# Patient Record
Sex: Female | Born: 1940
Health system: Southern US, Community
[De-identification: ages and names within clinical notes are randomized; demographics above are authoritative.]

## PROBLEM LIST (undated history)

## (undated) DIAGNOSIS — N189 Chronic kidney disease, unspecified: Secondary | ICD-10-CM

## (undated) DIAGNOSIS — F419 Anxiety disorder, unspecified: Secondary | ICD-10-CM

## (undated) DIAGNOSIS — R112 Nausea with vomiting, unspecified: Secondary | ICD-10-CM

## (undated) DIAGNOSIS — J189 Pneumonia, unspecified organism: Secondary | ICD-10-CM

## (undated) DIAGNOSIS — Z9889 Other specified postprocedural states: Secondary | ICD-10-CM

## (undated) DIAGNOSIS — R413 Other amnesia: Secondary | ICD-10-CM

## (undated) DIAGNOSIS — I6529 Occlusion and stenosis of unspecified carotid artery: Secondary | ICD-10-CM

## (undated) DIAGNOSIS — F32A Depression, unspecified: Secondary | ICD-10-CM

## (undated) DIAGNOSIS — F329 Major depressive disorder, single episode, unspecified: Secondary | ICD-10-CM

## (undated) DIAGNOSIS — M1990 Unspecified osteoarthritis, unspecified site: Secondary | ICD-10-CM

## (undated) DIAGNOSIS — R0600 Dyspnea, unspecified: Secondary | ICD-10-CM

## (undated) DIAGNOSIS — F0390 Unspecified dementia without behavioral disturbance: Secondary | ICD-10-CM

## (undated) DIAGNOSIS — I10 Essential (primary) hypertension: Secondary | ICD-10-CM

## (undated) DIAGNOSIS — J449 Chronic obstructive pulmonary disease, unspecified: Secondary | ICD-10-CM

## (undated) DIAGNOSIS — R011 Cardiac murmur, unspecified: Secondary | ICD-10-CM

## (undated) DIAGNOSIS — D649 Anemia, unspecified: Secondary | ICD-10-CM

## (undated) DIAGNOSIS — K635 Polyp of colon: Secondary | ICD-10-CM

## (undated) HISTORY — DX: Other amnesia: R41.3

## (undated) HISTORY — PX: CHOLECYSTECTOMY: SHX55

## (undated) HISTORY — PX: CAROTID ENDARTERECTOMY: SUR193

## (undated) HISTORY — DX: Occlusion and stenosis of unspecified carotid artery: I65.29

---

## 1997-10-03 ENCOUNTER — Other Ambulatory Visit: Admission: RE | Admit: 1997-10-03 | Discharge: 1997-10-03 | Payer: Self-pay | Admitting: Obstetrics and Gynecology

## 2000-09-22 ENCOUNTER — Ambulatory Visit (HOSPITAL_COMMUNITY): Admission: RE | Admit: 2000-09-22 | Discharge: 2000-09-22 | Payer: Self-pay | Admitting: Family Medicine

## 2000-09-22 ENCOUNTER — Encounter: Payer: Self-pay | Admitting: Family Medicine

## 2000-12-29 ENCOUNTER — Encounter: Payer: Self-pay | Admitting: Family Medicine

## 2000-12-29 ENCOUNTER — Ambulatory Visit (HOSPITAL_COMMUNITY): Admission: RE | Admit: 2000-12-29 | Discharge: 2000-12-29 | Payer: Self-pay | Admitting: Family Medicine

## 2001-01-08 ENCOUNTER — Ambulatory Visit (HOSPITAL_COMMUNITY): Admission: RE | Admit: 2001-01-08 | Discharge: 2001-01-08 | Payer: Self-pay | Admitting: Internal Medicine

## 2001-01-27 ENCOUNTER — Encounter: Payer: Self-pay | Admitting: Internal Medicine

## 2001-01-27 ENCOUNTER — Ambulatory Visit (HOSPITAL_COMMUNITY): Admission: RE | Admit: 2001-01-27 | Discharge: 2001-01-27 | Payer: Self-pay | Admitting: Internal Medicine

## 2002-02-28 ENCOUNTER — Encounter: Payer: Self-pay | Admitting: Obstetrics and Gynecology

## 2002-02-28 ENCOUNTER — Ambulatory Visit (HOSPITAL_COMMUNITY): Admission: RE | Admit: 2002-02-28 | Discharge: 2002-02-28 | Payer: Self-pay | Admitting: Obstetrics and Gynecology

## 2002-09-12 ENCOUNTER — Encounter: Payer: Self-pay | Admitting: Internal Medicine

## 2002-09-12 ENCOUNTER — Ambulatory Visit (HOSPITAL_COMMUNITY): Admission: RE | Admit: 2002-09-12 | Discharge: 2002-09-12 | Payer: Self-pay | Admitting: Internal Medicine

## 2002-09-13 ENCOUNTER — Ambulatory Visit (HOSPITAL_COMMUNITY): Admission: RE | Admit: 2002-09-13 | Discharge: 2002-09-13 | Payer: Self-pay | Admitting: Internal Medicine

## 2002-09-13 ENCOUNTER — Encounter: Payer: Self-pay | Admitting: Internal Medicine

## 2003-10-24 ENCOUNTER — Ambulatory Visit (HOSPITAL_COMMUNITY): Admission: RE | Admit: 2003-10-24 | Discharge: 2003-10-24 | Payer: Self-pay | Admitting: Family Medicine

## 2004-02-29 ENCOUNTER — Ambulatory Visit (HOSPITAL_COMMUNITY): Admission: RE | Admit: 2004-02-29 | Discharge: 2004-02-29 | Payer: Self-pay | Admitting: Obstetrics and Gynecology

## 2004-11-12 ENCOUNTER — Ambulatory Visit (HOSPITAL_COMMUNITY): Admission: RE | Admit: 2004-11-12 | Discharge: 2004-11-12 | Payer: Self-pay | Admitting: Family Medicine

## 2005-07-10 ENCOUNTER — Ambulatory Visit (HOSPITAL_COMMUNITY): Admission: RE | Admit: 2005-07-10 | Discharge: 2005-07-10 | Payer: Self-pay | Admitting: Family Medicine

## 2005-12-16 ENCOUNTER — Ambulatory Visit (HOSPITAL_COMMUNITY): Admission: RE | Admit: 2005-12-16 | Discharge: 2005-12-16 | Payer: Self-pay | Admitting: Family Medicine

## 2006-09-29 ENCOUNTER — Ambulatory Visit (HOSPITAL_COMMUNITY): Admission: RE | Admit: 2006-09-29 | Discharge: 2006-09-29 | Payer: Self-pay | Admitting: Obstetrics and Gynecology

## 2007-12-22 ENCOUNTER — Ambulatory Visit (HOSPITAL_COMMUNITY): Admission: RE | Admit: 2007-12-22 | Discharge: 2007-12-22 | Payer: Self-pay | Admitting: Family Medicine

## 2009-06-27 ENCOUNTER — Ambulatory Visit (HOSPITAL_COMMUNITY): Admission: RE | Admit: 2009-06-27 | Discharge: 2009-06-27 | Payer: Self-pay | Admitting: Family Medicine

## 2009-09-17 ENCOUNTER — Ambulatory Visit (HOSPITAL_COMMUNITY): Admission: RE | Admit: 2009-09-17 | Discharge: 2009-09-17 | Payer: Self-pay | Admitting: Internal Medicine

## 2010-03-05 ENCOUNTER — Ambulatory Visit (HOSPITAL_COMMUNITY): Admission: RE | Admit: 2010-03-05 | Discharge: 2010-03-05 | Payer: Self-pay | Admitting: Obstetrics and Gynecology

## 2010-04-23 ENCOUNTER — Emergency Department (HOSPITAL_COMMUNITY)
Admission: EM | Admit: 2010-04-23 | Discharge: 2010-04-23 | Payer: Self-pay | Source: Home / Self Care | Admitting: Emergency Medicine

## 2010-08-06 LAB — DIFFERENTIAL
Basophils Absolute: 0 10*3/uL (ref 0.0–0.1)
Eosinophils Absolute: 0 10*3/uL (ref 0.0–0.7)
Eosinophils Relative: 1 % (ref 0–5)
Lymphocytes Relative: 28 % (ref 12–46)
Neutrophils Relative %: 62 % (ref 43–77)

## 2010-08-06 LAB — CBC
HCT: 38.4 % (ref 36.0–46.0)
Hemoglobin: 13.8 g/dL (ref 12.0–15.0)
MCH: 31.2 pg (ref 26.0–34.0)
MCV: 86.7 fL (ref 78.0–100.0)
RBC: 4.43 MIL/uL (ref 3.87–5.11)

## 2010-08-06 LAB — COMPREHENSIVE METABOLIC PANEL
AST: 21 U/L (ref 0–37)
CO2: 22 mEq/L (ref 19–32)
Chloride: 106 mEq/L (ref 96–112)
Creatinine, Ser: 0.75 mg/dL (ref 0.4–1.2)
GFR calc Af Amer: >60 mL/min (ref >60)
GFR calc non Af Amer: >60 mL/min (ref >60)
Glucose, Bld: 100 mg/dL — ABNORMAL HIGH (ref 70–99)
Total Bilirubin: 0.7 mg/dL (ref 0.3–1.2)

## 2010-08-06 LAB — POCT CARDIAC MARKERS
CKMB, poc: <1 ng/mL — ABNORMAL LOW (ref 1.0–8.0)
Troponin i, poc: <0.05 ng/mL (ref 0.00–0.09)
Troponin i, poc: <0.05 ng/mL (ref 0.00–0.09)

## 2011-04-03 ENCOUNTER — Other Ambulatory Visit (HOSPITAL_COMMUNITY): Payer: Self-pay | Admitting: Family Medicine

## 2011-04-03 DIAGNOSIS — Z139 Encounter for screening, unspecified: Secondary | ICD-10-CM

## 2011-04-10 ENCOUNTER — Ambulatory Visit (HOSPITAL_COMMUNITY)
Admission: RE | Admit: 2011-04-10 | Discharge: 2011-04-10 | Disposition: A | Discharge status: Home / Self Care | Payer: Medicare Other | Source: Ambulatory Visit | Attending: Family Medicine | Admitting: Family Medicine

## 2011-04-10 DIAGNOSIS — Z1231 Encounter for screening mammogram for malignant neoplasm of breast: Secondary | ICD-10-CM | POA: Insufficient documentation

## 2011-04-10 DIAGNOSIS — Z139 Encounter for screening, unspecified: Secondary | ICD-10-CM

## 2011-06-10 ENCOUNTER — Encounter: Payer: Self-pay | Admitting: Internal Medicine

## 2011-09-30 ENCOUNTER — Other Ambulatory Visit (HOSPITAL_COMMUNITY): Payer: Self-pay | Admitting: Family Medicine

## 2011-09-30 DIAGNOSIS — Z01419 Encounter for gynecological examination (general) (routine) without abnormal findings: Secondary | ICD-10-CM

## 2011-09-30 DIAGNOSIS — Z139 Encounter for screening, unspecified: Secondary | ICD-10-CM

## 2011-10-03 ENCOUNTER — Encounter (INDEPENDENT_AMBULATORY_CARE_PROVIDER_SITE_OTHER): Payer: Self-pay | Admitting: *Deleted

## 2011-10-07 ENCOUNTER — Ambulatory Visit (HOSPITAL_COMMUNITY)
Admission: RE | Admit: 2011-10-07 | Discharge: 2011-10-07 | Disposition: A | Discharge status: Home / Self Care | Payer: Medicare Other | Source: Ambulatory Visit | Attending: Family Medicine | Admitting: Family Medicine

## 2011-10-07 DIAGNOSIS — M899 Disorder of bone, unspecified: Secondary | ICD-10-CM | POA: Insufficient documentation

## 2011-10-07 DIAGNOSIS — Z13820 Encounter for screening for osteoporosis: Secondary | ICD-10-CM | POA: Insufficient documentation

## 2011-10-07 DIAGNOSIS — Z01419 Encounter for gynecological examination (general) (routine) without abnormal findings: Secondary | ICD-10-CM

## 2011-10-07 DIAGNOSIS — Z139 Encounter for screening, unspecified: Secondary | ICD-10-CM

## 2011-10-07 DIAGNOSIS — F17200 Nicotine dependence, unspecified, uncomplicated: Secondary | ICD-10-CM | POA: Insufficient documentation

## 2011-10-07 DIAGNOSIS — M949 Disorder of cartilage, unspecified: Secondary | ICD-10-CM | POA: Insufficient documentation

## 2011-10-07 DIAGNOSIS — Z780 Asymptomatic menopausal state: Secondary | ICD-10-CM | POA: Insufficient documentation

## 2011-10-15 ENCOUNTER — Encounter (INDEPENDENT_AMBULATORY_CARE_PROVIDER_SITE_OTHER): Payer: Self-pay | Admitting: *Deleted

## 2011-10-15 ENCOUNTER — Telehealth (INDEPENDENT_AMBULATORY_CARE_PROVIDER_SITE_OTHER): Payer: Self-pay | Admitting: *Deleted

## 2011-10-15 ENCOUNTER — Other Ambulatory Visit (INDEPENDENT_AMBULATORY_CARE_PROVIDER_SITE_OTHER): Payer: Self-pay | Admitting: *Deleted

## 2011-10-15 DIAGNOSIS — Z1211 Encounter for screening for malignant neoplasm of colon: Secondary | ICD-10-CM

## 2011-10-15 MED ORDER — PEG-KCL-NACL-NASULF-NA ASC-C 100 G PO SOLR: 1.0000 | Freq: Once | ORAL | Status: DC

## 2011-10-15 NOTE — Telephone Encounter (Signed)
Patient needs movi prep 

## 2011-10-21 ENCOUNTER — Telehealth (INDEPENDENT_AMBULATORY_CARE_PROVIDER_SITE_OTHER): Payer: Self-pay | Admitting: *Deleted

## 2011-10-21 NOTE — Telephone Encounter (Signed)
PCP/Requesting MD: [email or web address]  Name & DOB: Sophia Coleman   08/25/1940     Procedure: tcs  Reason/Indication:  screening  Has patient had this procedure before?  yes  If so, when, by whom and where?  11 yrs ago  Is there a family history of colon cancer?  no  Who?  What age when diagnosed?    Is patient diabetic?   no      Does patient have prosthetic heart valve?  no  Do you have a pacemaker?  no  Has patient had joint replacement within last 12 months?  no  Is patient on Coumadin, Plavix and/or Aspirin? yes  Medications: asa 81 mg daily, losartan 100 mg daily, estradiol 5 mg daily, medroxyprogesterone 2.5 mg daily, paxil (generic) 20 mg every other day, alendronate 70 mg 1 tab weekly  Allergies: levaquin, pequin, augmentin  Medication Adjustment: asa 2 days  Procedure date & time: 11/13/11 at 1030

## 2011-10-23 NOTE — Telephone Encounter (Signed)
agree

## 2011-11-06 ENCOUNTER — Telehealth (INDEPENDENT_AMBULATORY_CARE_PROVIDER_SITE_OTHER): Payer: Self-pay | Admitting: *Deleted

## 2011-11-06 ENCOUNTER — Encounter (HOSPITAL_COMMUNITY): Payer: Self-pay | Admitting: Pharmacy Technician

## 2011-11-06 DIAGNOSIS — Z1211 Encounter for screening for malignant neoplasm of colon: Secondary | ICD-10-CM

## 2011-11-06 NOTE — Telephone Encounter (Signed)
Patient states pharmacy didn't get before, please resend

## 2011-11-07 MED ORDER — PEG-KCL-NACL-NASULF-NA ASC-C 100 G PO SOLR: 1.0000 | Freq: Once | ORAL | Status: DC

## 2011-11-12 MED ORDER — SODIUM CHLORIDE 0.45 % IV SOLN
Freq: Once | INTRAVENOUS | Status: AC
  Administered 2011-11-13: 1000 mL via INTRAVENOUS

## 2011-11-13 ENCOUNTER — Encounter (HOSPITAL_COMMUNITY): Payer: Self-pay | Admitting: *Deleted

## 2011-11-13 ENCOUNTER — Ambulatory Visit (HOSPITAL_COMMUNITY)
Admission: RE | Admit: 2011-11-13 | Discharge: 2011-11-13 | Disposition: A | Discharge status: Home Health | Payer: Medicare Other | Source: Ambulatory Visit | Attending: Internal Medicine | Admitting: Internal Medicine

## 2011-11-13 ENCOUNTER — Encounter (HOSPITAL_COMMUNITY)
Admission: RE | Disposition: A | Discharge status: Home Health | Payer: Self-pay | Source: Ambulatory Visit | Attending: Internal Medicine

## 2011-11-13 DIAGNOSIS — Z1211 Encounter for screening for malignant neoplasm of colon: Secondary | ICD-10-CM | POA: Insufficient documentation

## 2011-11-13 DIAGNOSIS — Z7982 Long term (current) use of aspirin: Secondary | ICD-10-CM | POA: Insufficient documentation

## 2011-11-13 DIAGNOSIS — Q2733 Arteriovenous malformation of digestive system vessel: Secondary | ICD-10-CM | POA: Insufficient documentation

## 2011-11-13 DIAGNOSIS — K5730 Diverticulosis of large intestine without perforation or abscess without bleeding: Secondary | ICD-10-CM

## 2011-11-13 DIAGNOSIS — D126 Benign neoplasm of colon, unspecified: Secondary | ICD-10-CM

## 2011-11-13 DIAGNOSIS — J4489 Other specified chronic obstructive pulmonary disease: Secondary | ICD-10-CM | POA: Insufficient documentation

## 2011-11-13 DIAGNOSIS — Z79899 Other long term (current) drug therapy: Secondary | ICD-10-CM | POA: Insufficient documentation

## 2011-11-13 DIAGNOSIS — J449 Chronic obstructive pulmonary disease, unspecified: Secondary | ICD-10-CM | POA: Insufficient documentation

## 2011-11-13 DIAGNOSIS — I10 Essential (primary) hypertension: Secondary | ICD-10-CM | POA: Insufficient documentation

## 2011-11-13 HISTORY — DX: Depression, unspecified: F32.A

## 2011-11-13 HISTORY — DX: Chronic kidney disease, unspecified: N18.9

## 2011-11-13 HISTORY — DX: Essential (primary) hypertension: I10

## 2011-11-13 HISTORY — DX: Unspecified osteoarthritis, unspecified site: M19.90

## 2011-11-13 HISTORY — DX: Major depressive disorder, single episode, unspecified: F32.9

## 2011-11-13 HISTORY — PX: COLONOSCOPY: SHX5424

## 2011-11-13 HISTORY — DX: Chronic obstructive pulmonary disease, unspecified: J44.9

## 2011-11-13 SURGERY — COLONOSCOPY
Anesthesia: Moderate Sedation

## 2011-11-13 MED ORDER — MIDAZOLAM HCL 5 MG/5ML IJ SOLN
INTRAMUSCULAR | Status: AC
  Filled 2011-11-13: qty 10

## 2011-11-13 MED ORDER — STERILE WATER FOR IRRIGATION IR SOLN
Status: DC | PRN
  Administered 2011-11-13: 11:00:00

## 2011-11-13 MED ORDER — MEPERIDINE HCL 50 MG/ML IJ SOLN
INTRAMUSCULAR | Status: AC
  Filled 2011-11-13: qty 1

## 2011-11-13 MED ORDER — MEPERIDINE HCL 50 MG/ML IJ SOLN
INTRAMUSCULAR | Status: DC | PRN
  Administered 2011-11-13 (×2): 25 mg via INTRAVENOUS

## 2011-11-13 MED ORDER — MIDAZOLAM HCL 5 MG/5ML IJ SOLN
INTRAMUSCULAR | Status: DC | PRN
  Administered 2011-11-13: 1 mg via INTRAVENOUS
  Administered 2011-11-13 (×2): 2 mg via INTRAVENOUS

## 2011-11-13 NOTE — H&P (Signed)
Sophia Coleman is an 71 y.o. female.   Chief Complaint: Patient is here for colonoscopy. HPI: Patient is 71 year old Caucasian female who is here for average risk screening colonoscopy. Patient's last exam was over 10 years ago. She denies abdominal pain change in her bowel habits or rectal bleeding. Her she did notice some bright blood after she finished prep. Family history is negative for colorectal carcinoma.  Past Medical History  Diagnosis Date  . Hypertension   . COPD (chronic obstructive pulmonary disease)   . Chronic kidney disease   . Depression   . Arthritis     Past Surgical History  Procedure Date  . Cholecystectomy     History reviewed. No pertinent family history. Social History:  reports that she has been smoking.  She does not have any smokeless tobacco history on file. She reports that she does not drink alcohol or use illicit drugs.  Allergies:  Allergies  Allergen Reactions  . Augmentin (Amoxicillin-Pot Clavulanate) Other (See Comments)    Reaction: severe sweats  . Quinolones Nausea And Vomiting    Medications Prior to Admission  Medication Sig Dispense Refill  . alendronate (FOSAMAX) 70 MG tablet Take 70 mg by mouth every 7 (seven) days. Take with a full glass of water on an empty stomach. On mondays      . Calcium Carbonate-Vitamin D (CALTRATE 600+D) 600-400 MG-UNIT per tablet Take 1 tablet by mouth every morning.      Marland Kitchen estradiol (ESTRACE) 0.5 MG tablet Take 0.5 mg by mouth every morning.      Marland Kitchen ibuprofen (ADVIL,MOTRIN) 200 MG tablet Take 200 mg by mouth every 6 (six) hours as needed. For pain      . losartan (COZAAR) 100 MG tablet Take 100 mg by mouth every morning.      . medroxyPROGESTERone (PROVERA) 2.5 MG tablet Take 2.5 mg by mouth every morning.      Marland Kitchen PARoxetine (PAXIL) 20 MG tablet Take 20 mg by mouth every morning.      . peg 3350 powder (MOVIPREP) 100 G SOLR Take 1 kit (100 g total) by mouth once.  1 kit  0  . aspirin EC 81 MG tablet Take  81 mg by mouth every morning.        No results found for this or any previous visit (from the past 48 hour(s)). No results found.  ROS  Blood pressure 137/69, pulse 62, temperature 97.8 F (36.6 C), temperature source Oral, resp. rate 18, height 5' 1.5" (1.562 m), weight 97 lb (43.999 kg), SpO2 100.00%. Physical Exam  Constitutional: She appears well-developed.       Thin Caucasian female in NAD  HENT:  Mouth/Throat: Oropharynx is clear and moist.  Eyes: Conjunctivae are normal. No scleral icterus.  Neck: No thyromegaly present.  Cardiovascular: Normal rate, regular rhythm and normal heart sounds.   No murmur heard. Respiratory: Effort normal and breath sounds normal.  GI: Soft. She exhibits no distension and no mass. There is no tenderness.  Musculoskeletal: She exhibits no edema.  Lymphadenopathy:    She has no cervical adenopathy.  Neurological: She is alert.  Skin: Skin is warm and dry.     Assessment/Plan Average risk screening colonoscopy.  Sophia Coleman U 11/13/2011, 10:53 AM

## 2011-11-13 NOTE — Op Note (Signed)
COLONOSCOPY PROCEDURE REPORT  PATIENT:  Sophia Coleman  MR#:  [id number] Birthdate:  [birth year], 71 y.o., female Endoscopist:  Dr. Malissa Hippo, MD Referred By:  Dr. Colette Ribas, MD Procedure Date: 11/13/2011  Procedure:   Colonoscopy with snare polypectomy.  Indications:  Screening for colon cancer. Since last colonoscopy was over 10 years ago.  Informed Consent:  The procedure and risks were reviewed with the patient and informed consent was obtained.  Medications:  Demerol 50 mg IV Versed 5 mg IV  Description of procedure:  After a digital rectal exam was performed, that colonoscope was advanced from the anus through the rectum and colon to the area of the cecum, ileocecal valve and appendiceal orifice. The cecum was deeply intubated. These structures were well-seen and photographed for the record. From the level of the cecum and ileocecal valve, the scope was slowly and cautiously withdrawn. The mucosal surfaces were carefully surveyed utilizing scope tip to flexion to facilitate fold flattening as needed. The scope was pulled down into the rectum where a thorough exam including retroflexion was performed.  Findings:    prep satisfactory. Few diverticula at sigmoid colon. 8 cecal AV malformations without stigmata of bleeding. Is her left alone. Wall polyp ablated via cold biopsy from proximal sigmoid colon. 11 mm pedunculated polyp snared from distal sigmoid colon. Normal rectal mucosa and anal rectal junction.  Therapeutic/Diagnostic Maneuvers Performed:  See above  Complications:  None  Cecal Withdrawal Time:  9 minutes  Impression:  Examination performed to cecum. Multiple nonbleeding cecal AV malformations which are left alone. Few diverticula at sigmoid colon. 11 mm pedunculated polyp snared from distal sigmoid colon. Small polyp ablated via cold biopsy from proximal sigmoid colon.  Recommendations:  Standard instructions given. I will contact patient with  results of biopsy and further recommendations.  Nyra Anspaugh U  11/13/2011 11:47 AM  CC: Dr. Colette Ribas, MD & Dr. Bonnetta Barry ref. provider found

## 2011-11-13 NOTE — Discharge Instructions (Signed)
No aspirin or NSAIDs for one week. Resume other medications as before. High fiber diet. No driving for 24 hours. Physician will contact you with biopsy results.  Colonoscopy Care After Read the instructions outlined below and refer to this sheet in the next few weeks. These discharge instructions provide you with general information on caring for yourself after you leave the hospital. Your doctor may also give you specific instructions. While your treatment has been planned according to the most current medical practices available, unavoidable complications occasionally occur. If you have any problems or questions after discharge, call your doctor. HOME CARE INSTRUCTIONS ACTIVITY:  You may resume your regular activity, but move at a slower pace for the next 24 hours.   Take frequent rest periods for the next 24 hours.   Walking will help get rid of the air and reduce the bloated feeling in your belly (abdomen).   No driving for 24 hours (because of the medicine (anesthesia) used during the test).   You may shower.   Do not sign any important legal documents or operate any machinery for 24 hours (because of the anesthesia used during the test).  NUTRITION:  Drink plenty of fluids.   You may resume your normal diet as instructed by your doctor.   Begin with a light meal and progress to your normal diet. Heavy or fried foods are harder to digest and may make you feel sick to your stomach (nauseated).   Avoid alcoholic beverages for 24 hours or as instructed.  MEDICATIONS:  You may resume your normal medications unless your doctor tells you otherwise.  WHAT TO EXPECT TODAY:  Some feelings of bloating in the abdomen.   Passage of more gas than usual.   Spotting of blood in your stool or on the toilet paper.  IF YOU HAD POLYPS REMOVED DURING THE COLONOSCOPY:  No aspirin products for 7 days or as instructed.   No alcohol for 7 days or as instructed.   Eat a soft diet for the  next 24 hours.  FINDING OUT THE RESULTS OF YOUR TEST Not all test results are available during your visit. If your test results are not back during the visit, make an appointment with your caregiver to find out the results. Do not assume everything is normal if you have not heard from your caregiver or the medical facility. It is important for you to follow up on all of your test results.  SEEK IMMEDIATE MEDICAL CARE IF:  You have more than a spotting of blood in your stool.   Your belly is swollen (abdominal distention).   You are nauseated or vomiting.   You have a fever.   You have abdominal pain or discomfort that is severe or gets worse throughout the day.  Document Released: 12/25/2003 Document Revised: 05/01/2011 Document Reviewed: 12/23/2007 [facility] Patient Information 2012 [place], [place].  High Fiber Diet A high fiber diet changes your normal diet to include more whole grains, legumes, fruits, and vegetables. Changes in the diet involve replacing refined carbohydrates with unrefined foods. The calorie level of the diet is essentially unchanged. The Dietary Reference Intake (recommended amount) for adult males is 38 g per day. For adult females, it is 25 g per day. Pregnant and lactating women should consume 28 g of fiber per day. Fiber is the intact part of a plant that is not broken down during digestion. Functional fiber is fiber that has been isolated from the plant to provide a beneficial effect in  the body. PURPOSE  Increase stool bulk.   Ease and regulate bowel movements.   Lower cholesterol.  INDICATIONS THAT YOU NEED MORE FIBER  Constipation and hemorrhoids.   Uncomplicated diverticulosis (intestine condition) and irritable bowel syndrome.   Weight management.   As a protective measure against hardening of the arteries (atherosclerosis), diabetes, and cancer.  NOTE OF CAUTION If you have a digestive or bowel problem, ask your caregiver for advice before adding  high fiber foods to your diet. Some of the following medical problems are such that a high fiber diet should not be used without consulting your caregiver:  Acute diverticulitis (intestine infection).   Partial small bowel obstructions.   Complicated diverticular disease involving bleeding, rupture (perforation), or abscess (boil, furuncle).   Presence of autonomic neuropathy (nerve damage) or gastric paresis (stomach cannot empty itself).  GUIDELINES FOR INCREASING FIBER  Start adding fiber to the diet slowly. A gradual increase of about 5 more grams (2 slices of whole-wheat bread, 2 servings of most fruits or vegetables, or 1 bowl of high fiber cereal) per day is best. Too rapid an increase in fiber may result in constipation, flatulence, and bloating.   Drink enough water and fluids to keep your urine clear or pale yellow. Water, juice, or caffeine-free drinks are recommended. Not drinking enough fluid may cause constipation.   Eat a variety of high fiber foods rather than one type of fiber.   Try to increase your intake of fiber through using high fiber foods rather than fiber pills or supplements that contain small amounts of fiber.   The goal is to change the types of food eaten. Do not supplement your present diet with high fiber foods, but replace foods in your present diet.  INCLUDE A VARIETY OF FIBER SOURCES  Replace refined and processed grains with whole grains, canned fruits with fresh fruits, and incorporate other fiber sources. White rice, white breads, and most bakery goods contain little or no fiber.   Brown whole-grain rice, buckwheat oats, and many fruits and vegetables are all good sources of fiber. These include: broccoli, Brussels sprouts, cabbage, cauliflower, beets, sweet potatoes, white potatoes (skin on), carrots, tomatoes, eggplant, squash, berries, fresh fruits, and dried fruits.   Cereals appear to be the richest source of fiber. Cereal fiber is found in whole  grains and bran. Bran is the fiber-rich outer coat of cereal grain, which is largely removed in refining. In whole-grain cereals, the bran remains. In breakfast cereals, the largest amount of fiber is found in those with "bran" in their names. The fiber content is sometimes indicated on the label.   You may need to include additional fruits and vegetables each day.   In baking, for 1 cup white flour, you may use the following substitutions:   1 cup whole-wheat flour minus 2 tbs.    cup white flour plus  cup whole-wheat flour.  Document Released: 05/12/2005 Document Revised: 05/01/2011 Document Reviewed: 03/20/2009 [facility] Patient Information 2012 [place], [place].  Colon Polyps A polyp is extra tissue that grows inside your body. Colon polyps grow in the large intestine. The large intestine, also called the colon, is part of your digestive system. It is a long, hollow tube at the end of your digestive tract where your body makes and stores stool. Most polyps are not dangerous. They are benign. This means they are not cancerous. But over time, some types of polyps can turn into cancer. Polyps that are smaller than a pea are usually  not harmful. But larger polyps could someday become or may already be cancerous. To be safe, doctors remove all polyps and test them.  WHO GETS POLYPS? Anyone can get polyps, but certain people are more likely than others. You may have a greater chance of getting polyps if:  You are over 50.   You have had polyps before.   Someone in your family has had polyps.   Someone in your family has had cancer of the large intestine.   Find out if someone in your family has had polyps. You may also be more likely to get polyps if you:   Eat a lot of fatty foods.   Smoke.   Drink alcohol.   Do not exercise.   Eat too much.  SYMPTOMS  Most small polyps do not cause symptoms. People often do not know they have one until their caregiver finds it during a regular  checkup or while testing them for something else. Some people do have symptoms like these:  Bleeding from the anus. You might notice blood on your underwear or on toilet paper after you have had a bowel movement.   Constipation or diarrhea that lasts more than a week.   Blood in the stool. Blood can make stool look black or it can show up as red streaks in the stool.  If you have any of these symptoms, see your caregiver. HOW DOES THE DOCTOR TEST FOR POLYPS? The doctor can use four tests to check for polyps:  Digital rectal exam. The caregiver wears gloves and checks your rectum (the last part of the large intestine) to see if it feels normal. This test would find polyps only in the rectum. Your caregiver may need to do one of the other tests listed below to find polyps higher up in the intestine.   Barium enema. The caregiver puts a liquid called barium into your rectum before taking x-rays of your large intestine. Barium makes your intestine look white in the pictures. Polyps are dark, so they are easy to see.   Sigmoidoscopy. With this test, the caregiver can see inside your large intestine. A thin flexible tube is placed into your rectum. The device is called a sigmoidoscope, which has a light and a tiny video camera in it. The caregiver uses the sigmoidoscope to look at the last third of your large intestine.   Colonoscopy. This test is like sigmoidoscopy, but the caregiver looks at all of the large intestine. It usually requires sedation. This is the most common method for finding and removing polyps.  TREATMENT   The caregiver will remove the polyp during sigmoidoscopy or colonoscopy. The polyp is then tested for cancer.   If you have had polyps, your caregiver may want you to get tested regularly in the future.  PREVENTION  There is not one sure way to prevent polyps. You might be able to lower your risk of getting them if you:  Eat more fruits and vegetables and less fatty food.     Do not smoke.   Avoid alcohol.   Exercise every day.   Lose weight if you are overweight.   Eating more calcium and folate can also lower your risk of getting polyps. Some foods that are rich in calcium are milk, cheese, and broccoli. Some foods that are rich in folate are chickpeas, kidney beans, and spinach.   Aspirin might help prevent polyps. Studies are under way.  Document Released: 02/06/2004 Document Revised: 05/01/2011 Document Reviewed: 07/14/2007  [employer] Patient Information 2012 [place].

## 2011-11-17 ENCOUNTER — Encounter (HOSPITAL_COMMUNITY): Payer: Self-pay | Admitting: Internal Medicine

## 2011-11-18 ENCOUNTER — Other Ambulatory Visit: Payer: Self-pay | Admitting: Urology

## 2011-11-18 ENCOUNTER — Encounter (INDEPENDENT_AMBULATORY_CARE_PROVIDER_SITE_OTHER): Payer: Self-pay | Admitting: *Deleted

## 2011-11-18 ENCOUNTER — Ambulatory Visit (INDEPENDENT_AMBULATORY_CARE_PROVIDER_SITE_OTHER): Payer: Medicare Other | Admitting: Urology

## 2011-11-18 DIAGNOSIS — R319 Hematuria, unspecified: Secondary | ICD-10-CM

## 2011-11-25 ENCOUNTER — Ambulatory Visit (HOSPITAL_COMMUNITY)
Admission: RE | Admit: 2011-11-25 | Discharge: 2011-11-25 | Disposition: A | Discharge status: Home / Self Care | Payer: Medicare Other | Source: Ambulatory Visit | Attending: Urology | Admitting: Urology

## 2011-11-25 DIAGNOSIS — R9389 Abnormal findings on diagnostic imaging of other specified body structures: Secondary | ICD-10-CM | POA: Insufficient documentation

## 2011-11-25 DIAGNOSIS — N200 Calculus of kidney: Secondary | ICD-10-CM | POA: Insufficient documentation

## 2011-11-25 DIAGNOSIS — R319 Hematuria, unspecified: Secondary | ICD-10-CM | POA: Insufficient documentation

## 2011-11-25 DIAGNOSIS — J438 Other emphysema: Secondary | ICD-10-CM | POA: Insufficient documentation

## 2011-11-25 MED ORDER — IOHEXOL 300 MG/ML  SOLN
120.0000 mL | Freq: Once | INTRAMUSCULAR | Status: AC | PRN
  Administered 2011-11-25: 120 mL via INTRAVENOUS

## 2012-02-10 ENCOUNTER — Ambulatory Visit (INDEPENDENT_AMBULATORY_CARE_PROVIDER_SITE_OTHER): Payer: Medicare Other | Admitting: Urology

## 2012-02-10 DIAGNOSIS — R319 Hematuria, unspecified: Secondary | ICD-10-CM

## 2012-02-10 DIAGNOSIS — R911 Solitary pulmonary nodule: Secondary | ICD-10-CM

## 2012-02-23 ENCOUNTER — Other Ambulatory Visit (HOSPITAL_COMMUNITY): Payer: Self-pay | Admitting: Family Medicine

## 2012-02-23 DIAGNOSIS — Z139 Encounter for screening, unspecified: Secondary | ICD-10-CM

## 2012-04-12 ENCOUNTER — Ambulatory Visit (HOSPITAL_COMMUNITY)
Admission: RE | Admit: 2012-04-12 | Discharge: 2012-04-12 | Disposition: A | Discharge status: Home / Self Care | Payer: Medicare Other | Source: Ambulatory Visit | Attending: Family Medicine | Admitting: Family Medicine

## 2012-04-12 DIAGNOSIS — Z139 Encounter for screening, unspecified: Secondary | ICD-10-CM

## 2012-04-12 DIAGNOSIS — Z1231 Encounter for screening mammogram for malignant neoplasm of breast: Secondary | ICD-10-CM | POA: Insufficient documentation

## 2013-03-28 ENCOUNTER — Other Ambulatory Visit (HOSPITAL_COMMUNITY): Payer: Self-pay | Admitting: Family Medicine

## 2013-03-28 DIAGNOSIS — Z139 Encounter for screening, unspecified: Secondary | ICD-10-CM

## 2013-04-14 ENCOUNTER — Ambulatory Visit (HOSPITAL_COMMUNITY)
Admission: RE | Admit: 2013-04-14 | Discharge: 2013-04-14 | Disposition: A | Discharge status: Home / Self Care | Payer: Medicare Other | Source: Ambulatory Visit | Attending: Family Medicine | Admitting: Family Medicine

## 2013-04-14 DIAGNOSIS — Z1231 Encounter for screening mammogram for malignant neoplasm of breast: Secondary | ICD-10-CM | POA: Insufficient documentation

## 2013-04-14 DIAGNOSIS — Z139 Encounter for screening, unspecified: Secondary | ICD-10-CM

## 2013-04-19 ENCOUNTER — Other Ambulatory Visit: Payer: Self-pay | Admitting: Family Medicine

## 2013-04-19 DIAGNOSIS — R928 Other abnormal and inconclusive findings on diagnostic imaging of breast: Secondary | ICD-10-CM

## 2013-04-20 IMAGING — CT CT ABD-PEL WO/W CM
3 of 10 series · 11 of 46 positions shown, 17 images · IV contrast (omnipaque)
Comparison: None.

CLINICAL DATA: Hematuria diagnosed on physical exam in [REDACTED].
Frequent urination.  Prior cholecystectomy.  Right leg pain.
Chronic kidney disease.

CT ABDOMEN AND PELVIS WITHOUT AND WITH CONTRAST
TECHNIQUE: Multidetector CT imaging of the abdomen and pelvis was
performed without contrast material in one or both body regions,
followed by contrast material(s) and further sections in one or
both body regions.
Contrast: 120mL OMNIPAQUE IOHEXOL 300 MG/ML  SOLN

[Series 3: hematuria pre 5.0 b40f · axial · non-contrast · 0.62mm/px · z∈[-362,-258]mm · 3 of 74 slices shown]
[im 11/74  soft-tissue]
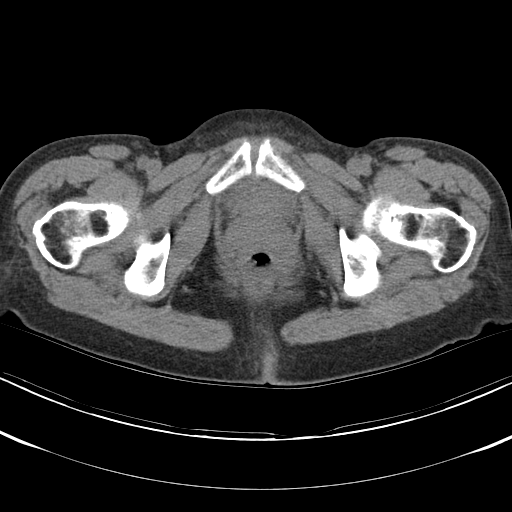
[im 21/74  soft-tissue]
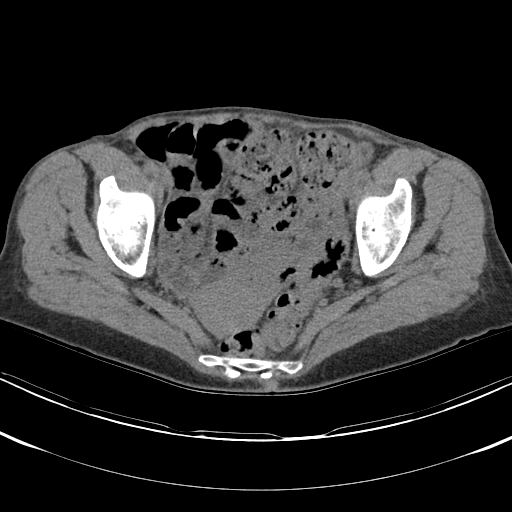
[im 32/74  soft-tissue]
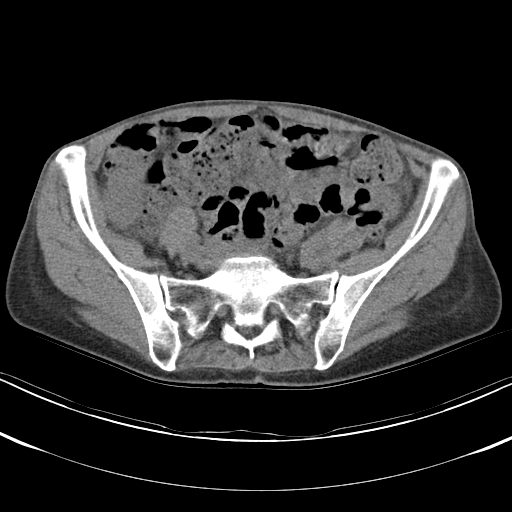

[Series 4: hematuria mpr pre coronal 3.0 · coronal · non-contrast · 0.58mm/px · 2 of 56 slices shown, 3 images]
[im 19/56  soft-tissue]
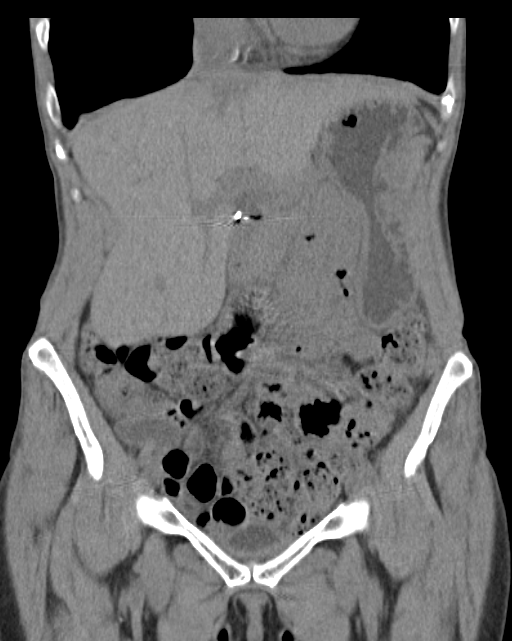
[im 19/56  bone]
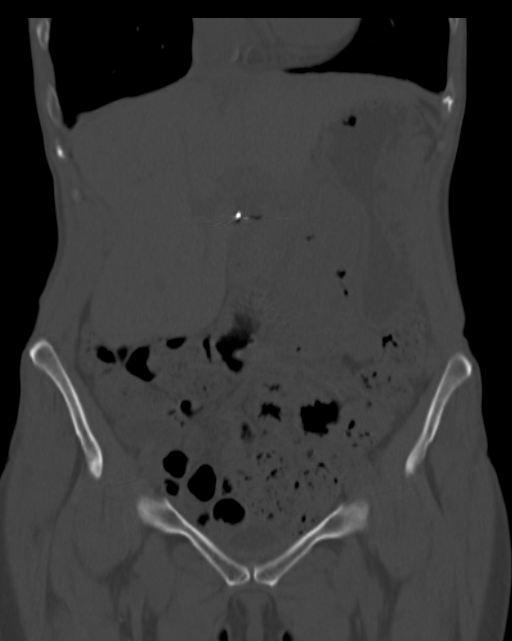
[im 37/56  soft-tissue]
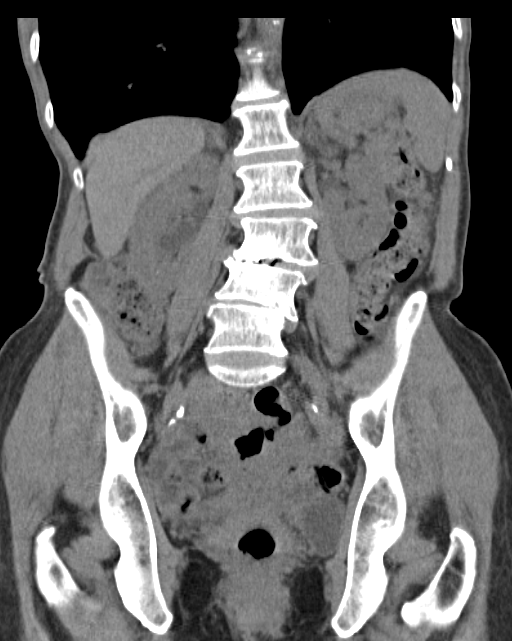

[Series 12: hematuria delay 5.0 b40f · axial · delayed · 0.69mm/px · z∈[-354,-80]mm · 6 of 78 slices shown, 11 images]
[im 12/78  soft-tissue]
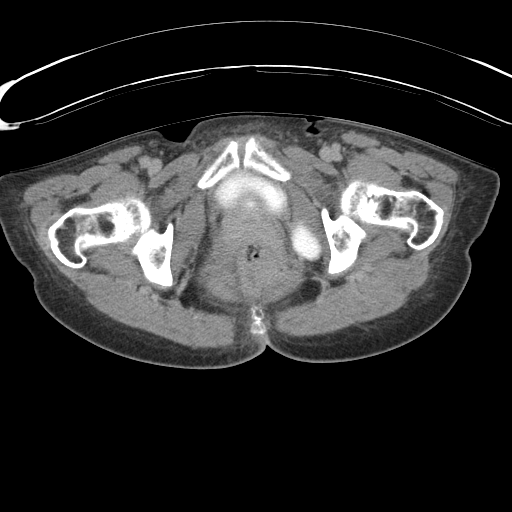
[im 12/78  bone]
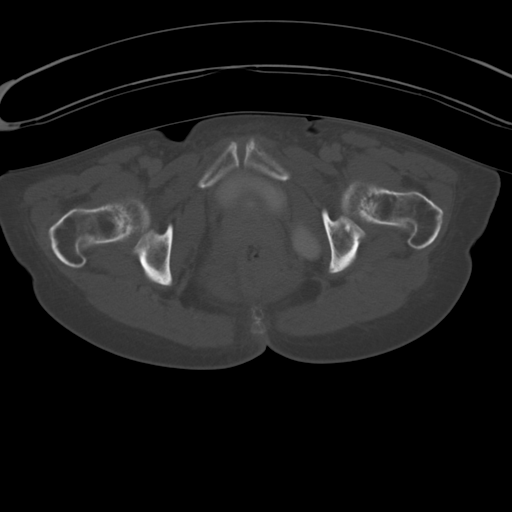
[im 23/78  soft-tissue]
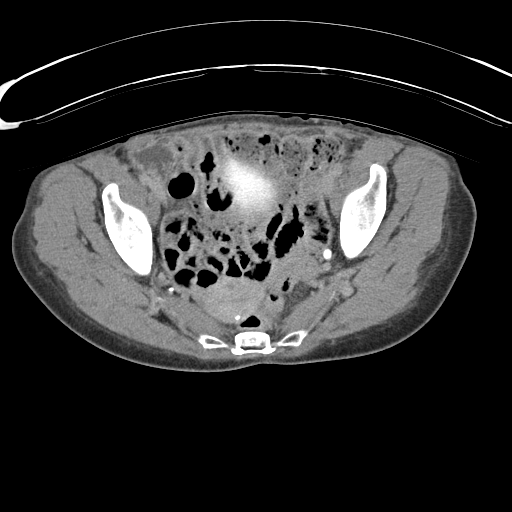
[im 34/78  soft-tissue]
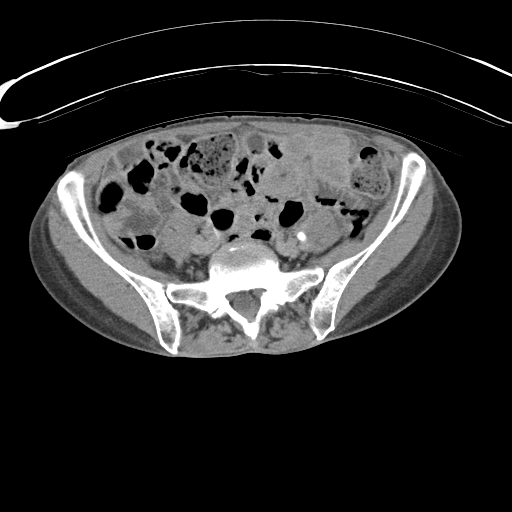
[im 34/78  lung]
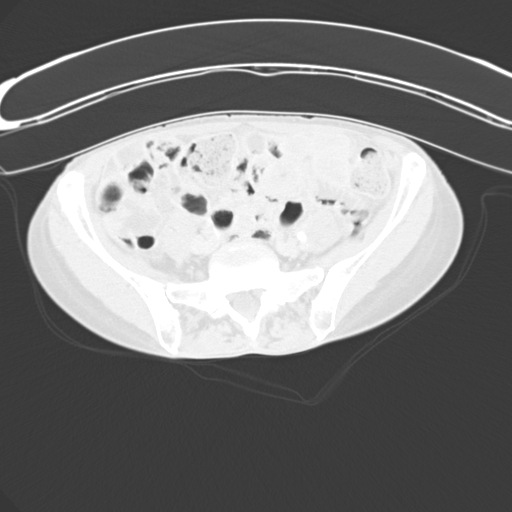
[im 45/78  soft-tissue]
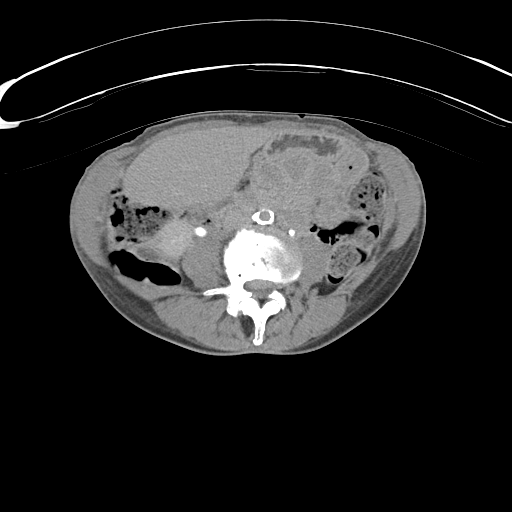
[im 45/78  lung]
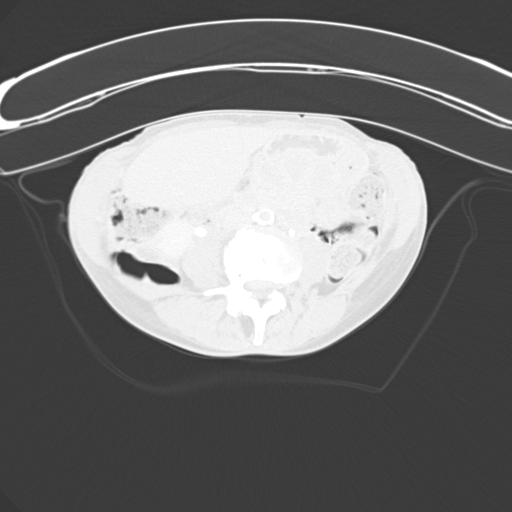
[im 56/78  soft-tissue]
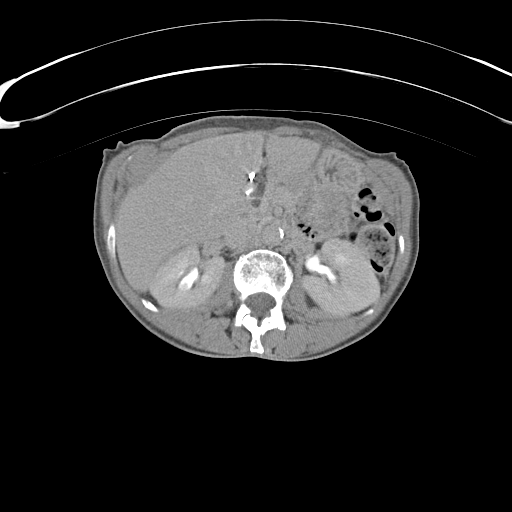
[im 56/78  lung]
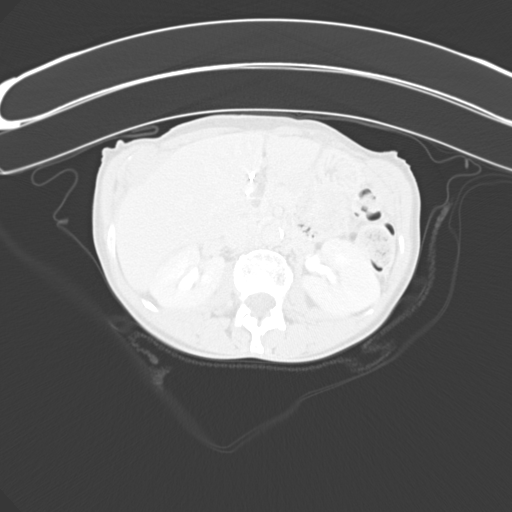
[im 67/78  soft-tissue]
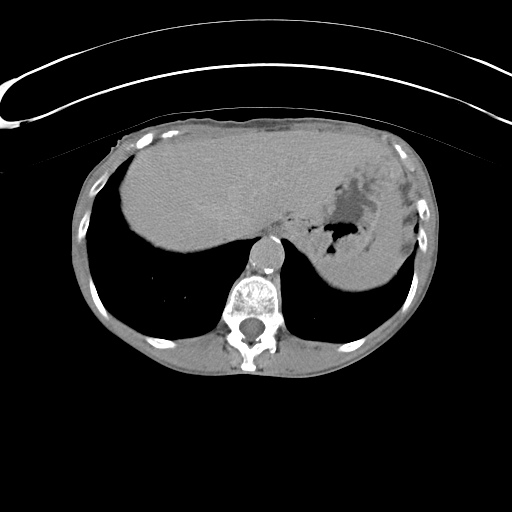
[im 67/78  lung]
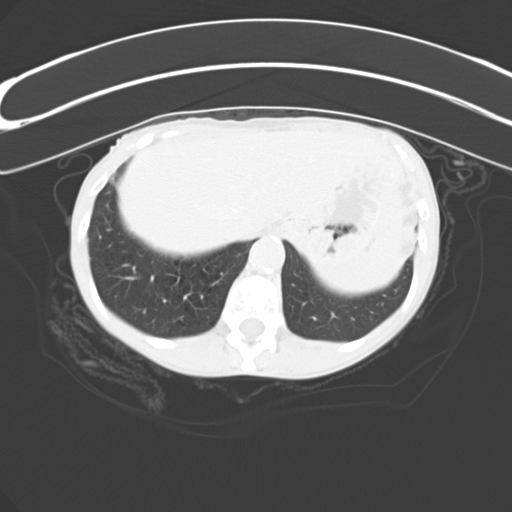

[11 of 46 positions shown; findings below may reference images not displayed]

FINDINGS: Unenhanced images demonstrate 5 mm left upper pole renal
collecting system calculus. Probable punctate interpolar right
renal collecting systems stone.  No hydronephrosis.  Paucity of
intra abdominal/pelvic fat degrades evaluation, including of the
ureters.  Given this factor, no hydroureter or ureteric stone
identified.

Post contrast images demonstrate scarring at the inferior right
middle lobe.  Centrilobular emphysema at the lung bases.  A 5 mm
right lower lobe lung nodule is identified on image 4 of series 13.

Heart size upper normal, without pericardial or pleural effusion.
There is right coronary artery atherosclerosis.  Normal, spleen,
stomach, pancreas. Possible too small to characterize anterior left
liver lobe lesion on image 10 of series 7.

Cholecystectomy without biliary ductal dilatation.  Normal adrenal
glands.

Inter/lower pole right renal cyst measures 7 mm.  No suspicious
renal mass on corticomedullary phase imaging.  Moderate renal
collecting system opacification bilaterally..  Portions of the mid
to distal right ureter are not well opacified on delayed images. No
filling defect identified.  Dense atherosclerosis at the origin of
bilateral renal arteries.  There is a probable circumaortic left
renal vein. No retroperitoneal or retrocrural adenopathy.

Colonic stool burden suggests constipation.

Normal small bowel without abdominal ascites.

  No pelvic adenopathy.  Normal urinary bladder.  Suspect small
uterine fibroids.  Example 8 mm within the fundus on image 49 of
series 7. No adnexal mass or significant free fluid.  No
significant free fluid.  No bladder filling defect on well
opacified delayed images.

Grade 1 L4-L5 anterolisthesis.  L3-L5 degenerative disc disease.
Prominent disc bulges at L2-L3 and L3-L4.
IMPRESSION: 1.  Left and probable right-sided renal collecting system calculi.
No hydronephrosis or ureteric stone.
2.   Emphysema with a 5 mm right lower lobe lung nodule. Given the
concurrent centrilobular emphysema, follow-up chest CT at 6 - 12
months is recommended.  This recommendation follows the consensus
statement: "Guidelines for Management of Small Pulmonary Nodules
Detected on CT Scans: A Statement from the [HOSPITAL]" as
[URL] These
results will be called to the ordering clinician or representative
by the Radiologist Assistant, and communication documented in the
PACS Dashboard.
3. Portions of right ureter incompletely opacified on delayed
images.  Otherwise, no other explanation for hematuria.
4. Possible constipation.
5.  Mildly degraded exam secondary paucity of abdominal pelvic fat.
6.  Probable uterine fibroids.

## 2013-05-11 ENCOUNTER — Ambulatory Visit (HOSPITAL_COMMUNITY)
Admission: RE | Admit: 2013-05-11 | Discharge: 2013-05-11 | Disposition: A | Discharge status: Home / Self Care | Payer: Medicare Other | Source: Ambulatory Visit | Attending: Family Medicine | Admitting: Family Medicine

## 2013-05-11 DIAGNOSIS — R928 Other abnormal and inconclusive findings on diagnostic imaging of breast: Secondary | ICD-10-CM

## 2013-10-31 ENCOUNTER — Other Ambulatory Visit (HOSPITAL_COMMUNITY): Payer: Self-pay | Admitting: Family Medicine

## 2013-10-31 DIAGNOSIS — Z0000 Encounter for general adult medical examination without abnormal findings: Secondary | ICD-10-CM

## 2013-11-01 ENCOUNTER — Other Ambulatory Visit (HOSPITAL_COMMUNITY): Payer: Self-pay | Admitting: Family Medicine

## 2013-11-01 DIAGNOSIS — R911 Solitary pulmonary nodule: Secondary | ICD-10-CM

## 2013-11-03 ENCOUNTER — Ambulatory Visit (HOSPITAL_COMMUNITY)
Admission: RE | Admit: 2013-11-03 | Discharge: 2013-11-03 | Disposition: A | Discharge status: Home / Self Care | Payer: Medicare Other | Source: Ambulatory Visit | Attending: Family Medicine | Admitting: Family Medicine

## 2013-11-03 ENCOUNTER — Encounter (HOSPITAL_COMMUNITY): Payer: Self-pay

## 2013-11-03 DIAGNOSIS — J438 Other emphysema: Secondary | ICD-10-CM | POA: Insufficient documentation

## 2013-11-03 DIAGNOSIS — R911 Solitary pulmonary nodule: Secondary | ICD-10-CM | POA: Insufficient documentation

## 2013-11-03 DIAGNOSIS — K7689 Other specified diseases of liver: Secondary | ICD-10-CM | POA: Insufficient documentation

## 2013-11-03 DIAGNOSIS — J984 Other disorders of lung: Secondary | ICD-10-CM | POA: Insufficient documentation

## 2013-11-03 MED ORDER — IOHEXOL 300 MG/ML  SOLN
80.0000 mL | Freq: Once | INTRAMUSCULAR | Status: AC | PRN
  Administered 2013-11-03: 80 mL via INTRAVENOUS

## 2013-11-07 ENCOUNTER — Ambulatory Visit (HOSPITAL_COMMUNITY)
Admission: RE | Admit: 2013-11-07 | Discharge: 2013-11-07 | Disposition: A | Discharge status: Home / Self Care | Payer: Medicare Other | Source: Ambulatory Visit | Attending: Family Medicine | Admitting: Family Medicine

## 2013-11-07 DIAGNOSIS — Z0000 Encounter for general adult medical examination without abnormal findings: Secondary | ICD-10-CM

## 2013-11-23 ENCOUNTER — Encounter (HOSPITAL_COMMUNITY)
Admission: RE | Admit: 2013-11-23 | Discharge: 2013-11-23 | Disposition: A | Discharge status: Home / Self Care | Payer: Medicare Other | Source: Ambulatory Visit | Attending: Family Medicine | Admitting: Family Medicine

## 2013-11-23 DIAGNOSIS — M810 Age-related osteoporosis without current pathological fracture: Secondary | ICD-10-CM | POA: Insufficient documentation

## 2013-11-23 MED ORDER — ZOLEDRONIC ACID 5 MG/100ML IV SOLN
5.0000 mg | Freq: Once | INTRAVENOUS | Status: AC
  Administered 2013-11-23: 5 mg via INTRAVENOUS

## 2013-11-23 MED ORDER — ZOLEDRONIC ACID 5 MG/100ML IV SOLN
INTRAVENOUS | Status: AC
  Filled 2013-11-23: qty 100

## 2013-11-23 MED ORDER — SODIUM CHLORIDE 0.9 % IV SOLN
INTRAVENOUS | Status: DC
  Administered 2013-11-23: 10:00:00 via INTRAVENOUS

## 2013-11-23 NOTE — Discharge Instructions (Signed)
Osteoporosis °Throughout your life, your body breaks down old bone and replaces it with new bone. As you get older, your body does not replace bone as quickly as it breaks it down. By the age of 30 years, most people begin to gradually lose bone because of the imbalance between bone loss and replacement. Some people lose more bone than others. Bone loss beyond a specified normal degree is considered osteoporosis.  °Osteoporosis affects the strength and durability of your bones. The inside of the ends of your bones and your flat bones, like the bones of your pelvis, look like honeycomb, filled with tiny open spaces. As bone loss occurs, your bones become less dense. This means that the open spaces inside your bones become bigger and the walls between these spaces become thinner. This makes your bones weaker. Bones of a person with osteoporosis can become so weak that they can break (fracture) during minor accidents, such as a simple fall. °CAUSES  °The following factors have been associated with the development of osteoporosis: °· Smoking. °· Drinking more than 2 alcoholic drinks several days per week. °· Long-term use of certain medicines: °¨ Corticosteroids. °¨ Chemotherapy medicines. °¨ Thyroid medicines. °¨ Antiepileptic medicines. °¨ Gonadal hormone suppression medicine. °¨ Immunosuppression medicine. °· Being underweight. °· Lack of physical activity. °· Lack of exposure to the sun. This can lead to vitamin D deficiency. °· Certain medical conditions: °¨ Certain inflammatory bowel diseases, such as Crohn disease and ulcerative colitis. °¨ Diabetes. °¨ Hyperthyroidism. °¨ Hyperparathyroidism. °RISK FACTORS °Anyone can develop osteoporosis. However, the following factors can increase your risk of developing osteoporosis: °· Gender--Women are at higher risk than men. °· Age--Being older than 50 years increases your risk. °· Ethnicity--White and Asian people have an increased risk. °· Weight --Being extremely  underweight can increase your risk of osteoporosis. °· Family history of osteoporosis--Having a family member who has developed osteoporosis can increase your risk. °SYMPTOMS  °Usually, people with osteoporosis have no symptoms.  °DIAGNOSIS  °Signs during a physical exam that may prompt your caregiver to suspect osteoporosis include: °· Decreased height. This is usually caused by the compression of the bones that form your spine (vertebrae) because they have weakened and become fractured. °· A curving or rounding of the upper back (kyphosis). °To confirm signs of osteoporosis, your caregiver may request a procedure that uses 2 low-dose X-ray beams with different levels of energy to measure your bone mineral density (dual-energy X-ray absorptiometry [DXA]). Also, your caregiver may check your level of vitamin D. °TREATMENT  °The goal of osteoporosis treatment is to strengthen bones in order to decrease the risk of bone fractures. There are different types of medicines available to help achieve this goal. Some of these medicines work by slowing the processes of bone loss. Some medicines work by increasing bone density. Treatment also involves making sure that your levels of calcium and vitamin D are adequate. °PREVENTION  °There are things you can do to help prevent osteoporosis. Adequate intake of calcium and vitamin D can help you achieve optimal bone mineral density. Regular exercise can also help, especially resistance and weight-bearing activities. If you smoke, quitting smoking is an important part of osteoporosis prevention. °MAKE SURE YOU: °· Understand these instructions. °· Will watch your condition. °· Will get help right away if you are not doing well or get worse. °FOR MORE INFORMATION °www.osteo.org and www.nof.org °Document Released: 02/19/2005 Document Revised: 09/06/2012 Document Reviewed: 04/26/2011 °ExitCare® Patient Information ©2015 [employer]. This information is not   intended to replace advice  given to you by your health care provider. Make sure you discuss any questions you have with your health care provider. °Zoledronic Acid injection (Paget's Disease, Osteoporosis) °What is this medicine? °ZOLEDRONIC ACID (ZOE le dron ik AS id) lowers the amount of calcium loss from bone. It is used to treat Paget's disease and osteoporosis in women. °This medicine may be used for other purposes; ask your health care provider or pharmacist if you have questions. °COMMON BRAND NAME(S): Reclast, Zometa °What should I tell my health care provider before I take this medicine? °They need to know if you have any of these conditions: °-aspirin-sensitive asthma °-cancer, especially if you are receiving medicines used to treat cancer °-dental disease or wear dentures °-infection °-kidney disease °-low levels of calcium in the blood °-past surgery on the parathyroid gland or intestines °-receiving corticosteroids like dexamethasone or prednisone °-an unusual or allergic reaction to zoledronic acid, other medicines, foods, dyes, or preservatives °-pregnant or trying to get pregnant °-breast-feeding °How should I use this medicine? °This medicine is for infusion into a vein. It is given by a health care professional in a hospital or clinic setting. °Talk to your pediatrician regarding the use of this medicine in children. This medicine is not approved for use in children. °Overdosage: If you think you have taken too much of this medicine contact a poison control center or emergency room at once. °NOTE: This medicine is only for you. Do not share this medicine with others. °What if I miss a dose? °It is important not to miss your dose. Call your doctor or health care professional if you are unable to keep an appointment. °What may interact with this medicine? °-certain antibiotics given by injection °-NSAIDs, medicines for pain and inflammation, like ibuprofen or naproxen °-some diuretics like bumetanide,  furosemide °-teriparatide °This list may not describe all possible interactions. Give your health care provider a list of all the medicines, herbs, non-prescription drugs, or dietary supplements you use. Also tell them if you smoke, drink alcohol, or use illegal drugs. Some items may interact with your medicine. °What should I watch for while using this medicine? °Visit your doctor or health care professional for regular checkups. It may be some time before you see the benefit from this medicine. Do not stop taking your medicine unless your doctor tells you to. Your doctor may order blood tests or other tests to see how you are doing. °Women should inform their doctor if they wish to become pregnant or think they might be pregnant. There is a potential for serious side effects to an unborn child. Talk to your health care professional or pharmacist for more information. °You should make sure that you get enough calcium and vitamin D while you are taking this medicine. Discuss the foods you eat and the vitamins you take with your health care professional. °Some people who take this medicine have severe bone, joint, and/or muscle pain. This medicine may also increase your risk for jaw problems or a broken thigh bone. Tell your doctor right away if you have severe pain in your jaw, bones, joints, or muscles. Tell your doctor if you have any pain that does not go away or that gets worse. °Tell your dentist and dental surgeon that you are taking this medicine. You should not have major dental surgery while on this medicine. See your dentist to have a dental exam and fix any dental problems before starting this medicine. Take good care of   your teeth while on this medicine. Make sure you see your dentist for regular follow-up appointments. °What side effects may I notice from receiving this medicine? °Side effects that you should report to your doctor or health care professional as soon as possible: °-allergic reactions  like skin rash, itching or hives, swelling of the face, lips, or tongue °-anxiety, confusion, or depression °-breathing problems °-changes in vision °-eye pain °-feeling faint or lightheaded, falls °-jaw pain, especially after dental work °-mouth sores °-muscle cramps, stiffness, or weakness °-trouble passing urine or change in the amount of urine °Side effects that usually do not require medical attention (report to your doctor or health care professional if they continue or are bothersome): °-bone, joint, or muscle pain °-constipation °-diarrhea °-fever °-hair loss °-irritation at site where injected °-loss of appetite °-nausea, vomiting °-stomach upset °-trouble sleeping °-trouble swallowing °-weak or tired °This list may not describe all possible side effects. Call your doctor for medical advice about side effects. You may report side effects to FDA at 1-800-FDA-1088. °Where should I keep my medicine? °This drug is given in a hospital or clinic and will not be stored at home. °NOTE: This sheet is a summary. It may not cover all possible information. If you have questions about this medicine, talk to your doctor, pharmacist, or health care provider. °© 2015, [employer]/Gold [employer]. (2012-10-25 10:03:48) ° °

## 2013-12-22 ENCOUNTER — Encounter (HOSPITAL_COMMUNITY): Payer: Self-pay | Admitting: Pharmacist

## 2013-12-26 NOTE — Patient Instructions (Signed)
Your procedure is scheduled on:  Monday, 01/02/14  Report to [facility] at    [place]    AM.  Call this number if you have problems the morning of surgery: [phone]   Remember:   Do not eat or drink   After Midnight.  Take these medicines the morning of surgery with A SIP OF WATER: paxil and cozaar   Do not wear jewelry, make-up or nail polish.  Do not wear lotions, powders, or perfumes. You may wear deodorant.  Do not bring valuables to the hospital.  Contacts, dentures or bridgework may not be worn into surgery.     Patients discharged the day of surgery will not be allowed to drive home.  Name and phone number of your driver: driver  Special Instructions: Use eye drops as directed.   Please read over the following fact sheets that you were given: Pain Booklet, Anesthesia Post-op Instructions and Care and Recovery After Surgery    Cataract Surgery  A cataract is a clouding of the lens of the eye. When a lens becomes cloudy, vision is reduced based on the degree and nature of the clouding. Surgery may be needed to improve vision. Surgery removes the cloudy lens and usually replaces it with a substitute lens (intraocular lens, IOL). LET YOUR EYE DOCTOR KNOW ABOUT:  Allergies to food or medicine.   Medicines taken including herbs, eyedrops, over-the-counter medicines, and creams.   Use of steroids (by mouth or creams).   Previous problems with anesthetics or numbing medicine.   History of bleeding problems or blood clots.   Previous surgery.   Other health problems, including diabetes and kidney problems.   Possibility of pregnancy, if this applies.  RISKS AND COMPLICATIONS  Infection.   Inflammation of the eyeball (endophthalmitis) that can spread to both eyes (sympathetic ophthalmia).   Poor wound healing.   If an IOL is inserted, it can later fall out of proper position. This is very uncommon.   Clouding of the part of your eye that holds an IOL in place. This is  called an "after-cataract." These are uncommon, but easily treated.  BEFORE THE PROCEDURE  Do not eat or drink anything except small amounts of water for 8 to 12 before your surgery, or as directed by your caregiver.   Unless you are told otherwise, continue any eyedrops you have been prescribed.   Talk to your primary caregiver about all other medicines that you take (both prescription and non-prescription). In some cases, you may need to stop or change medicines near the time of your surgery. This is most important if you are taking blood-thinning medicine.Do not stop medicines unless you are told to do so.   Arrange for someone to drive you to and from the procedure.   Do not put contact lenses in either eye on the day of your surgery.  PROCEDURE There is more than one method for safely removing a cataract. Your doctor can explain the differences and help determine which is best for you. Phacoemulsification surgery is the most common form of cataract surgery.  An injection is given behind the eye or eyedrops are given to make this a painless procedure.   A small cut (incision) is made on the edge of the clear, dome-shaped surface that covers the front of the eye (cornea).   A tiny probe is painlessly inserted into the eye. This device gives off ultrasound waves that soften and break up the cloudy center of the lens. This  makes it easier for the cloudy lens to be removed by suction.   An IOL may be implanted.   The normal lens of the eye is covered by a clear capsule. Part of that capsule is intentionally left in the eye to support the IOL.   Your surgeon may or may not use stitches to close the incision.  There are other forms of cataract surgery that require a larger incision and stiches to close the eye. This approach is taken in cases where the doctor feels that the cataract cannot be easily removed using phacoemulsification. AFTER THE PROCEDURE  When an IOL is implanted, it  does not need care. It becomes a permanent part of your eye and cannot be seen or felt.   Your doctor will schedule follow-up exams to check on your progress.   Review your other medicines with your doctor to see which can be resumed after surgery.   Use eyedrops or take medicine as prescribed by your doctor.  Document Released: 05/01/2011 Document Reviewed: 04/28/2011 [facility] Patient Information 2012 [place].  PATIENT INSTRUCTIONS POST-ANESTHESIA  IMMEDIATELY FOLLOWING SURGERY:  Do not drive or operate machinery for the first twenty four hours after surgery.  Do not make any important decisions for twenty four hours after surgery or while taking narcotic pain medications or sedatives.  If you develop intractable nausea and vomiting or a severe headache please notify your doctor immediately.  FOLLOW-UP:  Please make an appointment with your surgeon as instructed. You do not need to follow up with anesthesia unless specifically instructed to do so.  WOUND CARE INSTRUCTIONS (if applicable):  Keep a dry clean dressing on the anesthesia/puncture wound site if there is drainage.  Once the wound has quit draining you may leave it open to air.  Generally you should leave the bandage intact for twenty four hours unless there is drainage.  If the epidural site drains for more than 36-48 hours please call the anesthesia department.  QUESTIONS?:  Please feel free to call your physician or the hospital operator if you have any questions, and they will be happy to assist you.

## 2013-12-27 ENCOUNTER — Encounter (HOSPITAL_COMMUNITY)
Admission: RE | Admit: 2013-12-27 | Discharge: 2013-12-27 | Disposition: A | Discharge status: Home / Self Care | Payer: Medicare Other | Source: Ambulatory Visit | Attending: Ophthalmology | Admitting: Ophthalmology

## 2013-12-27 ENCOUNTER — Encounter (HOSPITAL_COMMUNITY): Payer: Self-pay

## 2013-12-27 ENCOUNTER — Other Ambulatory Visit: Payer: Self-pay

## 2013-12-27 DIAGNOSIS — Z01810 Encounter for preprocedural cardiovascular examination: Secondary | ICD-10-CM | POA: Diagnosis not present

## 2013-12-27 DIAGNOSIS — Z01812 Encounter for preprocedural laboratory examination: Secondary | ICD-10-CM | POA: Diagnosis present

## 2013-12-27 LAB — BASIC METABOLIC PANEL
Anion gap: 11 (ref 5–15)
BUN: 14 mg/dL (ref 6–23)
CHLORIDE: 102 meq/L (ref 96–112)
CO2: 27 meq/L (ref 19–32)
Calcium: 10.5 mg/dL (ref 8.4–10.5)
Creatinine, Ser: 0.81 mg/dL (ref 0.50–1.10)
GFR calc Af Amer: 83 mL/min — ABNORMAL LOW (ref >90)
GFR calc non Af Amer: 71 mL/min — ABNORMAL LOW (ref >90)
Glucose, Bld: 79 mg/dL (ref 70–99)
Potassium: 4.3 mEq/L (ref 3.7–5.3)
SODIUM: 140 meq/L (ref 137–147)

## 2013-12-27 LAB — HEMOGLOBIN AND HEMATOCRIT, BLOOD
HEMATOCRIT: 38.6 % (ref 36.0–46.0)
Hemoglobin: 12.8 g/dL (ref 12.0–15.0)

## 2013-12-30 MED ORDER — TETRACAINE HCL 0.5 % OP SOLN
OPHTHALMIC | Status: AC
  Filled 2013-12-30: qty 2

## 2013-12-30 MED ORDER — LIDOCAINE HCL (PF) 1 % IJ SOLN
INTRAMUSCULAR | Status: AC
  Filled 2013-12-30: qty 2

## 2013-12-30 MED ORDER — LIDOCAINE HCL 3.5 % OP GEL
OPHTHALMIC | Status: AC
  Filled 2013-12-30: qty 1

## 2013-12-30 MED ORDER — CYCLOPENTOLATE-PHENYLEPHRINE OP SOLN OPTIME - NO CHARGE
OPHTHALMIC | Status: AC
  Filled 2013-12-30: qty 2

## 2013-12-30 MED ORDER — NEOMYCIN-POLYMYXIN-DEXAMETH 3.5-10000-0.1 OP SUSP
OPHTHALMIC | Status: AC
  Filled 2013-12-30: qty 5

## 2013-12-30 MED ORDER — PHENYLEPHRINE HCL 2.5 % OP SOLN
OPHTHALMIC | Status: AC
  Filled 2013-12-30: qty 15

## 2014-01-02 ENCOUNTER — Ambulatory Visit (HOSPITAL_COMMUNITY): Payer: Medicare Other | Admitting: Anesthesiology

## 2014-01-02 ENCOUNTER — Encounter (HOSPITAL_COMMUNITY): Payer: Medicare Other | Admitting: Anesthesiology

## 2014-01-02 ENCOUNTER — Ambulatory Visit (HOSPITAL_COMMUNITY)
Admission: RE | Admit: 2014-01-02 | Discharge: 2014-01-02 | Disposition: A | Discharge status: Home / Self Care | Payer: Medicare Other | Source: Ambulatory Visit | Attending: Ophthalmology | Admitting: Ophthalmology

## 2014-01-02 ENCOUNTER — Encounter (HOSPITAL_COMMUNITY): Payer: Self-pay | Admitting: *Deleted

## 2014-01-02 ENCOUNTER — Encounter (HOSPITAL_COMMUNITY)
Admission: RE | Disposition: A | Discharge status: Home / Self Care | Payer: Self-pay | Source: Ambulatory Visit | Attending: Ophthalmology

## 2014-01-02 DIAGNOSIS — H2510 Age-related nuclear cataract, unspecified eye: Secondary | ICD-10-CM | POA: Diagnosis present

## 2014-01-02 HISTORY — PX: CATARACT EXTRACTION W/PHACO: SHX586

## 2014-01-02 SURGERY — PHACOEMULSIFICATION, CATARACT, WITH IOL INSERTION
Anesthesia: Monitor Anesthesia Care | Site: Eye | Laterality: Left

## 2014-01-02 MED ORDER — LACTATED RINGERS IV SOLN
INTRAVENOUS | Status: DC
  Administered 2014-01-02: 07:00:00 via INTRAVENOUS

## 2014-01-02 MED ORDER — CYCLOPENTOLATE-PHENYLEPHRINE 0.2-1 % OP SOLN
1.0000 [drp] | OPHTHALMIC | Status: AC
  Administered 2014-01-02 (×3): 1 [drp] via OPHTHALMIC

## 2014-01-02 MED ORDER — MIDAZOLAM HCL 2 MG/2ML IJ SOLN
1.0000 mg | INTRAMUSCULAR | Status: DC | PRN
  Administered 2014-01-02: 2 mg via INTRAVENOUS

## 2014-01-02 MED ORDER — PHENYLEPHRINE HCL 2.5 % OP SOLN
1.0000 [drp] | OPHTHALMIC | Status: AC
Start: 2014-01-02 — End: 2014-01-02
  Administered 2014-01-02 (×3): 1 [drp] via OPHTHALMIC

## 2014-01-02 MED ORDER — FENTANYL CITRATE 0.05 MG/ML IJ SOLN
25.0000 ug | INTRAMUSCULAR | Status: AC
  Administered 2014-01-02 (×2): 25 ug via INTRAVENOUS

## 2014-01-02 MED ORDER — NEOMYCIN-POLYMYXIN-DEXAMETH 3.5-10000-0.1 OP SUSP
OPHTHALMIC | Status: DC | PRN
  Administered 2014-01-02: 2 [drp] via OPHTHALMIC

## 2014-01-02 MED ORDER — EPINEPHRINE HCL 1 MG/ML IJ SOLN
INTRAMUSCULAR | Status: AC
  Filled 2014-01-02: qty 1

## 2014-01-02 MED ORDER — MIDAZOLAM HCL 2 MG/2ML IJ SOLN
INTRAMUSCULAR | Status: AC
  Filled 2014-01-02: qty 2

## 2014-01-02 MED ORDER — TETRACAINE HCL 0.5 % OP SOLN
1.0000 [drp] | OPHTHALMIC | Status: AC
  Administered 2014-01-02 (×3): 1 [drp] via OPHTHALMIC

## 2014-01-02 MED ORDER — PROVISC 10 MG/ML IO SOLN
INTRAOCULAR | Status: DC | PRN
  Administered 2014-01-02: 0.85 mL via INTRAOCULAR

## 2014-01-02 MED ORDER — BSS IO SOLN
INTRAOCULAR | Status: DC | PRN
  Administered 2014-01-02: 15 mL

## 2014-01-02 MED ORDER — LIDOCAINE HCL (PF) 1 % IJ SOLN
INTRAMUSCULAR | Status: DC | PRN
  Administered 2014-01-02: .6 mL

## 2014-01-02 MED ORDER — POVIDONE-IODINE 5 % OP SOLN
OPHTHALMIC | Status: DC | PRN
  Administered 2014-01-02: 1 via OPHTHALMIC

## 2014-01-02 MED ORDER — FENTANYL CITRATE 0.05 MG/ML IJ SOLN
INTRAMUSCULAR | Status: AC
  Filled 2014-01-02: qty 2

## 2014-01-02 MED ORDER — LIDOCAINE HCL 3.5 % OP GEL
1.0000 "application " | Freq: Once | OPHTHALMIC | Status: AC
  Administered 2014-01-02: 1 via OPHTHALMIC

## 2014-01-02 MED ORDER — EPINEPHRINE HCL 1 MG/ML IJ SOLN
INTRAOCULAR | Status: DC | PRN
  Administered 2014-01-02: 08:00:00

## 2014-01-02 SURGICAL SUPPLY — 33 items
CAPSULAR TENSION RING-AMO (OPHTHALMIC RELATED) IMPLANT
CLOTH BEACON ORANGE TIMEOUT ST (SAFETY) ×1 IMPLANT
EYE SHIELD UNIVERSAL CLEAR (GAUZE/BANDAGES/DRESSINGS) ×1 IMPLANT
GLOVE BIO SURGEON STRL SZ 6.5 (GLOVE) IMPLANT
GLOVE BIOGEL PI IND STRL 6.5 (GLOVE) IMPLANT
GLOVE BIOGEL PI IND STRL 7.0 (GLOVE) IMPLANT
GLOVE BIOGEL PI IND STRL 7.5 (GLOVE) IMPLANT
GLOVE BIOGEL PI INDICATOR 6.5 (GLOVE)
GLOVE BIOGEL PI INDICATOR 7.0 (GLOVE) ×1
GLOVE BIOGEL PI INDICATOR 7.5 (GLOVE)
GLOVE ECLIPSE 6.5 STRL STRAW (GLOVE) IMPLANT
GLOVE ECLIPSE 7.0 STRL STRAW (GLOVE) IMPLANT
GLOVE ECLIPSE 7.5 STRL STRAW (GLOVE) IMPLANT
GLOVE EXAM NITRILE LRG STRL (GLOVE) IMPLANT
GLOVE EXAM NITRILE MD LF STRL (GLOVE) ×1 IMPLANT
GLOVE SKINSENSE NS SZ6.5 (GLOVE)
GLOVE SKINSENSE NS SZ7.0 (GLOVE)
GLOVE SKINSENSE STRL SZ6.5 (GLOVE) IMPLANT
GLOVE SKINSENSE STRL SZ7.0 (GLOVE) IMPLANT
KIT VITRECTOMY (OPHTHALMIC RELATED) IMPLANT
PAD ARMBOARD 7.5X6 YLW CONV (MISCELLANEOUS) ×1 IMPLANT
PROC W NO LENS (INTRAOCULAR LENS)
PROC W SPEC LENS (INTRAOCULAR LENS)
PROCESS W NO LENS (INTRAOCULAR LENS) IMPLANT
PROCESS W SPEC LENS (INTRAOCULAR LENS) IMPLANT
RETRACTOR IRIS SIGHTPATH (OPHTHALMIC RELATED) ×1 IMPLANT
RING MALYGIN (MISCELLANEOUS) IMPLANT
SIGHTPATH CAT PROC W REG LENS (Ophthalmic Related) ×2 IMPLANT
SYRINGE LUER LOK 1CC (MISCELLANEOUS) ×1 IMPLANT
TAPE SURG TRANSPORE 1 IN (GAUZE/BANDAGES/DRESSINGS) IMPLANT
TAPE SURGICAL TRANSPORE 1 IN (GAUZE/BANDAGES/DRESSINGS) ×1
VISCOELASTIC ADDITIONAL (OPHTHALMIC RELATED) IMPLANT
WATER STERILE IRR 250ML POUR (IV SOLUTION) ×1 IMPLANT

## 2014-01-02 NOTE — Anesthesia Postprocedure Evaluation (Signed)
  Anesthesia Post-op Note  Patient: Sophia Coleman  Procedure(s) Performed: Procedure(s) (LRB): CATARACT EXTRACTION PHACO AND INTRAOCULAR LENS PLACEMENT (IOC) (Left)  Patient Location:  Short Stay  Anesthesia Type: MAC  Level of Consciousness: awake  Airway and Oxygen Therapy: Patient Spontanous Breathing  Post-op Pain: none  Post-op Assessment: Post-op Vital signs reviewed, Patient's Cardiovascular Status Stable, Respiratory Function Stable, Patent Airway, No signs of Nausea or vomiting and Pain level controlled  Post-op Vital Signs: Reviewed and stable  Complications: No apparent anesthesia complications

## 2014-01-02 NOTE — H&P (Signed)
I have reviewed the H&P, the patient was re-examined, and I have identified no interval changes in medical condition and plan of care since the history and physical of record  

## 2014-01-02 NOTE — Anesthesia Preprocedure Evaluation (Signed)
Anesthesia Evaluation  Patient identified by MRN, date of birth, ID band Patient awake    Reviewed: Allergy & Precautions, H&P , NPO status , Patient's Chart, lab work & pertinent test results  Airway Mallampati: I TM Distance: >3 FB Neck ROM: Full    Dental  (+) Teeth Intact, Implants   Pulmonary COPDCurrent Smoker,  breath sounds clear to auscultation        Cardiovascular hypertension, Pt. on medications Rhythm:Regular Rate:Normal     Neuro/Psych PSYCHIATRIC DISORDERS Depression    GI/Hepatic   Endo/Other    Renal/GU Renal disease     Musculoskeletal   Abdominal   Peds  Hematology   Anesthesia Other Findings   Reproductive/Obstetrics                           Anesthesia Physical Anesthesia Plan  ASA: III  Anesthesia Plan: MAC   Post-op Pain Management:    Induction: Intravenous  Airway Management Planned: Nasal Cannula  Additional Equipment:   Intra-op Plan:   Post-operative Plan:   Informed Consent: I have reviewed the patients History and Physical, chart, labs and discussed the procedure including the risks, benefits and alternatives for the proposed anesthesia with the patient or authorized representative who has indicated his/her understanding and acceptance.     Plan Discussed with:   Anesthesia Plan Comments:         Anesthesia Quick Evaluation

## 2014-01-02 NOTE — Transfer of Care (Signed)
Immediate Anesthesia Transfer of Care Note  Patient: Sophia Coleman  Procedure(s) Performed: Procedure(s) (LRB): CATARACT EXTRACTION PHACO AND INTRAOCULAR LENS PLACEMENT (IOC) (Left)  Patient Location: Shortstay  Anesthesia Type: MAC  Level of Consciousness: awake  Airway & Oxygen Therapy: Patient Spontanous Breathing   Post-op Assessment: Report given to PACU RN, Post -op Vital signs reviewed and stable and Patient moving all extremities  Post vital signs: Reviewed and stable  Complications: No apparent anesthesia complications

## 2014-01-02 NOTE — Op Note (Signed)
Date of Admission: 01/02/2014  Date of Surgery: 01/02/2014   Pre-Op Dx: Cataract Left Eye  Post-Op Dx: Senile Nuclear  Cataract Left  Eye,  Dx Code 366.16  Surgeon: Tonny Branch, M.D.  Assistants: None  Anesthesia: Topical with MAC  Indications: Painless, progressive loss of vision with compromise of daily activities.  Surgery: Cataract Extraction with Intraocular lens Implant Left Eye  Discription: The patient had dilating drops and viscous lidocaine placed into the Left eye in the pre-op holding area. After transfer to the operating room, a time out was performed. The patient was then prepped and draped. Beginning with a 55 degree blade a paracentesis port was made at the surgeon's 2 o'clock position. The anterior chamber was then filled with 1% non-preserved lidocaine. This was followed by filling the anterior chamber with Provisc.  A 2.20mm keratome blade was used to make a clear corneal incision at the temporal limbus.  A bent cystatome needle was used to create a continuous tear capsulotomy. Hydrodissection was performed with balanced salt solution on a Fine canula. The lens nucleus was then removed using the phacoemulsification handpiece. Residual cortex was removed with the I&A handpiece. The anterior chamber and capsular bag were refilled with Provisc. A posterior chamber intraocular lens was placed into the capsular bag with it's injector. The implant was positioned with the Kuglan hook. The Provisc was then removed from the anterior chamber and capsular bag with the I&A handpiece. Stromal hydration of the main incision and paracentesis port was performed with BSS on a Fine canula. The wounds were tested for leak which was negative. The patient tolerated the procedure well. There were no operative complications. The patient was then transferred to the recovery room in stable condition.  Complications: None  Specimen: None  EBL: None  Prosthetic device: Hoya iSert 250, power 21.0 D, SN  [id number].

## 2014-01-02 NOTE — Anesthesia Procedure Notes (Signed)
Procedure Name: MAC Date/Time: 01/02/2014 7:24 AM Performed by: Vista Deck Pre-anesthesia Checklist: Patient identified, Emergency Drugs available, Suction available, Timeout performed and Patient being monitored Patient Re-evaluated:Patient Re-evaluated prior to inductionOxygen Delivery Method: Nasal Cannula

## 2014-01-02 NOTE — Discharge Instructions (Signed)

## 2014-01-04 ENCOUNTER — Encounter (HOSPITAL_COMMUNITY): Payer: Self-pay | Admitting: Ophthalmology

## 2014-02-16 ENCOUNTER — Encounter (HOSPITAL_COMMUNITY): Payer: Self-pay | Admitting: Pharmacy Technician

## 2014-02-28 ENCOUNTER — Encounter (HOSPITAL_COMMUNITY): Payer: Self-pay

## 2014-02-28 ENCOUNTER — Encounter (HOSPITAL_COMMUNITY)
Admission: RE | Admit: 2014-02-28 | Discharge: 2014-02-28 | Disposition: A | Discharge status: Home / Self Care | Payer: Medicare Other | Source: Ambulatory Visit | Attending: Ophthalmology | Admitting: Ophthalmology

## 2014-03-03 MED ORDER — CYCLOPENTOLATE-PHENYLEPHRINE OP SOLN OPTIME - NO CHARGE
OPHTHALMIC | Status: AC
  Filled 2014-03-03: qty 2

## 2014-03-03 MED ORDER — FENTANYL CITRATE 0.05 MG/ML IJ SOLN: 25.0000 ug | INTRAMUSCULAR | Status: DC | PRN

## 2014-03-03 MED ORDER — LIDOCAINE HCL 3.5 % OP GEL
OPHTHALMIC | Status: AC
  Filled 2014-03-03: qty 1

## 2014-03-03 MED ORDER — NEOMYCIN-POLYMYXIN-DEXAMETH 3.5-10000-0.1 OP SUSP
OPHTHALMIC | Status: AC
  Filled 2014-03-03: qty 5

## 2014-03-03 MED ORDER — ONDANSETRON HCL 4 MG/2ML IJ SOLN: 4.0000 mg | Freq: Once | INTRAMUSCULAR | Status: AC | PRN

## 2014-03-03 MED ORDER — LIDOCAINE HCL (PF) 1 % IJ SOLN
INTRAMUSCULAR | Status: AC
  Filled 2014-03-03: qty 2

## 2014-03-03 MED ORDER — PHENYLEPHRINE HCL 2.5 % OP SOLN
OPHTHALMIC | Status: AC
  Filled 2014-03-03: qty 15

## 2014-03-03 MED ORDER — TETRACAINE HCL 0.5 % OP SOLN
OPHTHALMIC | Status: AC
Start: 2014-03-03 — End: 2014-03-03
  Filled 2014-03-03: qty 2

## 2014-03-06 ENCOUNTER — Encounter (HOSPITAL_COMMUNITY)
Admission: RE | Disposition: A | Discharge status: Home / Self Care | Payer: Self-pay | Source: Ambulatory Visit | Attending: Ophthalmology

## 2014-03-06 ENCOUNTER — Encounter (HOSPITAL_COMMUNITY): Payer: Medicare Other | Admitting: Anesthesiology

## 2014-03-06 ENCOUNTER — Encounter (HOSPITAL_COMMUNITY): Payer: Self-pay | Admitting: *Deleted

## 2014-03-06 ENCOUNTER — Ambulatory Visit (HOSPITAL_COMMUNITY): Payer: Medicare Other | Admitting: Anesthesiology

## 2014-03-06 ENCOUNTER — Ambulatory Visit (HOSPITAL_COMMUNITY)
Admission: RE | Admit: 2014-03-06 | Discharge: 2014-03-06 | Disposition: A | Discharge status: Home / Self Care | Payer: Medicare Other | Source: Ambulatory Visit | Attending: Ophthalmology | Admitting: Ophthalmology

## 2014-03-06 DIAGNOSIS — J449 Chronic obstructive pulmonary disease, unspecified: Secondary | ICD-10-CM | POA: Insufficient documentation

## 2014-03-06 DIAGNOSIS — Z7982 Long term (current) use of aspirin: Secondary | ICD-10-CM | POA: Diagnosis not present

## 2014-03-06 DIAGNOSIS — F17200 Nicotine dependence, unspecified, uncomplicated: Secondary | ICD-10-CM | POA: Insufficient documentation

## 2014-03-06 DIAGNOSIS — H2511 Age-related nuclear cataract, right eye: Secondary | ICD-10-CM | POA: Diagnosis present

## 2014-03-06 DIAGNOSIS — F329 Major depressive disorder, single episode, unspecified: Secondary | ICD-10-CM | POA: Diagnosis not present

## 2014-03-06 DIAGNOSIS — I10 Essential (primary) hypertension: Secondary | ICD-10-CM | POA: Insufficient documentation

## 2014-03-06 DIAGNOSIS — Z79899 Other long term (current) drug therapy: Secondary | ICD-10-CM | POA: Diagnosis not present

## 2014-03-06 HISTORY — PX: CATARACT EXTRACTION W/PHACO: SHX586

## 2014-03-06 SURGERY — PHACOEMULSIFICATION, CATARACT, WITH IOL INSERTION
Anesthesia: Monitor Anesthesia Care | Site: Eye | Laterality: Right

## 2014-03-06 MED ORDER — CYCLOPENTOLATE-PHENYLEPHRINE 0.2-1 % OP SOLN
1.0000 [drp] | OPHTHALMIC | Status: AC
  Administered 2014-03-06 (×3): 1 [drp] via OPHTHALMIC

## 2014-03-06 MED ORDER — PHENYLEPHRINE HCL 2.5 % OP SOLN
1.0000 [drp] | OPHTHALMIC | Status: AC
  Administered 2014-03-06 (×3): 1 [drp] via OPHTHALMIC

## 2014-03-06 MED ORDER — NEOMYCIN-POLYMYXIN-DEXAMETH 3.5-10000-0.1 OP SUSP
OPHTHALMIC | Status: DC | PRN
  Administered 2014-03-06: 2 [drp] via OPHTHALMIC

## 2014-03-06 MED ORDER — LACTATED RINGERS IV SOLN
INTRAVENOUS | Status: DC | PRN
  Administered 2014-03-06: 08:00:00 via INTRAVENOUS

## 2014-03-06 MED ORDER — LACTATED RINGERS IV SOLN
INTRAVENOUS | Status: DC
  Administered 2014-03-06: 09:00:00 via INTRAVENOUS

## 2014-03-06 MED ORDER — EPINEPHRINE HCL 1 MG/ML IJ SOLN
INTRAMUSCULAR | Status: AC
  Filled 2014-03-06: qty 1

## 2014-03-06 MED ORDER — LIDOCAINE HCL (PF) 1 % IJ SOLN
INTRAMUSCULAR | Status: DC | PRN
  Administered 2014-03-06: .5 mL

## 2014-03-06 MED ORDER — PROVISC 10 MG/ML IO SOLN
INTRAOCULAR | Status: DC | PRN
  Administered 2014-03-06: 0.85 mL via INTRAOCULAR

## 2014-03-06 MED ORDER — BSS IO SOLN
INTRAOCULAR | Status: DC | PRN
  Administered 2014-03-06: 15 mL

## 2014-03-06 MED ORDER — TETRACAINE HCL 0.5 % OP SOLN
1.0000 [drp] | OPHTHALMIC | Status: AC
  Administered 2014-03-06 (×3): 1 [drp] via OPHTHALMIC

## 2014-03-06 MED ORDER — POVIDONE-IODINE 5 % OP SOLN
OPHTHALMIC | Status: DC | PRN
  Administered 2014-03-06: 1 via OPHTHALMIC

## 2014-03-06 MED ORDER — FENTANYL CITRATE 0.05 MG/ML IJ SOLN
INTRAMUSCULAR | Status: AC
  Filled 2014-03-06: qty 2

## 2014-03-06 MED ORDER — MIDAZOLAM HCL 2 MG/2ML IJ SOLN
1.0000 mg | INTRAMUSCULAR | Status: DC | PRN
  Administered 2014-03-06: 2 mg via INTRAVENOUS

## 2014-03-06 MED ORDER — MIDAZOLAM HCL 2 MG/2ML IJ SOLN
INTRAMUSCULAR | Status: AC
  Filled 2014-03-06: qty 2

## 2014-03-06 MED ORDER — LIDOCAINE HCL 3.5 % OP GEL
1.0000 | Freq: Once | OPHTHALMIC | Status: AC
Start: 2014-03-06 — End: 2014-03-06
  Administered 2014-03-06: 1 via OPHTHALMIC

## 2014-03-06 MED ORDER — FENTANYL CITRATE 0.05 MG/ML IJ SOLN
25.0000 ug | INTRAMUSCULAR | Status: AC
  Administered 2014-03-06 (×2): 25 ug via INTRAVENOUS

## 2014-03-06 MED ORDER — EPINEPHRINE HCL 1 MG/ML IJ SOLN
INTRAOCULAR | Status: DC | PRN
  Administered 2014-03-06: 09:00:00

## 2014-03-06 SURGICAL SUPPLY — 35 items
CAPSULAR TENSION RING-AMO (OPHTHALMIC RELATED) IMPLANT
CLOTH BEACON ORANGE TIMEOUT ST (SAFETY) ×1 IMPLANT
EYE SHIELD UNIVERSAL CLEAR (GAUZE/BANDAGES/DRESSINGS) ×1 IMPLANT
GLOVE BIO SURGEON STRL SZ 6.5 (GLOVE) IMPLANT
GLOVE BIOGEL PI IND STRL 6.5 (GLOVE) IMPLANT
GLOVE BIOGEL PI IND STRL 7.0 (GLOVE) IMPLANT
GLOVE BIOGEL PI IND STRL 7.5 (GLOVE) IMPLANT
GLOVE BIOGEL PI INDICATOR 6.5 (GLOVE)
GLOVE BIOGEL PI INDICATOR 7.0 (GLOVE) ×1
GLOVE BIOGEL PI INDICATOR 7.5 (GLOVE)
GLOVE ECLIPSE 6.5 STRL STRAW (GLOVE) IMPLANT
GLOVE ECLIPSE 7.0 STRL STRAW (GLOVE) IMPLANT
GLOVE ECLIPSE 7.5 STRL STRAW (GLOVE) IMPLANT
GLOVE EXAM NITRILE LRG STRL (GLOVE) IMPLANT
GLOVE EXAM NITRILE MD LF STRL (GLOVE) ×1 IMPLANT
GLOVE SKINSENSE NS SZ6.5 (GLOVE)
GLOVE SKINSENSE NS SZ7.0 (GLOVE)
GLOVE SKINSENSE STRL SZ6.5 (GLOVE) IMPLANT
GLOVE SKINSENSE STRL SZ7.0 (GLOVE) IMPLANT
GOWN STRL REUS W/ TWL LRG LVL4 (GOWN DISPOSABLE) IMPLANT
GOWN STRL REUS W/TWL LRG LVL4 (GOWN DISPOSABLE) ×2
KIT VITRECTOMY (OPHTHALMIC RELATED) IMPLANT
PAD ARMBOARD 7.5X6 YLW CONV (MISCELLANEOUS) ×1 IMPLANT
PROC W NO LENS (INTRAOCULAR LENS)
PROC W SPEC LENS (INTRAOCULAR LENS)
PROCESS W NO LENS (INTRAOCULAR LENS) IMPLANT
PROCESS W SPEC LENS (INTRAOCULAR LENS) IMPLANT
RETRACTOR IRIS SIGHTPATH (OPHTHALMIC RELATED) IMPLANT
RING MALYGIN (MISCELLANEOUS) IMPLANT
SIGHTPATH CAT PROC W REG LENS (Ophthalmic Related) ×2 IMPLANT
SYRINGE LUER LOK 1CC (MISCELLANEOUS) ×1 IMPLANT
TAPE SURG TRANSPORE 1 IN (GAUZE/BANDAGES/DRESSINGS) IMPLANT
TAPE SURGICAL TRANSPORE 1 IN (GAUZE/BANDAGES/DRESSINGS) ×1
VISCOELASTIC ADDITIONAL (OPHTHALMIC RELATED) IMPLANT
WATER STERILE IRR 250ML POUR (IV SOLUTION) ×1 IMPLANT

## 2014-03-06 NOTE — Anesthesia Procedure Notes (Signed)
Procedure Name: MAC Date/Time: 03/06/2014 9:21 AM Performed by: Andree Elk, AMY A Pre-anesthesia Checklist: Patient identified, Timeout performed, Emergency Drugs available, Suction available and Patient being monitored Oxygen Delivery Method: Nasal cannula

## 2014-03-06 NOTE — Anesthesia Preprocedure Evaluation (Signed)
Anesthesia Evaluation  Patient identified by MRN, date of birth, ID band Patient awake    Reviewed: Allergy & Precautions, H&P , NPO status , Patient's Chart, lab work & pertinent test results  Airway Mallampati: I TM Distance: >3 FB Neck ROM: Full    Dental  (+) Teeth Intact, Implants   Pulmonary COPDCurrent Smoker,  breath sounds clear to auscultation        Cardiovascular hypertension, Pt. on medications Rhythm:Regular Rate:Normal     Neuro/Psych PSYCHIATRIC DISORDERS Depression    GI/Hepatic   Endo/Other    Renal/GU Renal disease     Musculoskeletal   Abdominal   Peds  Hematology   Anesthesia Other Findings   Reproductive/Obstetrics                           Anesthesia Physical Anesthesia Plan  ASA: III  Anesthesia Plan: MAC   Post-op Pain Management:    Induction: Intravenous  Airway Management Planned: Nasal Cannula  Additional Equipment:   Intra-op Plan:   Post-operative Plan:   Informed Consent: I have reviewed the patients History and Physical, chart, labs and discussed the procedure including the risks, benefits and alternatives for the proposed anesthesia with the patient or authorized representative who has indicated his/her understanding and acceptance.     Plan Discussed with:   Anesthesia Plan Comments:         Anesthesia Quick Evaluation

## 2014-03-06 NOTE — Discharge Instructions (Signed)

## 2014-03-06 NOTE — H&P (Signed)
I have reviewed the H&P, the patient was re-examined, and I have identified no interval changes in medical condition and plan of care since the history and physical of record  

## 2014-03-06 NOTE — Op Note (Signed)
Date of Admission: 03/06/2014  Date of Surgery: 03/06/2014   Pre-Op Dx: Cataract Right Eye  Post-Op Dx: Senile Nuclear Cataract Right  Eye,  Dx Code H25.11  Surgeon: Tonny Branch, M.D.  Assistants: None  Anesthesia: Topical with MAC  Indications: Painless, progressive loss of vision with compromise of daily activities.  Surgery: Cataract Extraction with Intraocular lens Implant Right Eye  Discription: The patient had dilating drops and viscous lidocaine placed into the Right eye in the pre-op holding area. After transfer to the operating room, a time out was performed. The patient was then prepped and draped. Beginning with a 52 degree blade a paracentesis port was made at the surgeon's 2 o'clock position. The anterior chamber was then filled with 1% non-preserved lidocaine. This was followed by filling the anterior chamber with Provisc.  A 2.63mm keratome blade was used to make a clear corneal incision at the temporal limbus.  A bent cystatome needle was used to create a continuous tear capsulotomy. Hydrodissection was performed with balanced salt solution on a Fine canula. The lens nucleus was then removed using the phacoemulsification handpiece. Residual cortex was removed with the I&A handpiece. The anterior chamber and capsular bag were refilled with Provisc. A posterior chamber intraocular lens was placed into the capsular bag with it's injector. The implant was positioned with the Kuglan hook. The Provisc was then removed from the anterior chamber and capsular bag with the I&A handpiece. Stromal hydration of the main incision and paracentesis port was performed with BSS on a Fine canula. The wounds were tested for leak which was negative. The patient tolerated the procedure well. There were no operative complications. The patient was then transferred to the recovery room in stable condition.  Complications: None  Specimen: None  EBL: None  Prosthetic device: Hoya iSert 250, power 20.5  D, SN [id number].

## 2014-03-06 NOTE — Transfer of Care (Signed)
Immediate Anesthesia Transfer of Care Note  Patient: Sophia Coleman  Procedure(s) Performed: Procedure(s) with comments: CATARACT EXTRACTION PHACO AND INTRAOCULAR LENS PLACEMENT (IOC) (Right) - CDE:15.14  Patient Location: Short Stay  Anesthesia Type:MAC  Level of Consciousness: awake, alert , oriented and patient cooperative  Airway & Oxygen Therapy: Patient Spontanous Breathing  Post-op Assessment: Report given to PACU RN and Post -op Vital signs reviewed and stable  Post vital signs: Reviewed and stable  Complications: No apparent anesthesia complications

## 2014-03-06 NOTE — Anesthesia Postprocedure Evaluation (Signed)
  Anesthesia Post-op Note  Patient: Sophia Coleman  Procedure(s) Performed: Procedure(s) with comments: CATARACT EXTRACTION PHACO AND INTRAOCULAR LENS PLACEMENT (IOC) (Right) - CDE:15.14  Patient Location: Short Stay  Anesthesia Type:MAC  Level of Consciousness: awake, alert , oriented and patient cooperative  Airway and Oxygen Therapy: Patient Spontanous Breathing  Post-op Pain: none  Post-op Assessment: Post-op Vital signs reviewed, Patient's Cardiovascular Status Stable, Respiratory Function Stable, Patent Airway and Pain level controlled  Post-op Vital Signs: Reviewed and stable  Last Vitals:  Filed Vitals:   03/06/14 0915  BP: 121/61  Pulse:   Temp:   Resp: 16    Complications: No apparent anesthesia complications

## 2014-03-07 ENCOUNTER — Encounter (HOSPITAL_COMMUNITY): Payer: Self-pay | Admitting: Ophthalmology

## 2014-04-04 ENCOUNTER — Other Ambulatory Visit (HOSPITAL_COMMUNITY): Payer: Self-pay | Admitting: Internal Medicine

## 2014-04-04 DIAGNOSIS — R229 Localized swelling, mass and lump, unspecified: Principal | ICD-10-CM

## 2014-04-04 DIAGNOSIS — IMO0002 Reserved for concepts with insufficient information to code with codable children: Secondary | ICD-10-CM

## 2014-04-25 ENCOUNTER — Ambulatory Visit (HOSPITAL_COMMUNITY)
Admission: RE | Admit: 2014-04-25 | Discharge: 2014-04-25 | Disposition: A | Discharge status: Home / Self Care | Payer: Medicare Other | Source: Ambulatory Visit | Attending: Internal Medicine | Admitting: Internal Medicine

## 2014-04-25 DIAGNOSIS — R229 Localized swelling, mass and lump, unspecified: Secondary | ICD-10-CM

## 2014-04-25 DIAGNOSIS — R921 Mammographic calcification found on diagnostic imaging of breast: Secondary | ICD-10-CM | POA: Insufficient documentation

## 2014-04-25 DIAGNOSIS — IMO0002 Reserved for concepts with insufficient information to code with codable children: Secondary | ICD-10-CM

## 2014-11-24 ENCOUNTER — Inpatient Hospital Stay (HOSPITAL_COMMUNITY): Admission: RE | Admit: 2014-11-24 | Payer: Medicare Other | Source: Ambulatory Visit

## 2014-12-01 ENCOUNTER — Other Ambulatory Visit (HOSPITAL_COMMUNITY): Payer: Self-pay | Admitting: Family Medicine

## 2014-12-01 DIAGNOSIS — R911 Solitary pulmonary nodule: Secondary | ICD-10-CM

## 2014-12-05 ENCOUNTER — Ambulatory Visit (HOSPITAL_COMMUNITY)
Admission: RE | Admit: 2014-12-05 | Discharge: 2014-12-05 | Disposition: A | Discharge status: Home / Self Care | Payer: Medicare Other | Source: Ambulatory Visit | Attending: Family Medicine | Admitting: Family Medicine

## 2014-12-05 DIAGNOSIS — J439 Emphysema, unspecified: Secondary | ICD-10-CM | POA: Diagnosis not present

## 2014-12-05 DIAGNOSIS — R911 Solitary pulmonary nodule: Secondary | ICD-10-CM | POA: Diagnosis not present

## 2014-12-05 MED ORDER — IOHEXOL 300 MG/ML  SOLN
75.0000 mL | Freq: Once | INTRAMUSCULAR | Status: AC | PRN
  Administered 2014-12-05: 75 mL via INTRAVENOUS

## 2015-01-26 ENCOUNTER — Other Ambulatory Visit (HOSPITAL_COMMUNITY): Payer: Self-pay | Admitting: Family Medicine

## 2015-01-26 DIAGNOSIS — M545 Low back pain: Secondary | ICD-10-CM

## 2015-01-26 DIAGNOSIS — M5410 Radiculopathy, site unspecified: Secondary | ICD-10-CM

## 2015-02-02 ENCOUNTER — Ambulatory Visit (HOSPITAL_COMMUNITY)
Admission: RE | Admit: 2015-02-02 | Discharge: 2015-02-02 | Disposition: A | Discharge status: Home / Self Care | Payer: Medicare Other | Source: Ambulatory Visit | Attending: Family Medicine | Admitting: Family Medicine

## 2015-02-02 DIAGNOSIS — M5136 Other intervertebral disc degeneration, lumbar region: Secondary | ICD-10-CM | POA: Diagnosis not present

## 2015-02-02 DIAGNOSIS — M5410 Radiculopathy, site unspecified: Secondary | ICD-10-CM

## 2015-02-02 DIAGNOSIS — M79605 Pain in left leg: Secondary | ICD-10-CM | POA: Diagnosis not present

## 2015-02-02 DIAGNOSIS — M545 Low back pain: Secondary | ICD-10-CM | POA: Diagnosis not present

## 2015-02-02 DIAGNOSIS — M4806 Spinal stenosis, lumbar region: Secondary | ICD-10-CM | POA: Diagnosis not present

## 2015-02-02 DIAGNOSIS — M4807 Spinal stenosis, lumbosacral region: Secondary | ICD-10-CM | POA: Diagnosis not present

## 2015-02-26 ENCOUNTER — Other Ambulatory Visit (HOSPITAL_COMMUNITY): Payer: Self-pay | Admitting: Family Medicine

## 2015-02-26 DIAGNOSIS — Z1231 Encounter for screening mammogram for malignant neoplasm of breast: Secondary | ICD-10-CM

## 2015-04-30 ENCOUNTER — Ambulatory Visit (HOSPITAL_COMMUNITY)
Admission: RE | Admit: 2015-04-30 | Discharge: 2015-04-30 | Disposition: A | Discharge status: Home / Self Care | Payer: Medicare Other | Source: Ambulatory Visit | Attending: Family Medicine | Admitting: Family Medicine

## 2015-04-30 DIAGNOSIS — Z1231 Encounter for screening mammogram for malignant neoplasm of breast: Secondary | ICD-10-CM | POA: Insufficient documentation

## 2015-07-05 DIAGNOSIS — Z1389 Encounter for screening for other disorder: Secondary | ICD-10-CM | POA: Diagnosis not present

## 2015-07-05 DIAGNOSIS — F419 Anxiety disorder, unspecified: Secondary | ICD-10-CM | POA: Diagnosis not present

## 2015-07-05 DIAGNOSIS — J984 Other disorders of lung: Secondary | ICD-10-CM | POA: Diagnosis not present

## 2015-07-05 DIAGNOSIS — Z681 Body mass index (BMI) 19 or less, adult: Secondary | ICD-10-CM | POA: Diagnosis not present

## 2015-08-02 ENCOUNTER — Other Ambulatory Visit (HOSPITAL_COMMUNITY): Payer: Self-pay | Admitting: Family Medicine

## 2015-08-02 ENCOUNTER — Ambulatory Visit (HOSPITAL_COMMUNITY): Payer: Medicare Other

## 2015-08-02 DIAGNOSIS — J984 Other disorders of lung: Secondary | ICD-10-CM

## 2015-08-09 ENCOUNTER — Other Ambulatory Visit (HOSPITAL_COMMUNITY): Payer: Medicare Other

## 2015-08-13 ENCOUNTER — Other Ambulatory Visit (HOSPITAL_COMMUNITY): Payer: Self-pay | Admitting: Family Medicine

## 2015-08-13 DIAGNOSIS — J984 Other disorders of lung: Secondary | ICD-10-CM

## 2015-08-15 ENCOUNTER — Ambulatory Visit (HOSPITAL_COMMUNITY)
Admission: RE | Admit: 2015-08-15 | Discharge: 2015-08-15 | Disposition: A | Discharge status: Home / Self Care | Payer: Medicare Other | Source: Ambulatory Visit | Attending: Family Medicine | Admitting: Family Medicine

## 2015-08-15 DIAGNOSIS — R918 Other nonspecific abnormal finding of lung field: Secondary | ICD-10-CM | POA: Insufficient documentation

## 2015-08-15 DIAGNOSIS — M4806 Spinal stenosis, lumbar region: Secondary | ICD-10-CM | POA: Insufficient documentation

## 2015-08-15 DIAGNOSIS — J984 Other disorders of lung: Secondary | ICD-10-CM | POA: Diagnosis not present

## 2015-08-15 DIAGNOSIS — M5126 Other intervertebral disc displacement, lumbar region: Secondary | ICD-10-CM | POA: Diagnosis not present

## 2015-08-15 DIAGNOSIS — K760 Fatty (change of) liver, not elsewhere classified: Secondary | ICD-10-CM | POA: Insufficient documentation

## 2015-08-15 DIAGNOSIS — I700 Atherosclerosis of aorta: Secondary | ICD-10-CM | POA: Insufficient documentation

## 2015-08-15 DIAGNOSIS — I2510 Atherosclerotic heart disease of native coronary artery without angina pectoris: Secondary | ICD-10-CM | POA: Insufficient documentation

## 2015-08-15 MED ORDER — IOHEXOL 300 MG/ML  SOLN
75.0000 mL | Freq: Once | INTRAMUSCULAR | Status: AC | PRN
  Administered 2015-08-15: 75 mL via INTRAVENOUS

## 2015-08-28 DIAGNOSIS — J209 Acute bronchitis, unspecified: Secondary | ICD-10-CM | POA: Diagnosis not present

## 2015-08-28 DIAGNOSIS — Z1389 Encounter for screening for other disorder: Secondary | ICD-10-CM | POA: Diagnosis not present

## 2015-08-28 DIAGNOSIS — Z681 Body mass index (BMI) 19 or less, adult: Secondary | ICD-10-CM | POA: Diagnosis not present

## 2015-10-11 DIAGNOSIS — I10 Essential (primary) hypertension: Secondary | ICD-10-CM | POA: Diagnosis not present

## 2015-10-11 DIAGNOSIS — J449 Chronic obstructive pulmonary disease, unspecified: Secondary | ICD-10-CM | POA: Diagnosis not present

## 2015-10-11 DIAGNOSIS — R911 Solitary pulmonary nodule: Secondary | ICD-10-CM | POA: Diagnosis not present

## 2015-11-19 DIAGNOSIS — I10 Essential (primary) hypertension: Secondary | ICD-10-CM | POA: Diagnosis not present

## 2015-11-19 DIAGNOSIS — W64XXXA Exposure to other animate mechanical forces, initial encounter: Secondary | ICD-10-CM | POA: Diagnosis not present

## 2015-11-19 DIAGNOSIS — Z681 Body mass index (BMI) 19 or less, adult: Secondary | ICD-10-CM | POA: Diagnosis not present

## 2015-11-19 DIAGNOSIS — W57XXXA Bitten or stung by nonvenomous insect and other nonvenomous arthropods, initial encounter: Secondary | ICD-10-CM | POA: Diagnosis not present

## 2015-11-19 DIAGNOSIS — D692 Other nonthrombocytopenic purpura: Secondary | ICD-10-CM | POA: Diagnosis not present

## 2015-11-19 DIAGNOSIS — Z1389 Encounter for screening for other disorder: Secondary | ICD-10-CM | POA: Diagnosis not present

## 2015-11-19 DIAGNOSIS — F329 Major depressive disorder, single episode, unspecified: Secondary | ICD-10-CM | POA: Diagnosis not present

## 2015-11-19 DIAGNOSIS — J449 Chronic obstructive pulmonary disease, unspecified: Secondary | ICD-10-CM | POA: Diagnosis not present

## 2015-12-26 ENCOUNTER — Other Ambulatory Visit: Payer: Self-pay | Admitting: Dermatology

## 2015-12-26 DIAGNOSIS — C44629 Squamous cell carcinoma of skin of left upper limb, including shoulder: Secondary | ICD-10-CM | POA: Diagnosis not present

## 2015-12-26 DIAGNOSIS — L720 Epidermal cyst: Secondary | ICD-10-CM | POA: Diagnosis not present

## 2015-12-26 DIAGNOSIS — D239 Other benign neoplasm of skin, unspecified: Secondary | ICD-10-CM | POA: Diagnosis not present

## 2015-12-26 DIAGNOSIS — L821 Other seborrheic keratosis: Secondary | ICD-10-CM | POA: Diagnosis not present

## 2015-12-26 DIAGNOSIS — D487 Neoplasm of uncertain behavior of other specified sites: Secondary | ICD-10-CM | POA: Diagnosis not present

## 2016-01-23 ENCOUNTER — Other Ambulatory Visit (HOSPITAL_COMMUNITY): Payer: Self-pay | Admitting: Pulmonary Disease

## 2016-01-23 DIAGNOSIS — R911 Solitary pulmonary nodule: Secondary | ICD-10-CM

## 2016-01-30 ENCOUNTER — Ambulatory Visit (HOSPITAL_COMMUNITY)
Admission: RE | Admit: 2016-01-30 | Discharge: 2016-01-30 | Disposition: A | Discharge status: Home / Self Care | Payer: Medicare Other | Source: Ambulatory Visit | Attending: Pulmonary Disease | Admitting: Pulmonary Disease

## 2016-01-30 DIAGNOSIS — I700 Atherosclerosis of aorta: Secondary | ICD-10-CM | POA: Diagnosis not present

## 2016-01-30 DIAGNOSIS — R911 Solitary pulmonary nodule: Secondary | ICD-10-CM | POA: Diagnosis not present

## 2016-01-30 DIAGNOSIS — M47894 Other spondylosis, thoracic region: Secondary | ICD-10-CM | POA: Diagnosis not present

## 2016-01-30 DIAGNOSIS — J439 Emphysema, unspecified: Secondary | ICD-10-CM | POA: Diagnosis not present

## 2016-01-30 DIAGNOSIS — J984 Other disorders of lung: Secondary | ICD-10-CM | POA: Diagnosis not present

## 2016-01-30 DIAGNOSIS — R918 Other nonspecific abnormal finding of lung field: Secondary | ICD-10-CM | POA: Insufficient documentation

## 2016-02-07 DIAGNOSIS — C44629 Squamous cell carcinoma of skin of left upper limb, including shoulder: Secondary | ICD-10-CM | POA: Diagnosis not present

## 2016-02-08 DIAGNOSIS — I10 Essential (primary) hypertension: Secondary | ICD-10-CM | POA: Diagnosis not present

## 2016-02-08 DIAGNOSIS — R0989 Other specified symptoms and signs involving the circulatory and respiratory systems: Secondary | ICD-10-CM | POA: Diagnosis not present

## 2016-02-08 DIAGNOSIS — Z681 Body mass index (BMI) 19 or less, adult: Secondary | ICD-10-CM | POA: Diagnosis not present

## 2016-02-08 DIAGNOSIS — Z1389 Encounter for screening for other disorder: Secondary | ICD-10-CM | POA: Diagnosis not present

## 2016-02-08 DIAGNOSIS — Z0001 Encounter for general adult medical examination with abnormal findings: Secondary | ICD-10-CM | POA: Diagnosis not present

## 2016-02-08 DIAGNOSIS — F17290 Nicotine dependence, other tobacco product, uncomplicated: Secondary | ICD-10-CM | POA: Diagnosis not present

## 2016-02-13 ENCOUNTER — Other Ambulatory Visit (HOSPITAL_COMMUNITY): Payer: Self-pay | Admitting: Family Medicine

## 2016-02-13 DIAGNOSIS — R0989 Other specified symptoms and signs involving the circulatory and respiratory systems: Secondary | ICD-10-CM

## 2016-02-19 ENCOUNTER — Ambulatory Visit (HOSPITAL_COMMUNITY)
Admission: RE | Admit: 2016-02-19 | Discharge: 2016-02-19 | Disposition: A | Discharge status: Home / Self Care | Payer: Medicare Other | Source: Ambulatory Visit | Attending: Family Medicine | Admitting: Family Medicine

## 2016-02-19 DIAGNOSIS — I6523 Occlusion and stenosis of bilateral carotid arteries: Secondary | ICD-10-CM | POA: Insufficient documentation

## 2016-02-19 DIAGNOSIS — R0989 Other specified symptoms and signs involving the circulatory and respiratory systems: Secondary | ICD-10-CM | POA: Diagnosis not present

## 2016-02-22 ENCOUNTER — Other Ambulatory Visit: Payer: Self-pay | Admitting: Vascular Surgery

## 2016-02-22 DIAGNOSIS — I6522 Occlusion and stenosis of left carotid artery: Secondary | ICD-10-CM

## 2016-03-06 ENCOUNTER — Encounter: Payer: Self-pay | Admitting: Vascular Surgery

## 2016-03-10 ENCOUNTER — Ambulatory Visit (HOSPITAL_COMMUNITY)
Admission: RE | Admit: 2016-03-10 | Discharge: 2016-03-10 | Disposition: A | Discharge status: Home / Self Care | Payer: Medicare Other | Source: Ambulatory Visit | Attending: Surgery | Admitting: Surgery

## 2016-03-10 DIAGNOSIS — I6522 Occlusion and stenosis of left carotid artery: Secondary | ICD-10-CM | POA: Insufficient documentation

## 2016-03-10 LAB — VAS US CAROTID
LCCAPDIAS: 17 cm/s
LEFT ECA DIAS: -13 cm/s
LEFT VERTEBRAL DIAS: 20 cm/s
LICADDIAS: -31 cm/s
LICADSYS: -99 cm/s
Left CCA dist dias: -13 cm/s
Left CCA dist sys: -56 cm/s
Left CCA prox sys: 84 cm/s

## 2016-03-12 ENCOUNTER — Ambulatory Visit (INDEPENDENT_AMBULATORY_CARE_PROVIDER_SITE_OTHER): Payer: Medicare Other | Admitting: Vascular Surgery

## 2016-03-12 ENCOUNTER — Encounter: Payer: Self-pay | Admitting: Vascular Surgery

## 2016-03-12 VITALS — BP 149/67 | HR 60 | Temp 96.8°F | Resp 18 | Ht 59.5 in | Wt 95.7 lb

## 2016-03-12 DIAGNOSIS — I6522 Occlusion and stenosis of left carotid artery: Secondary | ICD-10-CM | POA: Diagnosis not present

## 2016-03-12 NOTE — Progress Notes (Signed)
Vascular and Vein Specialist of [place]  Patient name: Sophia Coleman MRN: [id number] DOB: 04-19-1941 Sex: female  REASON FOR CONSULT: Evaluation of left carotid stenosis  HPI: Sophia Coleman is a 75 y.o. female, who is here today for evaluation of left carotid stenosis. She was found to have bruit on physical exam and underwent a duplex at outlying hospital. This suggested severe left internal carotid artery stenosis with no significant right carotid stenosis. She is seen today for further discussion. She specifically denies any prior history of amaurosis fugax, transient ischemic attack or stroke. She does have a history of hypertension. She unfortunately continues to smoke cigarettes.  Past Medical History:  Diagnosis Date  . Arthritis   . Chronic kidney disease   . COPD (chronic obstructive pulmonary disease) ([place])   . Depression   . Hypertension     History reviewed. No pertinent family history.  SOCIAL HISTORY: Social History   Social History  . Marital status: Widowed    Spouse name: N/A  . Number of children: N/A  . Years of education: N/A   Occupational History  . Not on file.   Social History Main Topics  . Smoking status: Current Every Day Smoker    Packs/day: 1.00    Years: 30.00  . Smokeless tobacco: Never Used  . Alcohol use No  . Drug use: No  . Sexual activity: Not on file   Other Topics Concern  . Not on file   Social History Narrative  . No narrative on file    Allergies  Allergen Reactions  . Augmentin [Amoxicillin-Pot Clavulanate] Other (See Comments)    Reaction: severe sweats  . Quinolones Nausea And Vomiting    Current Outpatient Prescriptions  Medication Sig Dispense Refill  . aspirin 81 MG tablet Take 81 mg by mouth daily.    Marland Kitchen losartan (COZAAR) 100 MG tablet Take 100 mg by mouth every morning.    Marland Kitchen PARoxetine (PAXIL) 20 MG tablet Take 20 mg by mouth every morning.    . Black Cohosh 40 MG  CAPS Take 1 capsule by mouth every morning.    . Calcium Carbonate-Vitamin D (CALTRATE 600+D) 600-400 MG-UNIT per tablet Take 1 tablet by mouth 2 (two) times daily.     Marland Kitchen ibuprofen (ADVIL,MOTRIN) 200 MG tablet Take 200 mg by mouth every 4 (four) hours as needed. For pain    . ketorolac (ACULAR) 0.4 % SOLN     . ofloxacin (OCUFLOX) 0.3 % ophthalmic solution     . Omega-3 Fatty Acids (FISH OIL) 1200 MG CAPS Take 1 capsule by mouth every morning.    . prednisoLONE acetate (PRED FORTE) 1 % ophthalmic suspension     . zoledronic acid (RECLAST) 5 MG/100ML SOLN injection Inject 5 mg into the vein as directed. Once a year injection at [facility]     No current facility-administered medications for this visit.     REVIEW OF SYSTEMS:  [X]  denotes positive finding, [ ]  denotes negative finding Cardiac  Comments:  Chest pain or chest pressure:    Shortness of breath upon exertion:    Short of breath when lying flat:    Irregular heart rhythm:        Vascular    Pain in calf, thigh, or hip brought on by ambulation: x   Pain in feet at night that wakes you up from your sleep:     Blood clot in your veins:    Leg swelling:  Pulmonary    Oxygen at home:    Productive cough:     Wheezing:         Neurologic    Sudden weakness in arms or legs:     Sudden numbness in arms or legs:     Sudden onset of difficulty speaking or slurred speech:    Temporary loss of vision in one eye:     Problems with dizziness:         Gastrointestinal    Blood in stool:     Vomited blood:         Genitourinary    Burning when urinating:     Blood in urine:        Psychiatric    Major depression:         Hematologic    Bleeding problems:    Problems with blood clotting too easily:        Skin    Rashes or ulcers:        Constitutional    Fever or chills:      PHYSICAL EXAM: Vitals:   03/12/16 1042 03/12/16 1045 03/12/16 1046  BP: (!) 142/62 (!) 154/69 (!) 149/67  Pulse: 60    Resp: 18     Temp: (!) 96.8 F (36 C)    TempSrc: Oral    SpO2: 100%    Weight: 95 lb 11.2 oz (43.4 kg)    Height: 4' 11.5" (1.511 m)      GENERAL: The patient is a well-nourished female, in no acute distress. The vital signs are documented above. CARDIOVASCULAR: Arch left carotid bruit and no bruit on the right. Heart is regular rate and rhythm. 2+ radial and 2+ dorsalis pedis pulses bilaterally PULMONARY: There is good air exchange  ABDOMEN: Soft and non-tender  MUSCULOSKELETAL: There are no major deformities or cyanosis. NEUROLOGIC: No focal weakness or paresthesias are detected. SKIN: There are no ulcers or rashes noted. PSYCHIATRIC: The patient has a normal affect.  DATA:  I reviewed her duplex from outlying hospital. We also repeated her left carotid duplex in our office to determine if she was a candidate for surgery based on duplex alone. This did confirm critical stenosis in her left internal carotid artery. The internal carotid artery above the bifurcation became normal.  MEDICAL ISSUES: Severe asymptomatic left internal carotid artery stenosis. The patient is right handed. I have recommended endarterectomy for reduction of stroke risk. Expanded procedure is a typical one day hospitalization. Also explain the 1-1-1/2% risk of stroke with surgery. She understands and wished to proceed as soon as possible.   Rosetta Posner, MD FACS Vascular and Vein Specialists of [facility] Tel [phone] Pager [phone]

## 2016-03-12 NOTE — Progress Notes (Signed)
Vitals:   03/12/16 1042 03/12/16 1045 03/12/16 1046  BP: (!) 142/62 (!) 154/69 (!) 149/67  Pulse: 60    Resp: 18    Temp: (!) 96.8 F (36 C)    TempSrc: Oral    SpO2: 100%    Weight: 95 lb 11.2 oz (43.4 kg)    Height: 4' 11.5" (1.511 m)

## 2016-03-20 ENCOUNTER — Other Ambulatory Visit: Payer: Self-pay

## 2016-03-20 DIAGNOSIS — Z23 Encounter for immunization: Secondary | ICD-10-CM | POA: Diagnosis not present

## 2016-03-24 ENCOUNTER — Other Ambulatory Visit (HOSPITAL_COMMUNITY): Payer: Self-pay | Admitting: Internal Medicine

## 2016-03-24 DIAGNOSIS — Z1231 Encounter for screening mammogram for malignant neoplasm of breast: Secondary | ICD-10-CM

## 2016-03-31 NOTE — Pre-Procedure Instructions (Signed)
Sophia Coleman  03/31/2016      [facility] - [place], [place] - [address] [place] [address] Phone: [phone] Fax: [phone]    Your procedure is scheduled on Wed, Nov 8 @ 8:30 AM  Report to [facility] Admitting at 6:30 AM  Call this number if you have problems the morning of surgery:  [phone]   Remember:  Do not eat food or drink liquids after midnight.  Take these medicines the morning of surgery with A SIP OF WATER Paxil(Paroxetine)              Stop taking your Ibuprofen and Aspirin. No Goody's,BC's,Aleve,Advil,Motrin,Fish Oil,or any Herbal Medications.    Do not wear jewelry, make-up or nail polish.  Do not wear lotions, powders, perfumes, or deoderant.  Do not shave 48 hours prior to surgery.    Do not bring valuables to the hospital.  [facility] is not responsible for any belongings or valuables.  Contacts, dentures or bridgework may not be worn into surgery.  Leave your suitcase in the car.  After surgery it may be brought to your room.  For patients admitted to the hospital, discharge time will be determined by your treatment team.  Patients discharged the day of surgery will not be allowed to drive home.    [facility]  Before surgery, you can play an important role.  Because skin is not sterile, your skin needs to be as free of germs as possible.  You can reduce the number of germs on you skin by washing with CHG (chlorahexidine gluconate) soap before surgery.  CHG is an antiseptic cleaner which kills germs and bonds with the skin to continue killing germs even after washing.  Please DO NOT use if you have an allergy to CHG or antibacterial soaps.  If your skin becomes reddened/irritated stop using the CHG and inform your nurse when you arrive at Short Stay.  Do not shave (including legs and underarms) for at least 48 hours prior to the first CHG shower.  You may  shave your face.  Please follow these instructions carefully:   1.  Shower with CHG Soap the night before surgery and the                                morning of Surgery.  2.  If you choose to wash your hair, wash your hair first as usual with your       normal shampoo.  3.  After you shampoo, rinse your hair and body thoroughly to remove the                      Shampoo.  4.  Use CHG as you would any other liquid soap.  You can apply chg directly       to the skin and wash gently with scrungie or a clean washcloth.  5.  Apply the CHG Soap to your body ONLY FROM THE NECK DOWN.        Do not use on open wounds or open sores.  Avoid contact with your eyes,       ears, mouth and genitals (private parts).  Wash genitals (private parts)       with your normal soap.  6.  Wash thoroughly, paying special attention to the area where your  surgery        will be performed.  7.  Thoroughly rinse your body with warm water from the neck down.  8.  DO NOT shower/wash with your normal soap after using and rinsing off       the CHG Soap.  9.  Pat yourself dry with a clean towel.            10.  Wear clean pajamas.            11.  Place clean sheets on your bed the night of your first shower and do not        sleep with pets.  Day of Surgery  Do not apply any lotions/deoderants the morning of surgery.  Please wear clean clothes to the hospital/surgery center.    Please read over the following fact sheets that you were given. Pain Booklet, Coughing and Deep Breathing, MRSA Information and Surgical Site Infection Prevention

## 2016-04-01 ENCOUNTER — Encounter (HOSPITAL_COMMUNITY)
Admission: RE | Admit: 2016-04-01 | Discharge: 2016-04-01 | Disposition: A | Discharge status: Home / Self Care | Payer: Medicare Other | Source: Ambulatory Visit | Attending: Vascular Surgery | Admitting: Vascular Surgery

## 2016-04-01 ENCOUNTER — Encounter (HOSPITAL_COMMUNITY): Payer: Self-pay

## 2016-04-01 DIAGNOSIS — I6522 Occlusion and stenosis of left carotid artery: Secondary | ICD-10-CM | POA: Diagnosis not present

## 2016-04-01 DIAGNOSIS — N189 Chronic kidney disease, unspecified: Secondary | ICD-10-CM | POA: Diagnosis not present

## 2016-04-01 DIAGNOSIS — Z888 Allergy status to other drugs, medicaments and biological substances status: Secondary | ICD-10-CM | POA: Diagnosis not present

## 2016-04-01 DIAGNOSIS — F17210 Nicotine dependence, cigarettes, uncomplicated: Secondary | ICD-10-CM | POA: Diagnosis not present

## 2016-04-01 DIAGNOSIS — J449 Chronic obstructive pulmonary disease, unspecified: Secondary | ICD-10-CM | POA: Diagnosis not present

## 2016-04-01 DIAGNOSIS — M1990 Unspecified osteoarthritis, unspecified site: Secondary | ICD-10-CM | POA: Diagnosis not present

## 2016-04-01 DIAGNOSIS — Z79899 Other long term (current) drug therapy: Secondary | ICD-10-CM | POA: Diagnosis not present

## 2016-04-01 DIAGNOSIS — Z7982 Long term (current) use of aspirin: Secondary | ICD-10-CM | POA: Diagnosis not present

## 2016-04-01 DIAGNOSIS — I129 Hypertensive chronic kidney disease with stage 1 through stage 4 chronic kidney disease, or unspecified chronic kidney disease: Secondary | ICD-10-CM | POA: Diagnosis not present

## 2016-04-01 HISTORY — DX: Pneumonia, unspecified organism: J18.9

## 2016-04-01 HISTORY — DX: Anxiety disorder, unspecified: F41.9

## 2016-04-01 HISTORY — DX: Cardiac murmur, unspecified: R01.1

## 2016-04-01 HISTORY — DX: Anemia, unspecified: D64.9

## 2016-04-01 HISTORY — DX: Dyspnea, unspecified: R06.00

## 2016-04-01 HISTORY — DX: Other specified postprocedural states: Z98.890

## 2016-04-01 HISTORY — DX: Nausea with vomiting, unspecified: R11.2

## 2016-04-01 LAB — COMPREHENSIVE METABOLIC PANEL
ALBUMIN: 4 g/dL (ref 3.5–5.0)
ALK PHOS: 67 U/L (ref 38–126)
ALT: 14 U/L (ref 14–54)
ANION GAP: 9 (ref 5–15)
AST: 20 U/L (ref 15–41)
BUN: 21 mg/dL — ABNORMAL HIGH (ref 6–20)
CHLORIDE: 107 mmol/L (ref 101–111)
CO2: 23 mmol/L (ref 22–32)
Calcium: 10.1 mg/dL (ref 8.9–10.3)
Creatinine, Ser: 0.91 mg/dL (ref 0.44–1.00)
GFR calc non Af Amer: >60 mL/min (ref >60)
GLUCOSE: 104 mg/dL — AB (ref 65–99)
Potassium: 4.1 mmol/L (ref 3.5–5.1)
SODIUM: 139 mmol/L (ref 135–145)
Total Bilirubin: 0.6 mg/dL (ref 0.3–1.2)
Total Protein: 7.1 g/dL (ref 6.5–8.1)

## 2016-04-01 LAB — URINALYSIS, ROUTINE W REFLEX MICROSCOPIC
BILIRUBIN URINE: NEGATIVE
GLUCOSE, UA: NEGATIVE mg/dL
KETONES UR: NEGATIVE mg/dL
Leukocytes, UA: NEGATIVE
Nitrite: NEGATIVE
PROTEIN: NEGATIVE mg/dL
Specific Gravity, Urine: 1.019 (ref 1.005–1.030)
pH: 5.5 (ref 5.0–8.0)

## 2016-04-01 LAB — URINE MICROSCOPIC-ADD ON

## 2016-04-01 LAB — CBC
HCT: 35.5 % — ABNORMAL LOW (ref 36.0–46.0)
HEMOGLOBIN: 11.2 g/dL — AB (ref 12.0–15.0)
MCH: 25.7 pg — AB (ref 26.0–34.0)
MCHC: 31.5 g/dL (ref 30.0–36.0)
MCV: 81.6 fL (ref 78.0–100.0)
PLATELETS: 304 10*3/uL (ref 150–400)
RBC: 4.35 MIL/uL (ref 3.87–5.11)
RDW: 16.8 % — ABNORMAL HIGH (ref 11.5–15.5)
WBC: 7.6 10*3/uL (ref 4.0–10.5)

## 2016-04-01 LAB — TYPE AND SCREEN
ABO/RH(D): O POS
ANTIBODY SCREEN: NEGATIVE

## 2016-04-01 LAB — PROTIME-INR
INR: 0.92
Prothrombin Time: 12.3 seconds (ref 11.4–15.2)

## 2016-04-01 LAB — SURGICAL PCR SCREEN
MRSA, PCR: NEGATIVE
Staphylococcus aureus: NEGATIVE

## 2016-04-01 LAB — ABO/RH: ABO/RH(D): O POS

## 2016-04-01 LAB — APTT: APTT: 30 s (ref 24–36)

## 2016-04-01 NOTE — Anesthesia Preprocedure Evaluation (Addendum)
Anesthesia Evaluation  Patient identified by MRN, date of birth, ID band Patient awake    Reviewed: Allergy & Precautions, NPO status , Patient's Chart, lab work & pertinent test results  History of Anesthesia Complications (+) PONV and history of anesthetic complications  Airway Mallampati: II  TM Distance: >3 FB Neck ROM: Full    Dental no notable dental hx. (+) Dental Advisory Given, Edentulous Upper, Edentulous Lower   Pulmonary COPD, Current Smoker,    Pulmonary exam normal        Cardiovascular hypertension, Normal cardiovascular exam     Neuro/Psych PSYCHIATRIC DISORDERS Anxiety Depression negative neurological ROS     GI/Hepatic negative GI ROS, Neg liver ROS,   Endo/Other    Renal/GU      Musculoskeletal   Abdominal   Peds  Hematology   Anesthesia Other Findings   Reproductive/Obstetrics                            Anesthesia Physical Anesthesia Plan  ASA: III  Anesthesia Plan: General   Post-op Pain Management:    Induction: Intravenous  Airway Management Planned: Oral ETT  Additional Equipment: Arterial line  Intra-op Plan:   Post-operative Plan: Extubation in OR  Informed Consent: I have reviewed the patients History and Physical, chart, labs and discussed the procedure including the risks, benefits and alternatives for the proposed anesthesia with the patient or authorized representative who has indicated his/her understanding and acceptance.   Dental advisory given  Plan Discussed with: CRNA, Anesthesiologist and Surgeon  Anesthesia Plan Comments:        Anesthesia Quick Evaluation

## 2016-04-01 NOTE — Anesthesia Preprocedure Evaluation (Signed)
Anesthesia Evaluation  Patient identified by MRN, date of birth, ID band Patient awake    Reviewed: Allergy & Precautions, NPO status , Patient's Chart, lab work & pertinent test results  History of Anesthesia Complications (+) PONV and history of anesthetic complications  Airway        Dental   Pulmonary COPD, Current Smoker,           Cardiovascular hypertension, Pt. on medications + Peripheral Vascular Disease       Neuro/Psych PSYCHIATRIC DISORDERS Anxiety Depression negative neurological ROS     GI/Hepatic negative GI ROS, Neg liver ROS,   Endo/Other  negative endocrine ROS  Renal/GU Renal InsufficiencyRenal disease     Musculoskeletal  (+) Arthritis , Osteoarthritis,    Abdominal   Peds  Hematology  (+) Blood dyscrasia, anemia ,   Anesthesia Other Findings Day of surgery medications reviewed with the patient.  Reproductive/Obstetrics                             Anesthesia Physical Anesthesia Plan  ASA: III  Anesthesia Plan: General   Post-op Pain Management:    Induction: Intravenous  Airway Management Planned: Oral ETT  Additional Equipment: Arterial line  Intra-op Plan:   Post-operative Plan: Extubation in OR  Informed Consent: I have reviewed the patients History and Physical, chart, labs and discussed the procedure including the risks, benefits and alternatives for the proposed anesthesia with the patient or authorized representative who has indicated his/her understanding and acceptance.   Dental advisory given  Plan Discussed with: CRNA  Anesthesia Plan Comments: (Risks/benefits of general anesthesia discussed with patient including risk of damage to teeth, lips, gum, and tongue, nausea/vomiting, allergic reactions to medications, and the possibility of heart attack, stroke and death.  All patient questions answered.  Patient wishes to proceed.)         Anesthesia Quick Evaluation

## 2016-04-01 NOTE — Progress Notes (Addendum)
PCP is Dr. Gerarda Fraction Denies ever seeing a cardiologist. Denies ever having a stress test, echo, or card cath. States she was told as a teenager she had a heart murmur, but hasn't been told since. Informed not to smoke on the day of surgery.- voices understanding.  Pt left before EKG was done will need day of surgery.

## 2016-04-01 NOTE — Progress Notes (Signed)
Dr Luther Parody office called spoke with Arbie Cookey- regarding Aspirin- states to continue taking and take on the morning of.

## 2016-04-02 ENCOUNTER — Encounter (HOSPITAL_COMMUNITY): Payer: Self-pay | Admitting: Certified Registered Nurse Anesthetist

## 2016-04-02 ENCOUNTER — Encounter (HOSPITAL_COMMUNITY)
Admission: RE | Disposition: A | Discharge status: Home / Self Care | Payer: Self-pay | Source: Ambulatory Visit | Attending: Vascular Surgery

## 2016-04-02 ENCOUNTER — Inpatient Hospital Stay (HOSPITAL_COMMUNITY)
Admission: RE | Admit: 2016-04-02 | Discharge: 2016-04-04 | DRG: 039 | Disposition: A | Discharge status: Home / Self Care | Payer: Medicare Other | Source: Ambulatory Visit | Attending: Vascular Surgery | Admitting: Vascular Surgery

## 2016-04-02 ENCOUNTER — Inpatient Hospital Stay (HOSPITAL_COMMUNITY): Payer: Medicare Other | Admitting: Certified Registered Nurse Anesthetist

## 2016-04-02 DIAGNOSIS — J449 Chronic obstructive pulmonary disease, unspecified: Secondary | ICD-10-CM | POA: Diagnosis not present

## 2016-04-02 DIAGNOSIS — F418 Other specified anxiety disorders: Secondary | ICD-10-CM | POA: Diagnosis not present

## 2016-04-02 DIAGNOSIS — N189 Chronic kidney disease, unspecified: Secondary | ICD-10-CM | POA: Diagnosis not present

## 2016-04-02 DIAGNOSIS — I6522 Occlusion and stenosis of left carotid artery: Secondary | ICD-10-CM | POA: Diagnosis not present

## 2016-04-02 DIAGNOSIS — Z888 Allergy status to other drugs, medicaments and biological substances status: Secondary | ICD-10-CM

## 2016-04-02 DIAGNOSIS — F17210 Nicotine dependence, cigarettes, uncomplicated: Secondary | ICD-10-CM | POA: Diagnosis not present

## 2016-04-02 DIAGNOSIS — Z7982 Long term (current) use of aspirin: Secondary | ICD-10-CM

## 2016-04-02 DIAGNOSIS — I129 Hypertensive chronic kidney disease with stage 1 through stage 4 chronic kidney disease, or unspecified chronic kidney disease: Secondary | ICD-10-CM | POA: Diagnosis not present

## 2016-04-02 DIAGNOSIS — I10 Essential (primary) hypertension: Secondary | ICD-10-CM | POA: Diagnosis not present

## 2016-04-02 DIAGNOSIS — M1990 Unspecified osteoarthritis, unspecified site: Secondary | ICD-10-CM | POA: Diagnosis not present

## 2016-04-02 DIAGNOSIS — Z79899 Other long term (current) drug therapy: Secondary | ICD-10-CM

## 2016-04-02 DIAGNOSIS — I6529 Occlusion and stenosis of unspecified carotid artery: Secondary | ICD-10-CM | POA: Diagnosis present

## 2016-04-02 HISTORY — PX: PATCH ANGIOPLASTY: SHX6230

## 2016-04-02 HISTORY — PX: ENDARTERECTOMY: SHX5162

## 2016-04-02 LAB — CBC
HCT: 30.7 % — ABNORMAL LOW (ref 36.0–46.0)
Hemoglobin: 9.6 g/dL — ABNORMAL LOW (ref 12.0–15.0)
MCH: 25.7 pg — AB (ref 26.0–34.0)
MCHC: 31.3 g/dL (ref 30.0–36.0)
MCV: 82.1 fL (ref 78.0–100.0)
PLATELETS: 261 10*3/uL (ref 150–400)
RBC: 3.74 MIL/uL — AB (ref 3.87–5.11)
RDW: 16.7 % — AB (ref 11.5–15.5)
WBC: 13.1 10*3/uL — ABNORMAL HIGH (ref 4.0–10.5)

## 2016-04-02 LAB — CREATININE, SERUM
Creatinine, Ser: 0.81 mg/dL (ref 0.44–1.00)
GFR calc Af Amer: >60 mL/min (ref >60)
GFR calc non Af Amer: >60 mL/min (ref >60)

## 2016-04-02 SURGERY — ENDARTERECTOMY, CAROTID
Anesthesia: General | Site: Neck | Laterality: Left

## 2016-04-02 MED ORDER — SODIUM CHLORIDE 0.9 % IJ SOLN
INTRAMUSCULAR | Status: AC
  Filled 2016-04-02: qty 10

## 2016-04-02 MED ORDER — DEXAMETHASONE SODIUM PHOSPHATE 10 MG/ML IJ SOLN
INTRAMUSCULAR | Status: DC | PRN
  Administered 2016-04-02: 10 mg via INTRAVENOUS

## 2016-04-02 MED ORDER — FENTANYL CITRATE (PF) 100 MCG/2ML IJ SOLN
INTRAMUSCULAR | Status: AC
  Filled 2016-04-02: qty 4

## 2016-04-02 MED ORDER — 0.9 % SODIUM CHLORIDE (POUR BTL) OPTIME
TOPICAL | Status: DC | PRN
  Administered 2016-04-02: 2000 mL

## 2016-04-02 MED ORDER — HYDRALAZINE HCL 20 MG/ML IJ SOLN: 5.0000 mg | INTRAMUSCULAR | Status: DC | PRN

## 2016-04-02 MED ORDER — ALUM & MAG HYDROXIDE-SIMETH 200-200-20 MG/5ML PO SUSP: 15.0000 mL | ORAL | Status: DC | PRN

## 2016-04-02 MED ORDER — ONDANSETRON HCL 4 MG/2ML IJ SOLN
4.0000 mg | Freq: Four times a day (QID) | INTRAMUSCULAR | Status: DC | PRN
  Administered 2016-04-02 – 2016-04-03 (×2): 4 mg via INTRAVENOUS
  Filled 2016-04-02 (×2): qty 2

## 2016-04-02 MED ORDER — ASPIRIN EC 81 MG PO TBEC
81.0000 mg | DELAYED_RELEASE_TABLET | Freq: Every day | ORAL | Status: DC
Start: 2016-04-02 — End: 2016-04-04
  Administered 2016-04-03 – 2016-04-04 (×2): 81 mg via ORAL
  Filled 2016-04-02 (×2): qty 1

## 2016-04-02 MED ORDER — SODIUM CHLORIDE 0.9 % IV SOLN
0.0125 ug/kg/min | INTRAVENOUS | Status: DC
  Filled 2016-04-02: qty 2000

## 2016-04-02 MED ORDER — SODIUM CHLORIDE 0.9 % IV SOLN: 500.0000 mL | Freq: Once | INTRAVENOUS | Status: DC | PRN

## 2016-04-02 MED ORDER — SODIUM CHLORIDE 0.9 % IV SOLN
INTRAVENOUS | Status: DC | PRN
  Administered 2016-04-02: 08:00:00

## 2016-04-02 MED ORDER — METOPROLOL TARTRATE 5 MG/5ML IV SOLN: 2.0000 mg | INTRAVENOUS | Status: DC | PRN

## 2016-04-02 MED ORDER — ROCURONIUM BROMIDE 10 MG/ML (PF) SYRINGE
PREFILLED_SYRINGE | INTRAVENOUS | Status: AC
  Filled 2016-04-02: qty 10

## 2016-04-02 MED ORDER — PAROXETINE HCL 20 MG PO TABS
20.0000 mg | ORAL_TABLET | Freq: Every day | ORAL | Status: DC
  Administered 2016-04-03 – 2016-04-04 (×2): 20 mg via ORAL
  Filled 2016-04-02 (×3): qty 1

## 2016-04-02 MED ORDER — CHLORHEXIDINE GLUCONATE 4 % EX LIQD: 60.0000 mL | Freq: Once | CUTANEOUS | Status: DC

## 2016-04-02 MED ORDER — HYDROMORPHONE HCL 1 MG/ML IJ SOLN
0.2500 mg | INTRAMUSCULAR | Status: DC | PRN
  Administered 2016-04-02: 0.25 mg via INTRAVENOUS

## 2016-04-02 MED ORDER — PANTOPRAZOLE SODIUM 40 MG PO TBEC
40.0000 mg | DELAYED_RELEASE_TABLET | Freq: Every day | ORAL | Status: DC
  Administered 2016-04-03 – 2016-04-04 (×2): 40 mg via ORAL
  Filled 2016-04-02 (×2): qty 1

## 2016-04-02 MED ORDER — HEPARIN SODIUM (PORCINE) 1000 UNIT/ML IJ SOLN
INTRAMUSCULAR | Status: DC | PRN
  Administered 2016-04-02: 5000 [IU] via INTRAVENOUS

## 2016-04-02 MED ORDER — PROPOFOL 10 MG/ML IV BOLUS
INTRAVENOUS | Status: AC
  Filled 2016-04-02: qty 20

## 2016-04-02 MED ORDER — LIDOCAINE 2% (20 MG/ML) 5 ML SYRINGE
INTRAMUSCULAR | Status: DC | PRN
  Administered 2016-04-02: 60 mg via INTRAVENOUS

## 2016-04-02 MED ORDER — ONDANSETRON HCL 4 MG/2ML IJ SOLN
INTRAMUSCULAR | Status: DC | PRN
  Administered 2016-04-02: 4 mg via INTRAVENOUS

## 2016-04-02 MED ORDER — LACTATED RINGERS IV SOLN
INTRAVENOUS | Status: DC | PRN
  Administered 2016-04-02 (×2): via INTRAVENOUS

## 2016-04-02 MED ORDER — MIDAZOLAM HCL 2 MG/2ML IJ SOLN
INTRAMUSCULAR | Status: AC
  Filled 2016-04-02: qty 2

## 2016-04-02 MED ORDER — LABETALOL HCL 5 MG/ML IV SOLN
INTRAVENOUS | Status: AC
  Filled 2016-04-02: qty 4

## 2016-04-02 MED ORDER — PHENYLEPHRINE 40 MCG/ML (10ML) SYRINGE FOR IV PUSH (FOR BLOOD PRESSURE SUPPORT)
PREFILLED_SYRINGE | INTRAVENOUS | Status: DC | PRN
  Administered 2016-04-02: 40 ug via INTRAVENOUS
  Administered 2016-04-02: 80 ug via INTRAVENOUS
  Administered 2016-04-02: 20 ug via INTRAVENOUS
  Administered 2016-04-02: 40 ug via INTRAVENOUS

## 2016-04-02 MED ORDER — PROTAMINE SULFATE 10 MG/ML IV SOLN
INTRAVENOUS | Status: AC
  Filled 2016-04-02: qty 25

## 2016-04-02 MED ORDER — LIDOCAINE HCL (PF) 1 % IJ SOLN
INTRAMUSCULAR | Status: AC
  Filled 2016-04-02: qty 30

## 2016-04-02 MED ORDER — SODIUM CHLORIDE 0.9 % IV SOLN
INTRAVENOUS | Status: DC
  Administered 2016-04-02: 13:00:00 via INTRAVENOUS

## 2016-04-02 MED ORDER — LACTATED RINGERS IV SOLN
INTRAVENOUS | Status: DC | PRN
  Administered 2016-04-02: 08:00:00 via INTRAVENOUS

## 2016-04-02 MED ORDER — SUGAMMADEX SODIUM 200 MG/2ML IV SOLN
INTRAVENOUS | Status: DC | PRN
  Administered 2016-04-02: 100 mg via INTRAVENOUS

## 2016-04-02 MED ORDER — SODIUM CHLORIDE 0.9 % IV SOLN: INTRAVENOUS | Status: DC

## 2016-04-02 MED ORDER — SENNOSIDES-DOCUSATE SODIUM 8.6-50 MG PO TABS: 1.0000 | ORAL_TABLET | Freq: Every evening | ORAL | Status: DC | PRN

## 2016-04-02 MED ORDER — PHENYLEPHRINE HCL 10 MG/ML IJ SOLN
INTRAVENOUS | Status: DC | PRN
  Administered 2016-04-02: 20 ug/min via INTRAVENOUS

## 2016-04-02 MED ORDER — LABETALOL HCL 5 MG/ML IV SOLN
INTRAVENOUS | Status: DC | PRN
  Administered 2016-04-02: 5 mg via INTRAVENOUS

## 2016-04-02 MED ORDER — TRAMADOL HCL 50 MG PO TABS: 50.0000 mg | ORAL_TABLET | Freq: Four times a day (QID) | ORAL | Status: DC | PRN

## 2016-04-02 MED ORDER — LIDOCAINE 2% (20 MG/ML) 5 ML SYRINGE
INTRAMUSCULAR | Status: AC
  Filled 2016-04-02: qty 5

## 2016-04-02 MED ORDER — MAGNESIUM SULFATE 2 GM/50ML IV SOLN: 2.0000 g | Freq: Every day | INTRAVENOUS | Status: DC | PRN

## 2016-04-02 MED ORDER — VANCOMYCIN HCL IN DEXTROSE 1-5 GM/200ML-% IV SOLN: 1000.0000 mg | INTRAVENOUS | Status: DC

## 2016-04-02 MED ORDER — SUCCINYLCHOLINE CHLORIDE 200 MG/10ML IV SOSY
PREFILLED_SYRINGE | INTRAVENOUS | Status: AC
  Filled 2016-04-02: qty 10

## 2016-04-02 MED ORDER — GLYCOPYRROLATE 0.2 MG/ML IJ SOLN
INTRAMUSCULAR | Status: DC | PRN
  Administered 2016-04-02: 0.1 mg via INTRAVENOUS

## 2016-04-02 MED ORDER — ONDANSETRON HCL 4 MG/2ML IJ SOLN
INTRAMUSCULAR | Status: AC
  Filled 2016-04-02: qty 2

## 2016-04-02 MED ORDER — IBUPROFEN 200 MG PO TABS
200.0000 mg | ORAL_TABLET | ORAL | Status: DC | PRN
  Administered 2016-04-03 (×2): 200 mg via ORAL
  Filled 2016-04-02 (×2): qty 1

## 2016-04-02 MED ORDER — HEPARIN SODIUM (PORCINE) 1000 UNIT/ML IJ SOLN
INTRAMUSCULAR | Status: AC
  Filled 2016-04-02: qty 1

## 2016-04-02 MED ORDER — VANCOMYCIN HCL IN DEXTROSE 1-5 GM/200ML-% IV SOLN
1000.0000 mg | INTRAVENOUS | Status: DC
  Filled 2016-04-02: qty 200

## 2016-04-02 MED ORDER — BISACODYL 5 MG PO TBEC: 5.0000 mg | DELAYED_RELEASE_TABLET | Freq: Every day | ORAL | Status: DC | PRN

## 2016-04-02 MED ORDER — EPHEDRINE SULFATE-NACL 50-0.9 MG/10ML-% IV SOSY
PREFILLED_SYRINGE | INTRAVENOUS | Status: DC | PRN
  Administered 2016-04-02: 5 mg via INTRAVENOUS
  Administered 2016-04-02: 10 mg via INTRAVENOUS

## 2016-04-02 MED ORDER — PROPOFOL 10 MG/ML IV BOLUS
INTRAVENOUS | Status: DC | PRN
Start: 2016-04-02 — End: 2016-04-02
  Administered 2016-04-02: 30 mg via INTRAVENOUS
  Administered 2016-04-02: 100 mg via INTRAVENOUS

## 2016-04-02 MED ORDER — SODIUM CHLORIDE 0.9 % IV SOLN
INTRAVENOUS | Status: DC | PRN
  Administered 2016-04-02: .1 ug/kg/min via INTRAVENOUS

## 2016-04-02 MED ORDER — LABETALOL HCL 5 MG/ML IV SOLN
10.0000 mg | INTRAVENOUS | Status: DC | PRN
  Filled 2016-04-02: qty 4

## 2016-04-02 MED ORDER — PROTAMINE SULFATE 10 MG/ML IV SOLN
INTRAVENOUS | Status: DC | PRN
  Administered 2016-04-02: 50 mg via INTRAVENOUS

## 2016-04-02 MED ORDER — SUCCINYLCHOLINE CHLORIDE 200 MG/10ML IV SOSY
PREFILLED_SYRINGE | INTRAVENOUS | Status: DC | PRN
  Administered 2016-04-02: 100 mg via INTRAVENOUS

## 2016-04-02 MED ORDER — PHENOL 1.4 % MT LIQD
1.0000 | OROMUCOSAL | Status: DC | PRN
  Administered 2016-04-02: 1 via OROMUCOSAL
  Filled 2016-04-02: qty 177

## 2016-04-02 MED ORDER — ENOXAPARIN SODIUM 40 MG/0.4ML ~~LOC~~ SOLN
40.0000 mg | SUBCUTANEOUS | Status: DC
  Administered 2016-04-03: 40 mg via SUBCUTANEOUS
  Filled 2016-04-02: qty 0.4

## 2016-04-02 MED ORDER — NITROGLYCERIN 0.2 MG/ML ON CALL CATH LAB
INTRAVENOUS | Status: DC | PRN
  Administered 2016-04-02 (×3): 40 ug via INTRAVENOUS

## 2016-04-02 MED ORDER — LOSARTAN POTASSIUM 50 MG PO TABS
100.0000 mg | ORAL_TABLET | Freq: Every morning | ORAL | Status: DC
  Administered 2016-04-03 – 2016-04-04 (×2): 100 mg via ORAL
  Filled 2016-04-02 (×2): qty 2

## 2016-04-02 MED ORDER — VANCOMYCIN HCL IN DEXTROSE 1-5 GM/200ML-% IV SOLN
1000.0000 mg | INTRAVENOUS | Status: AC
  Administered 2016-04-02: 1000 mg via INTRAVENOUS
  Filled 2016-04-02: qty 200

## 2016-04-02 MED ORDER — FENTANYL CITRATE (PF) 100 MCG/2ML IJ SOLN
INTRAMUSCULAR | Status: DC | PRN
  Administered 2016-04-02: 100 ug via INTRAVENOUS
  Administered 2016-04-02: 50 ug via INTRAVENOUS

## 2016-04-02 MED ORDER — DOCUSATE SODIUM 100 MG PO CAPS
100.0000 mg | ORAL_CAPSULE | Freq: Every day | ORAL | Status: DC
  Administered 2016-04-03 – 2016-04-04 (×2): 100 mg via ORAL
  Filled 2016-04-02 (×2): qty 1

## 2016-04-02 MED ORDER — HYDROMORPHONE HCL 2 MG/ML IJ SOLN
INTRAMUSCULAR | Status: AC
  Filled 2016-04-02: qty 1

## 2016-04-02 MED ORDER — PROMETHAZINE HCL 25 MG/ML IJ SOLN: 6.2500 mg | INTRAMUSCULAR | Status: DC | PRN

## 2016-04-02 MED ORDER — POTASSIUM CHLORIDE CRYS ER 20 MEQ PO TBCR: 20.0000 meq | EXTENDED_RELEASE_TABLET | Freq: Every day | ORAL | Status: DC | PRN

## 2016-04-02 SURGICAL SUPPLY — 43 items
ADH SKN CLS APL DERMABOND .7 (GAUZE/BANDAGES/DRESSINGS) ×1
CANISTER SUCTION 2500CC (MISCELLANEOUS) ×2 IMPLANT
CANNULA VESSEL 3MM 2 BLNT TIP (CANNULA) ×3 IMPLANT
CATH ROBINSON RED A/P 18FR (CATHETERS) ×2 IMPLANT
CLIP LIGATING EXTRA MED SLVR (CLIP) ×2 IMPLANT
CLIP LIGATING EXTRA SM BLUE (MISCELLANEOUS) ×2 IMPLANT
CRADLE DONUT ADULT HEAD (MISCELLANEOUS) ×2 IMPLANT
DECANTER SPIKE VIAL GLASS SM (MISCELLANEOUS) IMPLANT
DERMABOND ADVANCED (GAUZE/BANDAGES/DRESSINGS) ×1
DERMABOND ADVANCED .7 DNX12 (GAUZE/BANDAGES/DRESSINGS) ×1 IMPLANT
DRAIN HEMOVAC 1/8 X 5 (WOUND CARE) IMPLANT
ELECT REM PT RETURN 9FT ADLT (ELECTROSURGICAL) ×2
ELECTRODE REM PT RTRN 9FT ADLT (ELECTROSURGICAL) ×1 IMPLANT
EVACUATOR SILICONE 100CC (DRAIN) IMPLANT
GLOVE BIOGEL PI IND STRL 6.5 (GLOVE) IMPLANT
GLOVE BIOGEL PI IND STRL 7.0 (GLOVE) IMPLANT
GLOVE BIOGEL PI IND STRL 7.5 (GLOVE) IMPLANT
GLOVE BIOGEL PI INDICATOR 6.5 (GLOVE) ×1
GLOVE BIOGEL PI INDICATOR 7.0 (GLOVE) ×1
GLOVE BIOGEL PI INDICATOR 7.5 (GLOVE) ×1
GLOVE ECLIPSE 7.0 STRL STRAW (GLOVE) ×1 IMPLANT
GLOVE SS BIOGEL STRL SZ 7.5 (GLOVE) ×1 IMPLANT
GLOVE SUPERSENSE BIOGEL SZ 7.5 (GLOVE) ×1
GLOVE SURG SS PI 6.0 STRL IVOR (GLOVE) ×1 IMPLANT
GOWN STRL REUS W/ TWL LRG LVL3 (GOWN DISPOSABLE) ×3 IMPLANT
GOWN STRL REUS W/TWL LRG LVL3 (GOWN DISPOSABLE) ×6
KIT BASIN OR (CUSTOM PROCEDURE TRAY) ×2 IMPLANT
KIT ROOM TURNOVER OR (KITS) ×2 IMPLANT
NEEDLE 22X1 1/2 (OR ONLY) (NEEDLE) IMPLANT
NS IRRIG 1000ML POUR BTL (IV SOLUTION) ×4 IMPLANT
PACK CAROTID (CUSTOM PROCEDURE TRAY) ×2 IMPLANT
PAD ARMBOARD 7.5X6 YLW CONV (MISCELLANEOUS) ×4 IMPLANT
PATCH HEMASHIELD 8X75 (Vascular Products) ×1 IMPLANT
SHUNT CAROTID BYPASS 10 (VASCULAR PRODUCTS) ×1 IMPLANT
SUT ETHILON 3 0 PS 1 (SUTURE) IMPLANT
SUT PROLENE 6 0 CC (SUTURE) ×2 IMPLANT
SUT SILK 3 0 (SUTURE)
SUT SILK 3-0 18XBRD TIE 12 (SUTURE) IMPLANT
SUT VIC AB 3-0 SH 27 (SUTURE) ×4
SUT VIC AB 3-0 SH 27X BRD (SUTURE) ×2 IMPLANT
SUT VICRYL 4-0 PS2 18IN ABS (SUTURE) ×2 IMPLANT
SYR CONTROL 10ML LL (SYRINGE) IMPLANT
WATER STERILE IRR 1000ML POUR (IV SOLUTION) ×2 IMPLANT

## 2016-04-02 NOTE — Interval H&P Note (Signed)
History and Physical Interval Note:  04/02/2016 7:41 AM  Sophia Coleman  has presented today for surgery, with the diagnosis of Left Internal Carotid Artery Stenosis I65.22  The various methods of treatment have been discussed with the patient and family. After consideration of risks, benefits and other options for treatment, the patient has consented to  Procedure(s): ENDARTERECTOMY CAROTID (Left) as a surgical intervention .  The patient's history has been reviewed, patient examined, no change in status, stable for surgery.  I have reviewed the patient's chart and labs.  Questions were answered to the patient's satisfaction.     Curt Jews

## 2016-04-02 NOTE — Anesthesia Procedure Notes (Signed)
Procedure Name: Intubation Date/Time: 04/02/2016 8:40 AM Performed by: Rejeana Brock L Pre-anesthesia Checklist: Patient identified, Emergency Drugs available, Suction available, Patient being monitored and Timeout performed Patient Re-evaluated:Patient Re-evaluated prior to inductionOxygen Delivery Method: Circle system utilized Preoxygenation: Pre-oxygenation with 100% oxygen Intubation Type: IV induction Ventilation: Mask ventilation without difficulty Laryngoscope Size: Mac and 3 Grade View: Grade I Tube type: Oral Tube size: 7.0 mm Number of attempts: 1 Airway Equipment and Method: Stylet and LTA kit utilized Placement Confirmation: ETT inserted through vocal cords under direct vision,  positive ETCO2,  CO2 detector and breath sounds checked- equal and bilateral Secured at: 22 cm Tube secured with: Tape Dental Injury: Teeth and Oropharynx as per pre-operative assessment

## 2016-04-02 NOTE — Progress Notes (Signed)
Pharmacy Antibiotic Note  Sophia Coleman is a 75 y.o. female admitted on 04/02/2016 with surgical prophylaxis s/p L carotid endarterectomy & Dacron patch angioplasty.  Pharmacy has been consulted for adjust antibiotics for renal function.  Pt weighs 44kg and received Vancomycin 1000mg  IV x 1 this AM pre-op at 0830, with estimated CrCl 53ml/min.  This dose should cover pt for > 24hr and no further dosing will be needed.  Plan: D/C Vancomycin  Height: 5' 1.5" (156.2 cm) Weight: 99 lb 13.9 oz (45.3 kg) IBW/kg (Calculated) : 48.95  Temp (24hrs), Avg:97.8 F (36.6 C), Min:97.5 F (36.4 C), Max:98.2 F (36.8 C)   Recent Labs Lab 04/01/16 1004  WBC 7.6  CREATININE 0.91    Estimated Creatinine Clearance: 39.4 mL/min (by C-G formula based on SCr of 0.91 mg/dL).    Allergies  Allergen Reactions  . Augmentin [Amoxicillin-Pot Clavulanate] Other (See Comments)    "SEVERE SWEATS"  . Vicodin [Hydrocodone-Acetaminophen] Nausea And Vomiting  . Quinolones Nausea And Vomiting    Thank you for allowing pharmacy to be a part of this patient's care.   Gracy Bruins, PharmD Clinical Pharmacist [facility]

## 2016-04-02 NOTE — H&P (View-Only) (Signed)
Vascular and Vein Specialist of [place]  Patient name: Sophia Coleman MRN: [id number] DOB: [birth year] Sex: female  REASON FOR CONSULT: Evaluation of left carotid stenosis  HPI: Sophia Coleman is a 75 y.o. female, who is here today for evaluation of left carotid stenosis. She was found to have bruit on physical exam and underwent a duplex at outlying hospital. This suggested severe left internal carotid artery stenosis with no significant right carotid stenosis. She is seen today for further discussion. She specifically denies any prior history of amaurosis fugax, transient ischemic attack or stroke. She does have a history of hypertension. She unfortunately continues to smoke cigarettes.  Past Medical History:  Diagnosis Date  . Arthritis   . Chronic kidney disease   . COPD (chronic obstructive pulmonary disease) ([place])   . Depression   . Hypertension     History reviewed. No pertinent family history.  SOCIAL HISTORY: Social History   Social History  . Marital status: Widowed    Spouse name: N/A  . Number of children: N/A  . Years of education: N/A   Occupational History  . Not on file.   Social History Main Topics  . Smoking status: Current Every Day Smoker    Packs/day: 1.00    Years: 30.00  . Smokeless tobacco: Never Used  . Alcohol use No  . Drug use: No  . Sexual activity: Not on file   Other Topics Concern  . Not on file   Social History Narrative  . No narrative on file    Allergies  Allergen Reactions  . Augmentin [Amoxicillin-Pot Clavulanate] Other (See Comments)    Reaction: severe sweats  . Quinolones Nausea And Vomiting    Current Outpatient Prescriptions  Medication Sig Dispense Refill  . aspirin 81 MG tablet Take 81 mg by mouth daily.    Marland Kitchen losartan (COZAAR) 100 MG tablet Take 100 mg by mouth every morning.    Marland Kitchen PARoxetine (PAXIL) 20 MG tablet Take 20 mg by mouth every morning.    . Black Cohosh 40 MG  CAPS Take 1 capsule by mouth every morning.    . Calcium Carbonate-Vitamin D (CALTRATE 600+D) 600-400 MG-UNIT per tablet Take 1 tablet by mouth 2 (two) times daily.     Marland Kitchen ibuprofen (ADVIL,MOTRIN) 200 MG tablet Take 200 mg by mouth every 4 (four) hours as needed. For pain    . ketorolac (ACULAR) 0.4 % SOLN     . ofloxacin (OCUFLOX) 0.3 % ophthalmic solution     . Omega-3 Fatty Acids (FISH OIL) 1200 MG CAPS Take 1 capsule by mouth every morning.    . prednisoLONE acetate (PRED FORTE) 1 % ophthalmic suspension     . zoledronic acid (RECLAST) 5 MG/100ML SOLN injection Inject 5 mg into the vein as directed. Once a year injection at [facility]     No current facility-administered medications for this visit.     REVIEW OF SYSTEMS:  [X]  denotes positive finding, [ ]  denotes negative finding Cardiac  Comments:  Chest pain or chest pressure:    Shortness of breath upon exertion:    Short of breath when lying flat:    Irregular heart rhythm:        Vascular    Pain in calf, thigh, or hip brought on by ambulation: x   Pain in feet at night that wakes you up from your sleep:     Blood clot in your veins:    Leg swelling:  Pulmonary    Oxygen at home:    Productive cough:     Wheezing:         Neurologic    Sudden weakness in arms or legs:     Sudden numbness in arms or legs:     Sudden onset of difficulty speaking or slurred speech:    Temporary loss of vision in one eye:     Problems with dizziness:         Gastrointestinal    Blood in stool:     Vomited blood:         Genitourinary    Burning when urinating:     Blood in urine:        Psychiatric    Major depression:         Hematologic    Bleeding problems:    Problems with blood clotting too easily:        Skin    Rashes or ulcers:        Constitutional    Fever or chills:      PHYSICAL EXAM: Vitals:   03/12/16 1042 03/12/16 1045 03/12/16 1046  BP: (!) 142/62 (!) 154/69 (!) 149/67  Pulse: 60    Resp: 18     Temp: (!) 96.8 F (36 C)    TempSrc: Oral    SpO2: 100%    Weight: 95 lb 11.2 oz (43.4 kg)    Height: 4' 11.5" (1.511 m)      GENERAL: The patient is a well-nourished female, in no acute distress. The vital signs are documented above. CARDIOVASCULAR: Arch left carotid bruit and no bruit on the right. Heart is regular rate and rhythm. 2+ radial and 2+ dorsalis pedis pulses bilaterally PULMONARY: There is good air exchange  ABDOMEN: Soft and non-tender  MUSCULOSKELETAL: There are no major deformities or cyanosis. NEUROLOGIC: No focal weakness or paresthesias are detected. SKIN: There are no ulcers or rashes noted. PSYCHIATRIC: The patient has a normal affect.  DATA:  I reviewed her duplex from outlying hospital. We also repeated her left carotid duplex in our office to determine if she was a candidate for surgery based on duplex alone. This did confirm critical stenosis in her left internal carotid artery. The internal carotid artery above the bifurcation became normal.  MEDICAL ISSUES: Severe asymptomatic left internal carotid artery stenosis. The patient is right handed. I have recommended endarterectomy for reduction of stroke risk. Expanded procedure is a typical one day hospitalization. Also explain the 1-1-1/2% risk of stroke with surgery. She understands and wished to proceed as soon as possible.   Rosetta Posner, MD FACS Vascular and Vein Specialists of [facility] Tel [phone] Pager [phone]

## 2016-04-02 NOTE — Anesthesia Postprocedure Evaluation (Signed)
Anesthesia Post Note  Patient: Sophia Coleman  Procedure(s) Performed: Procedure(s) (LRB): ENDARTERECTOMY LEFT CAROTID ARTERY (Left) PATCH ANGIOPLASTY LEFT CAROTID ARTERY USING HEMASHIELD PLATINUM FINESS PATCH (Left)  Patient location during evaluation: PACU Anesthesia Type: General Level of consciousness: sedated Pain management: pain level controlled Vital Signs Assessment: post-procedure vital signs reviewed and stable Respiratory status: spontaneous breathing and respiratory function stable Cardiovascular status: stable Anesthetic complications: no    Last Vitals:  Vitals:   04/02/16 1121 04/02/16 1136  BP: 123/62 (!) 121/56  Pulse: 72 71  Resp: 17 15  Temp:      Last Pain:  Vitals:   04/02/16 1120  TempSrc:   PainSc: 3                  Zoi Devine DANIEL

## 2016-04-02 NOTE — Transfer of Care (Signed)
Immediate Anesthesia Transfer of Care Note  Patient: Sophia Coleman  Procedure(s) Performed: Procedure(s): ENDARTERECTOMY LEFT CAROTID ARTERY (Left) PATCH ANGIOPLASTY LEFT CAROTID ARTERY USING HEMASHIELD PLATINUM FINESS PATCH (Left)  Patient Location: PACU  Anesthesia Type:General  Level of Consciousness: awake, alert , oriented and patient cooperative  Airway & Oxygen Therapy: Patient Spontanous Breathing and Patient connected to nasal cannula oxygen  Post-op Assessment: Report given to RN, Post -op Vital signs reviewed and stable, Patient moving all extremities X 4 and Patient able to stick tongue midline  Post vital signs: Reviewed and stable  Last Vitals:  Vitals:   04/02/16 0720  BP: (!) 143/62  Pulse: 61  Resp: 18  Temp: 36.8 C    Last Pain:  Vitals:   04/02/16 0720  TempSrc: Oral         Complications: No apparent anesthesia complications

## 2016-04-02 NOTE — Care Management Note (Signed)
Case Management Note  Patient Details  Name: ARIANN LAURAIN MRN: [id number] Date of Birth: 04-21-1941  Subjective/Objective:    S/p   Left carotid endarterectomy , NCM spoke with patient, she states she lives alone, pta indep.  She has a pcp, she has medication coverage and she has transportation at [employer].  Patient has no other needs.                Action/Plan:   Expected Discharge Date:  04/03/16               Expected Discharge Plan:  Home/Self Care  In-House Referral:     Discharge planning Services  CM Consult  Post Acute Care Choice:    Choice offered to:     DME Arranged:    DME Agency:     HH Arranged:    HH Agency:     Status of Service:  Completed, signed off  If discussed at [employer] of Stay Meetings, dates discussed:    Additional Comments:  Zenon Mayo, RN 04/02/2016, 2:05 PM

## 2016-04-02 NOTE — Op Note (Signed)
    OPERATIVE REPORT  DATE OF SURGERY: 04/02/2016  PATIENT: Sophia Coleman, 75 y.o. female MRN: [id number]  DOB: 1940-08-30  PRE-OPERATIVE DIAGNOSIS: Severe asymptomatic left internal carotid artery stenosis  POST-OPERATIVE DIAGNOSIS:  Same  PROCEDURE: Left carotid endarterectomy and Dacron patch angioplasty  SURGEON:  Curt Jews, M.D.  PHYSICIAN ASSISTANT: Dyanne Iha PA-C  ANESTHESIA:  Gen.  EBL: 100 ml  Total I/O In: 1600 [I.V.:1600] Out: 100 [Blood:100]  BLOOD ADMINISTERED: None  DRAINS: None  SPECIMEN: None  COUNTS CORRECT:  YES  PLAN OF CARE: PACU stable and neurologically intact   PATIENT DISPOSITION:  PACU - hemodynamically stable  PROCEDURE DETAILS: Patient was taken to the operative placed supine position where the area of the left  Sterile fashion. An incision was made anterior to the sternocleidomastoid and carried down to the platysma with electrocautery. The sternocleidomastoid was reflected posteriorly and the carotid sheath was opened. The facial vein was ligated with 3-0 silk ties and divided. The vagus and hypoglossal nerves were identified and preserved. The patient had extreme calcification at the carotid bifurcation. The internal carotid above the normal caliber with minimal atherosclerotic change. This was encircled with an umbilical tape and Rummel tourniquet. The external carotid was encircled with a blue vessel loop and the superior thyroid artery was encircled with 2-0 silk Potts tie. The common carotid artery had moderate amount of atherosclerotic changes. There was plaque in the common carotid artery several centimeters below the bifurcation. For this reason the incision was extended proximally towards the sternal notch to give exposure to the artery below this level which felt normal. The patient was given 5000 units intravenous heparin and after adequate circulation time the internal/external common carotid arteries were occluded. The common  carotid artery was opened with 11 blade some ulcerative with Potts scissors onto the internal carotid. A 10 shunt was passed up the internal carotid mouth and down the common carotid were secured with Rummel tourniquet. The endarterectomy skin on the common carotid artery and the plaque was divided proximally with Potts scissors. The endarterectomy was continued onto the bifurcation and the external carotid was endarterectomized eversion technique and the internal carotid was endarterectomized in an open fashion. Remaining atheromatous debris was removed from the endarterectomy plane. Initial Dacron patch was brought onto the field and was sewn as a Dacron patch angioplasty with a running 6-0 Prolene suture. Prior to completion of the closure the shunt was removed and the usual flushing maneuvers were undertaken. The anastomosis was completed and flow was restored restored first to the external and the internal carotid artery. Excellent flow characteristics were noted with hand-held Doppler in the internal and external carotid arteries. The patient was given 50 mg of protamine to reverse the heparin. Irrigated with saline and hemostasis tablet cautery. The wounds were closed with several interrupted 3-0 Vicryl sutures to reapproximate the sternocleidomastoid over the carotid sheath. Next the platysma was closed with a running 3-0 Vicryl suture. Finally the skin was closed with a 4-0 subcuticular Vicryl stitch. Dermabond was applied. The patient in the operating room and transferred to the recovery room neurologically intact   Rosetta Posner, M.D., [facility] 04/02/2016 12:38 PM

## 2016-04-03 ENCOUNTER — Encounter (HOSPITAL_COMMUNITY): Payer: Self-pay | Admitting: Vascular Surgery

## 2016-04-03 LAB — CBC
HCT: 29 % — ABNORMAL LOW (ref 36.0–46.0)
HEMOGLOBIN: 9.2 g/dL — AB (ref 12.0–15.0)
MCH: 26.1 pg (ref 26.0–34.0)
MCHC: 31.7 g/dL (ref 30.0–36.0)
MCV: 82.4 fL (ref 78.0–100.0)
Platelets: 259 10*3/uL (ref 150–400)
RBC: 3.52 MIL/uL — AB (ref 3.87–5.11)
RDW: 17.3 % — ABNORMAL HIGH (ref 11.5–15.5)
WBC: 13.7 10*3/uL — ABNORMAL HIGH (ref 4.0–10.5)

## 2016-04-03 LAB — BASIC METABOLIC PANEL
Anion gap: 7 (ref 5–15)
BUN: 16 mg/dL (ref 6–20)
CHLORIDE: 107 mmol/L (ref 101–111)
CO2: 24 mmol/L (ref 22–32)
Calcium: 9 mg/dL (ref 8.9–10.3)
Creatinine, Ser: 0.72 mg/dL (ref 0.44–1.00)
GFR calc Af Amer: >60 mL/min (ref >60)
GFR calc non Af Amer: >60 mL/min (ref >60)
GLUCOSE: 120 mg/dL — AB (ref 65–99)
POTASSIUM: 4.4 mmol/L (ref 3.5–5.1)
SODIUM: 138 mmol/L (ref 135–145)

## 2016-04-03 NOTE — Progress Notes (Signed)
Subjective: Interval History: none.. Mild soreness. Sitting up in chair Objective: Vital signs in last 24 hours: Temp:  [97.3 F (36.3 C)-98.2 F (36.8 C)] 97.3 F (36.3 C) (11/09 0410) Pulse Rate:  [58-78] 69 (11/09 0410) Resp:  [12-18] 14 (11/09 0410) BP: (104-143)/(49-81) 123/61 (11/09 0410) SpO2:  [89 %-99 %] 93 % (11/09 0410) Arterial Line BP: (119-163)/(48-63) 148/56 (11/08 1920) Weight:  [97 lb (44 kg)-99 lb 13.9 oz (45.3 kg)] 99 lb 13.9 oz (45.3 kg) (11/08 1202)  Intake/Output from previous day: 11/08 0701 - 11/09 0700 In: 2950 [P.O.:600; I.V.:2350] Out: 850 [Urine:750; Blood:100] Intake/Output this shift: Total I/O In: 420 [P.O.:120; I.V.:300] Out: 150 [Urine:150]  Left neck incision without hematoma. Neurologically intact.  Lab Results:  Recent Labs  04/02/16 1630 04/03/16 0410  WBC 13.1* 13.7*  HGB 9.6* 9.2*  HCT 30.7* 29.0*  PLT 261 259   BMET  Recent Labs  04/01/16 1004 04/02/16 1630 04/03/16 0410  NA 139  --  138  K 4.1  --  4.4  CL 107  --  107  CO2 23  --  24  GLUCOSE 104*  --  120*  BUN 21*  --  16  CREATININE 0.91 0.81 0.72  CALCIUM 10.1  --  9.0    Studies/Results: No results found. Anti-infectives: Anti-infectives    Start     Dose/Rate Route Frequency Ordered Stop   04/03/16 2030  vancomycin (VANCOCIN) IVPB 1000 mg/200 mL premix  Status:  Discontinued     1,000 mg 200 mL/hr over 60 Minutes Intravenous To Surgery 04/02/16 1209 04/02/16 1215   04/02/16 2030  vancomycin (VANCOCIN) IVPB 1000 mg/200 mL premix  Status:  Discontinued     1,000 mg 200 mL/hr over 60 Minutes Intravenous To Surgery 04/02/16 1215 04/02/16 1323   04/02/16 0605  vancomycin (VANCOCIN) IVPB 1000 mg/200 mL premix     1,000 mg 200 mL/hr over 60 Minutes Intravenous 60 min pre-op 04/02/16 0605 04/02/16 0940      Assessment/Plan: s/p Procedure(s): ENDARTERECTOMY LEFT CAROTID ARTERY (Left) PATCH ANGIOPLASTY LEFT CAROTID ARTERY USING HEMASHIELD PLATINUM FINESS  PATCH (Left) Stable overall. O2 saturations 88% on room air. Has voided. Will mobilize today and discharge after breakfast. Follow-up in 2-3 weeks in the office   LOS: 1 day   Sophia Coleman 04/03/2016, 6:41 AM

## 2016-04-03 NOTE — Progress Notes (Deleted)
Explained and discussed discharge instructions to patient, prescriptions, follow up appts. Given to pt. Exit care note given to pt. Pt going home with mom via w/c with belongings.

## 2016-04-04 MED ORDER — ACETAMINOPHEN 325 MG PO TABS
650.0000 mg | ORAL_TABLET | Freq: Four times a day (QID) | ORAL | Status: DC | PRN
  Administered 2016-04-04: 650 mg via ORAL
  Filled 2016-04-04: qty 2

## 2016-04-04 NOTE — Progress Notes (Signed)
Pt ambulating in room independently, but does not want to walk in hall.  1L [place] kept on throughout night.  Pt currently on RA 86-88%.  Will continue to monitor.

## 2016-04-04 NOTE — Progress Notes (Signed)
Pt education completed to include future appointments, current prescriptions and medications, and doctors discharge instructions. Pt alert and oriented, vital signs stable. Pt discharged via wheelchair with nursing staff. Temp: 98.3 F (36.8 C) (11/10 0749) Temp Source: Oral (11/10 0749) BP: 162/64 (11/10 0800) Pulse Rate: 75 (11/10 0800)  Wray Kearns 04/04/2016 1200

## 2016-04-04 NOTE — Progress Notes (Signed)
[facility] [place]  Subjective  - she was having desaturations on RA in the low 70's.   Objective (!) 162/64 75 98.3 F (36.8 C) (Oral) 20 (!) 89%  Intake/Output Summary (Last 24 hours) at 04/04/16 0830 Last data filed at 04/03/16 1839  Gross per 24 hour  Intake             1320 ml  Output                0 ml  Net             1320 ml    This am O2 SAT 88-90 Left neck incisions clean and dry, without hematoma No tongue deviation and smile is symetric  Assessment/Planning: POD # 2 Left CEA  O2 SAT stable for discharge Tol PO's ambulating and voided  Sophia Coleman Sophia Coleman 04/04/2016 8:30 AM --  Laboratory Lab Results:  Recent Labs  04/02/16 1630 04/03/16 0410  WBC 13.1* 13.7*  HGB 9.6* 9.2*  HCT 30.7* 29.0*  PLT 261 259   BMET  Recent Labs  04/01/16 1004 04/02/16 1630 04/03/16 0410  NA 139  --  138  K 4.1  --  4.4  CL 107  --  107  CO2 23  --  24  GLUCOSE 104*  --  120*  BUN 21*  --  16  CREATININE 0.91 0.81 0.72  CALCIUM 10.1  --  9.0    COAG Lab Results  Component Value Date   INR 0.92 04/01/2016   No results found for: PTT

## 2016-04-08 NOTE — Discharge Summary (Signed)
[facility] Discharge Summary   Patient ID:  Sophia Coleman MRN: [id number] DOB/AGE: [birth year] 75 y.o.  Admit date: 04/02/2016 Discharge date: 04/04/2016 Date of Surgery: 04/02/2016 Surgeon: Surgeon(s): Rosetta Posner, MD  Admission Diagnosis: Left Internal Carotid Artery Stenosis I65.22  Discharge Diagnoses:  Left Internal Carotid Artery Stenosis I65.22  Secondary Diagnoses: Past Medical History:  Diagnosis Date  . Anemia   . Anxiety   . Arthritis   . Chronic kidney disease   . COPD (chronic obstructive pulmonary disease) ([place])   . Depression   . Dyspnea   . Heart murmur   . Hypertension   . Pneumonia   . PONV (postoperative nausea and vomiting)     Procedure(s): ENDARTERECTOMY LEFT CAROTID ARTERY PATCH ANGIOPLASTY LEFT CAROTID ARTERY USING HEMASHIELD PLATINUM FINESS PATCH  Discharged Condition: good  HPI:  Sophia Coleman is a 75 y.o. female, who is here today for evaluation of left carotid stenosis. She was found to have bruit on physical exam and underwent a duplex at outlying hospital. This suggested severe left internal carotid artery stenosis with no significant right carotid stenosis. She is seen today for further discussion. She specifically denies any prior history of amaurosis fugax, transient ischemic attack or stroke. She does have a history of hypertension. She unfortunately continues to smoke cigarettes  Hospital Course:  Sophia Coleman is a 75 y.o. female is S/P Left Procedure(s): ENDARTERECTOMY LEFT CAROTID ARTERY PATCH ANGIOPLASTY LEFT CAROTID ARTERY USING HEMASHIELD PLATINUM FINESS PATCH  This am O2 SAT 88-90/ yesterday high 70's to low 80's Left neck incisions clean and dry, without hematoma No tongue deviation and smile is symetric  Assessment/Planning: POD # 2 Left CEA  O2 SAT stable for discharge Tol PO's ambulating and voided  Significant Diagnostic Studies: CBC Lab Results  Component Value Date   WBC 13.7 (H)  04/03/2016   HGB 9.2 (L) 04/03/2016   HCT 29.0 (L) 04/03/2016   MCV 82.4 04/03/2016   PLT 259 04/03/2016    BMET    Component Value Date/Time   NA 138 04/03/2016 0410   K 4.4 04/03/2016 0410   CL 107 04/03/2016 0410   CO2 24 04/03/2016 0410   GLUCOSE 120 (H) 04/03/2016 0410   BUN 16 04/03/2016 0410   CREATININE 0.72 04/03/2016 0410   CALCIUM 9.0 04/03/2016 0410   GFRNONAA >60 04/03/2016 0410   GFRAA >60 04/03/2016 0410   COAG Lab Results  Component Value Date   INR 0.92 04/01/2016     Disposition:  Discharge to :Home Discharge Instructions    Call MD for:  redness, tenderness, or signs of infection (pain, swelling, bleeding, redness, odor or green/yellow discharge around incision site)    Complete by:  As directed    Call MD for:  severe or increased pain, loss or decreased feeling  in affected limb(s)    Complete by:  As directed    Call MD for:  temperature >100.5    Complete by:  As directed    Discharge instructions    Complete by:  As directed    You may shower in 48 hours.   Driving Restrictions    Complete by:  As directed    No driving for 2 weeks   Lifting restrictions    Complete by:  As directed    No lifting for 4 weeks   Resume previous diet    Complete by:  As directed        Medication List  TAKE these medications   aspirin 81 MG tablet Take 81 mg by mouth daily.   CALTRATE 600+D 600-400 MG-UNIT tablet Generic drug:  Calcium Carbonate-Vitamin D Take 1 tablet by mouth 2 (two) times daily.   ibuprofen 200 MG tablet Commonly known as:  ADVIL,MOTRIN Take 200 mg by mouth every 4 (four) hours as needed for mild pain. For pain   losartan 100 MG tablet Commonly known as:  COZAAR Take 100 mg by mouth every morning.   PARoxetine 40 MG tablet Commonly known as:  PAXIL Take 20 mg by mouth daily.      Verbal and written Discharge instructions given to the patient. Wound care per Discharge AVS Follow-up Information    Early, Todd, MD  Follow up in 2 week(s).   Specialties:  Vascular Surgery, Cardiology Why:  office will call Contact information: [place] [id number] [phone]           Signed: Laurence Slate [facility] 04/08/2016, 11:31 AM --- For VQI Registry use --- Instructions: Press F2 to tab through selections.  Delete question if not applicable.   Modified Rankin score at D/C (0-6): Rankin Score=0  IV medication needed for:  1. Hypertension: No 2. Hypotension: No  Post-op Complications: No  1. Post-op CVA or TIA: No  If yes: Event classification (right eye, left eye, right cortical, left cortical, verterobasilar, other):   If yes: Timing of event (intra-op, <6 hrs post-op, >=6 hrs post-op, unknown):   2. CN injury: No  If yes: CN  injuried   3. Myocardial infarction: No  If yes: Dx by (EKG or clinical, Troponin):   4.  CHF: No  5.  Dysrhythmia (new): No  6. Wound infection: No  7. Reperfusion symptoms: No  8. Return to OR: No  If yes: return to OR for (bleeding, neurologic, other CEA incision, other):   Discharge medications: Statin use:  No  for medical reason   ASA use:  Yes Beta blocker use:  No  for medical reason   ACE-Inhibitor use:  No  for medical reason   P2Y12 Antagonist use: [x ] None, [ ]  Plavix, [ ]  Plasugrel, [ ]  Ticlopinine, [ ]  Ticagrelor, [ ]  Other, [ ]  No for medical reason, [ ]  Non-compliant, [ ]  Not-indicated Anti-coagulant use:  [ x] None, [ ]  Warfarin, [ ]  Rivaroxaban, [ ]  Dabigatran, [ ]  Other, [ ]  No for medical reason, [ ]  Non-compliant, [ ]  Not-indicated

## 2016-04-09 ENCOUNTER — Telehealth: Payer: Self-pay

## 2016-04-09 NOTE — Telephone Encounter (Signed)
Phone call from pt.  Reported her left neck is "a little swollen, near the jaw, close to the left ear."  Stated it feels a little "hard."  Stated "it doesn't hurt."  Asking if this is to be expected.  Denied nay recent increase in swelling.  Denied any redness/ warmth.  Denied fever/ chills.  Stated the incision looks good.  Reported there is some firmness on one side of the neck incision.  advised that is normal and part of the healing process.  Advised to continue to watch for any worsening in symptoms.  Encouraged to call if signs of increasing size in swelling, or redness/ warmth of the left neck.  Also enc. To call with any other concerns.  Verb. Understanding.

## 2016-04-23 ENCOUNTER — Encounter: Payer: Self-pay | Admitting: Vascular Surgery

## 2016-04-28 ENCOUNTER — Ambulatory Visit (HOSPITAL_COMMUNITY)
Admission: RE | Admit: 2016-04-28 | Discharge: 2016-04-28 | Disposition: A | Discharge status: Home / Self Care | Payer: Medicare Other | Source: Ambulatory Visit | Attending: Internal Medicine | Admitting: Internal Medicine

## 2016-04-28 DIAGNOSIS — Z1231 Encounter for screening mammogram for malignant neoplasm of breast: Secondary | ICD-10-CM | POA: Diagnosis not present

## 2016-04-29 ENCOUNTER — Ambulatory Visit (INDEPENDENT_AMBULATORY_CARE_PROVIDER_SITE_OTHER): Payer: Medicare Other | Admitting: Vascular Surgery

## 2016-04-29 ENCOUNTER — Encounter: Payer: Self-pay | Admitting: Vascular Surgery

## 2016-04-29 VITALS — BP 138/68 | HR 67 | Temp 97.4°F | Resp 18 | Ht 59.5 in | Wt 97.0 lb

## 2016-04-29 DIAGNOSIS — I6522 Occlusion and stenosis of left carotid artery: Secondary | ICD-10-CM

## 2016-04-29 NOTE — Progress Notes (Signed)
   Patient name: Sophia Coleman MRN: [id number] DOB: [birth year] Sex: female  REASON FOR VISIT: Follow-up carotid endarterectomy on 04/02/2016  HPI: Sophia Coleman is a 75 y.o. female here today for follow-up from left carotid endarterectomy for severe asymptomatic disease. She did well in the hospital was discharged home on postoperative day 1 with no complications. She has continued to do well following her surgery. She denies any focal neurologic deficits and no significant discomfort related to her surgical incision  Current Outpatient Prescriptions  Medication Sig Dispense Refill  . aspirin 81 MG tablet Take 81 mg by mouth daily.    Marland Kitchen ibuprofen (ADVIL,MOTRIN) 200 MG tablet Take 200 mg by mouth every 4 (four) hours as needed for mild pain. For pain    . losartan (COZAAR) 100 MG tablet Take 100 mg by mouth every morning.    Marland Kitchen PARoxetine (PAXIL) 40 MG tablet Take 20 mg by mouth daily.   0  . Calcium Carbonate-Vitamin D (CALTRATE 600+D) 600-400 MG-UNIT per tablet Take 1 tablet by mouth 2 (two) times daily.      No current facility-administered medications for this visit.      PHYSICAL EXAM: Vitals:   04/29/16 1438 04/29/16 1440  BP: (!) 155/74 138/68  Pulse: 67   Resp: 18   Temp: 97.4 F (36.3 C)   TempSrc: Oral   SpO2: 97%   Weight: 97 lb (44 kg)   Height: 4' 11.5" (1.511 m)     GENERAL: The patient is a well-nourished female, in no acute distress. The vital signs are documented above. Left neck wound healing quite nicely. She does have the usual feel healing ridge. She does have a Vicryl suture exposed at the base of the incision and this was removed She is grossly intact neurologically  MEDICAL ISSUES: Stable status post left carotid endarterectomy for severe asymptomatic carotid disease. Will continue usual activities we will see her again in 6 months. She'll notify should he develop any wound problems or neurologic deficits   Rosetta Posner, MD FACS [facility] Tel [phone] Pager [phone]

## 2016-05-01 NOTE — Addendum Note (Signed)
Addended by: Lianne Cure A on: 05/01/2016 09:45 AM   Modules accepted: Orders

## 2016-05-22 DIAGNOSIS — I779 Disorder of arteries and arterioles, unspecified: Secondary | ICD-10-CM | POA: Diagnosis not present

## 2016-05-22 DIAGNOSIS — Z681 Body mass index (BMI) 19 or less, adult: Secondary | ICD-10-CM | POA: Diagnosis not present

## 2016-05-22 DIAGNOSIS — R631 Polydipsia: Secondary | ICD-10-CM | POA: Diagnosis not present

## 2016-05-22 DIAGNOSIS — Z1389 Encounter for screening for other disorder: Secondary | ICD-10-CM | POA: Diagnosis not present

## 2016-05-22 DIAGNOSIS — J984 Other disorders of lung: Secondary | ICD-10-CM | POA: Diagnosis not present

## 2016-06-04 DIAGNOSIS — Z1211 Encounter for screening for malignant neoplasm of colon: Secondary | ICD-10-CM | POA: Diagnosis not present

## 2016-06-10 ENCOUNTER — Other Ambulatory Visit (HOSPITAL_COMMUNITY): Payer: Self-pay | Admitting: Family Medicine

## 2016-06-10 DIAGNOSIS — J984 Other disorders of lung: Secondary | ICD-10-CM

## 2016-06-18 ENCOUNTER — Ambulatory Visit (HOSPITAL_COMMUNITY)
Admission: RE | Admit: 2016-06-18 | Discharge: 2016-06-18 | Disposition: A | Discharge status: Home / Self Care | Payer: Medicare Other | Source: Ambulatory Visit | Attending: Family Medicine | Admitting: Family Medicine

## 2016-06-18 DIAGNOSIS — I700 Atherosclerosis of aorta: Secondary | ICD-10-CM | POA: Insufficient documentation

## 2016-06-18 DIAGNOSIS — R918 Other nonspecific abnormal finding of lung field: Secondary | ICD-10-CM | POA: Insufficient documentation

## 2016-06-18 DIAGNOSIS — J984 Other disorders of lung: Secondary | ICD-10-CM | POA: Diagnosis present

## 2016-06-18 DIAGNOSIS — N200 Calculus of kidney: Secondary | ICD-10-CM | POA: Diagnosis not present

## 2016-06-18 DIAGNOSIS — R631 Polydipsia: Secondary | ICD-10-CM | POA: Insufficient documentation

## 2016-06-18 DIAGNOSIS — R911 Solitary pulmonary nodule: Secondary | ICD-10-CM | POA: Diagnosis not present

## 2016-06-18 MED ORDER — IOPAMIDOL (ISOVUE-300) INJECTION 61%
75.0000 mL | Freq: Once | INTRAVENOUS | Status: AC | PRN
  Administered 2016-06-18: 75 mL via INTRAVENOUS

## 2016-07-10 DIAGNOSIS — D649 Anemia, unspecified: Secondary | ICD-10-CM | POA: Diagnosis not present

## 2016-07-10 DIAGNOSIS — Z681 Body mass index (BMI) 19 or less, adult: Secondary | ICD-10-CM | POA: Diagnosis not present

## 2016-07-10 DIAGNOSIS — J449 Chronic obstructive pulmonary disease, unspecified: Secondary | ICD-10-CM | POA: Diagnosis not present

## 2016-07-10 DIAGNOSIS — R911 Solitary pulmonary nodule: Secondary | ICD-10-CM | POA: Diagnosis not present

## 2016-07-13 DIAGNOSIS — Z1211 Encounter for screening for malignant neoplasm of colon: Secondary | ICD-10-CM | POA: Diagnosis not present

## 2016-09-11 ENCOUNTER — Encounter (INDEPENDENT_AMBULATORY_CARE_PROVIDER_SITE_OTHER): Payer: Self-pay | Admitting: *Deleted

## 2016-09-11 DIAGNOSIS — I739 Peripheral vascular disease, unspecified: Secondary | ICD-10-CM | POA: Diagnosis not present

## 2016-09-11 DIAGNOSIS — K635 Polyp of colon: Secondary | ICD-10-CM | POA: Diagnosis not present

## 2016-09-11 DIAGNOSIS — D509 Iron deficiency anemia, unspecified: Secondary | ICD-10-CM | POA: Diagnosis not present

## 2016-09-11 DIAGNOSIS — Z681 Body mass index (BMI) 19 or less, adult: Secondary | ICD-10-CM | POA: Diagnosis not present

## 2016-09-18 ENCOUNTER — Other Ambulatory Visit (INDEPENDENT_AMBULATORY_CARE_PROVIDER_SITE_OTHER): Payer: Self-pay | Admitting: *Deleted

## 2016-09-18 DIAGNOSIS — Z86010 Personal history of colonic polyps: Secondary | ICD-10-CM | POA: Insufficient documentation

## 2016-10-24 ENCOUNTER — Encounter (INDEPENDENT_AMBULATORY_CARE_PROVIDER_SITE_OTHER): Payer: Self-pay | Admitting: *Deleted

## 2016-10-29 ENCOUNTER — Encounter: Payer: Self-pay | Admitting: Vascular Surgery

## 2016-11-04 ENCOUNTER — Ambulatory Visit (HOSPITAL_COMMUNITY)
Admission: RE | Admit: 2016-11-04 | Discharge: 2016-11-04 | Disposition: A | Discharge status: Home / Self Care | Payer: Medicare Other | Source: Ambulatory Visit | Attending: Vascular Surgery | Admitting: Vascular Surgery

## 2016-11-04 ENCOUNTER — Ambulatory Visit (INDEPENDENT_AMBULATORY_CARE_PROVIDER_SITE_OTHER): Payer: Medicare Other | Admitting: Vascular Surgery

## 2016-11-04 ENCOUNTER — Encounter: Payer: Self-pay | Admitting: Vascular Surgery

## 2016-11-04 VITALS — BP 175/73 | HR 64 | Temp 97.1°F | Resp 16 | Ht 59.5 in | Wt 102.0 lb

## 2016-11-04 DIAGNOSIS — I6522 Occlusion and stenosis of left carotid artery: Secondary | ICD-10-CM | POA: Diagnosis not present

## 2016-11-04 DIAGNOSIS — I779 Disorder of arteries and arterioles, unspecified: Secondary | ICD-10-CM | POA: Insufficient documentation

## 2016-11-04 DIAGNOSIS — I6523 Occlusion and stenosis of bilateral carotid arteries: Secondary | ICD-10-CM | POA: Insufficient documentation

## 2016-11-04 NOTE — Progress Notes (Signed)
[facility] [place]  Patient name: Sophia Coleman MRN: [id number] DOB: [birth year] Sex: female  REASON FOR VISIT: Follow-up left carotid endarterectomy for severe asymptomatic disease on 04/02/2016  HPI: Sophia Coleman is a 76 y.o. female here today for follow-up. She is here with her daughter. She reports no neurologic deficits. Specifically no amaurosis fugax, transient ischemic attack or stroke. She does continue to smoke and had a long discussion her regarding the critical importance of this with her proven peripheral vascular occlusive disease. She does have blood pressure elevated today and will follow-up with her medical doctor to determine if this is isolated  Past Medical History:  Diagnosis Date  . Anemia   . Anxiety   . Arthritis   . Carotid artery occlusion   . Chronic kidney disease   . COPD (chronic obstructive pulmonary disease) ([place])   . Depression   . Dyspnea   . Heart murmur   . Hypertension   . Pneumonia   . PONV (postoperative nausea and vomiting)     Family History  Problem Relation Age of Onset  . Heart murmur Mother   . Heart Problems Mother   . Emphysema Father   . Cancer Sister   . Dementia Brother     SOCIAL HISTORY: Social History  Substance Use Topics  . Smoking status: Current Every Day Smoker    Packs/day: 1.00    Years: 30.00  . Smokeless tobacco: Never Used     Comment: A little less than 1 pk per day  . Alcohol use No    Allergies  Allergen Reactions  . Augmentin [Amoxicillin-Pot Clavulanate] Other (See Comments)    "SEVERE SWEATS"  . Vicodin [Hydrocodone-Acetaminophen] Nausea And Vomiting  . Quinolones Nausea And Vomiting    Current Outpatient Prescriptions  Medication Sig Dispense Refill  . aspirin 81 MG tablet Take 81 mg by mouth daily.    Marland Kitchen ibuprofen (ADVIL,MOTRIN) 200 MG tablet Take 200 mg by mouth every 4 (four) hours as needed for mild pain. For pain    .  losartan (COZAAR) 100 MG tablet Take 100 mg by mouth every morning.    Marland Kitchen PARoxetine (PAXIL) 40 MG tablet Take 20 mg by mouth daily.   0   No current facility-administered medications for this visit.     REVIEW OF SYSTEMS:  [X]  denotes positive finding, [ ]  denotes negative finding Cardiac  Comments:  Chest pain or chest pressure:    Shortness of breath upon exertion: x   Short of breath when lying flat:    Irregular heart rhythm:        Vascular    Pain in calf, thigh, or hip brought on by ambulation: x   Pain in feet at night that wakes you up from your sleep:     Blood clot in your veins:    Leg swelling:           PHYSICAL EXAM: Vitals:   11/04/16 1142 11/04/16 1150  BP: (!) 176/69 (!) 175/73  Pulse: 64   Resp: 16   Temp: 97.1 F (36.2 C)   TempSrc: Oral   SpO2: 98%   Weight: 102 lb (46.3 kg)   Height: 4' 11.5" (1.511 m)     GENERAL: The patient is a well-nourished female, in no acute distress. The vital signs are documented above. CARDIOVASCULAR: Carotid arteries without bruits bilaterally. Well-healed left neck incision. 2+ dorsalis pedis pulses bilaterally PULMONARY: There is good air exchange  MUSCULOSKELETAL: There are no major deformities or cyanosis. NEUROLOGIC: No focal weakness or paresthesias are detected. SKIN: There are no ulcers or rashes noted. PSYCHIATRIC: The patient has a normal affect.  DATA:  Duplex today reveals a patent endarterectomy with mild hyperplasia the distal patch and mid internal carotid artery. Velocities suggestive of 40-59% narrowing bilaterally.  MEDICAL ISSUES: Discuss these findings with patient and her daughter present. Uneventful recovery referred from her carotid surgery. Does have some mild probable and all hyperplasia in the endarterectomy site. She will notify should she develop any neurologic deficits. Otherwise we'll see [place] again in 6 months with repeat carotid duplex. She does report some Clot cramping on the left that  is not related to activity. She has normal pedal pulses do not suspect any peripheral vascular occlusive disease. We will see her again in 6 months    Rosetta Posner, MD [facility] Tel [phone] Pager [phone]

## 2016-11-14 NOTE — Addendum Note (Signed)
Addended by: Lianne Cure A on: 11/14/2016 02:26 PM   Modules accepted: Orders

## 2016-11-25 ENCOUNTER — Encounter (INDEPENDENT_AMBULATORY_CARE_PROVIDER_SITE_OTHER): Payer: Self-pay | Admitting: *Deleted

## 2016-11-25 ENCOUNTER — Telehealth (INDEPENDENT_AMBULATORY_CARE_PROVIDER_SITE_OTHER): Payer: Self-pay | Admitting: *Deleted

## 2016-11-25 DIAGNOSIS — Z86010 Personal history of colonic polyps: Secondary | ICD-10-CM

## 2016-11-25 NOTE — Telephone Encounter (Signed)
Patient needs trilyte -- hx polyps

## 2016-11-27 MED ORDER — PEG 3350-KCL-NA BICARB-NACL 420 G PO SOLR: 4000.0000 mL | Freq: Once | ORAL | 0 refills | Status: AC

## 2016-12-19 ENCOUNTER — Telehealth (INDEPENDENT_AMBULATORY_CARE_PROVIDER_SITE_OTHER): Payer: Self-pay | Admitting: *Deleted

## 2016-12-19 NOTE — Telephone Encounter (Signed)
Referring MD/PCP: fuscio   Procedure: tcs  Reason/Indication:  Hx polyps  Has patient had this procedure before?  Yes, 2013  If so, when, by whom and where?    Is there a family history of colon cancer?  no  Who?  What age when diagnosed?    Is patient diabetic?   no      Does patient have prosthetic heart valve or mechanical valve?  no  Do you have a pacemaker?  no  Has patient ever had endocarditis? no  Has patient had joint replacement within last 12 months?  no  Does patient tend to be constipated or take laxatives? no  Does patient have a history of alcohol/drug use?  no  Is patient on Coumadin, Plavix and/or Aspirin? yes  Medications: see epic  Allergies: see epic  Medication Adjustment per Dr Laural Golden: asa 2 days  Procedure date & time: 01/22/17 at 1030

## 2016-12-23 NOTE — Telephone Encounter (Signed)
agree

## 2017-01-22 ENCOUNTER — Encounter (HOSPITAL_COMMUNITY): Payer: Self-pay | Admitting: *Deleted

## 2017-01-22 ENCOUNTER — Encounter (HOSPITAL_COMMUNITY)
Admission: RE | Disposition: A | Discharge status: Home / Self Care | Payer: Self-pay | Source: Ambulatory Visit | Attending: Internal Medicine

## 2017-01-22 ENCOUNTER — Ambulatory Visit (HOSPITAL_COMMUNITY)
Admission: RE | Admit: 2017-01-22 | Discharge: 2017-01-22 | Disposition: A | Discharge status: Home / Self Care | Payer: Medicare Other | Source: Ambulatory Visit | Attending: Internal Medicine | Admitting: Internal Medicine

## 2017-01-22 DIAGNOSIS — F17210 Nicotine dependence, cigarettes, uncomplicated: Secondary | ICD-10-CM | POA: Insufficient documentation

## 2017-01-22 DIAGNOSIS — Z7982 Long term (current) use of aspirin: Secondary | ICD-10-CM | POA: Diagnosis not present

## 2017-01-22 DIAGNOSIS — K5730 Diverticulosis of large intestine without perforation or abscess without bleeding: Secondary | ICD-10-CM | POA: Insufficient documentation

## 2017-01-22 DIAGNOSIS — Z880 Allergy status to penicillin: Secondary | ICD-10-CM | POA: Insufficient documentation

## 2017-01-22 DIAGNOSIS — Z09 Encounter for follow-up examination after completed treatment for conditions other than malignant neoplasm: Secondary | ICD-10-CM | POA: Diagnosis not present

## 2017-01-22 DIAGNOSIS — Z86010 Personal history of colonic polyps: Secondary | ICD-10-CM | POA: Insufficient documentation

## 2017-01-22 DIAGNOSIS — F419 Anxiety disorder, unspecified: Secondary | ICD-10-CM | POA: Diagnosis not present

## 2017-01-22 DIAGNOSIS — F329 Major depressive disorder, single episode, unspecified: Secondary | ICD-10-CM | POA: Insufficient documentation

## 2017-01-22 DIAGNOSIS — J449 Chronic obstructive pulmonary disease, unspecified: Secondary | ICD-10-CM | POA: Diagnosis not present

## 2017-01-22 DIAGNOSIS — Z1211 Encounter for screening for malignant neoplasm of colon: Secondary | ICD-10-CM | POA: Insufficient documentation

## 2017-01-22 DIAGNOSIS — I129 Hypertensive chronic kidney disease with stage 1 through stage 4 chronic kidney disease, or unspecified chronic kidney disease: Secondary | ICD-10-CM | POA: Insufficient documentation

## 2017-01-22 DIAGNOSIS — M1990 Unspecified osteoarthritis, unspecified site: Secondary | ICD-10-CM | POA: Diagnosis not present

## 2017-01-22 DIAGNOSIS — N189 Chronic kidney disease, unspecified: Secondary | ICD-10-CM | POA: Insufficient documentation

## 2017-01-22 DIAGNOSIS — Z79899 Other long term (current) drug therapy: Secondary | ICD-10-CM | POA: Diagnosis not present

## 2017-01-22 DIAGNOSIS — K644 Residual hemorrhoidal skin tags: Secondary | ICD-10-CM | POA: Insufficient documentation

## 2017-01-22 DIAGNOSIS — Z885 Allergy status to narcotic agent status: Secondary | ICD-10-CM | POA: Insufficient documentation

## 2017-01-22 DIAGNOSIS — K5520 Angiodysplasia of colon without hemorrhage: Secondary | ICD-10-CM | POA: Insufficient documentation

## 2017-01-22 HISTORY — DX: Polyp of colon: K63.5

## 2017-01-22 HISTORY — PX: COLONOSCOPY: SHX5424

## 2017-01-22 SURGERY — COLONOSCOPY
Anesthesia: Moderate Sedation

## 2017-01-22 MED ORDER — MEPERIDINE HCL 50 MG/ML IJ SOLN
INTRAMUSCULAR | Status: AC
  Filled 2017-01-22: qty 1

## 2017-01-22 MED ORDER — SODIUM CHLORIDE 0.9 % IV SOLN
INTRAVENOUS | Status: DC
  Administered 2017-01-22: 10:00:00 via INTRAVENOUS

## 2017-01-22 MED ORDER — MIDAZOLAM HCL 5 MG/5ML IJ SOLN
INTRAMUSCULAR | Status: DC | PRN
  Administered 2017-01-22: 2 mg via INTRAVENOUS
  Administered 2017-01-22 (×2): 1 mg via INTRAVENOUS

## 2017-01-22 MED ORDER — STERILE WATER FOR IRRIGATION IR SOLN
Status: DC | PRN
  Administered 2017-01-22: 10:00:00

## 2017-01-22 MED ORDER — MEPERIDINE HCL 50 MG/ML IJ SOLN
INTRAMUSCULAR | Status: DC | PRN
  Administered 2017-01-22 (×2): 25 mg via INTRAVENOUS

## 2017-01-22 MED ORDER — MIDAZOLAM HCL 5 MG/5ML IJ SOLN
INTRAMUSCULAR | Status: AC
  Filled 2017-01-22: qty 10

## 2017-01-22 NOTE — Discharge Instructions (Signed)
Resume usual medications including aspirin. High fiber diet. No driving for 24 hours. Next colonoscopy in 5 years.  Colonoscopy, Adult, Care After This sheet gives you information about how to care for yourself after your procedure. Your health care provider may also give you more specific instructions. If you have problems or questions, contact your health care provider. What can I expect after the procedure? After the procedure, it is common to have:  A small amount of blood in your stool for 24 hours after the procedure.  Some gas.  Mild abdominal cramping or bloating.  Follow these instructions at home: General instructions   For the first 24 hours after the procedure: ? Do not drive or use machinery. ? Do not sign important documents. ? Do not drink alcohol. ? Do your regular daily activities at a slower pace than normal. ? Eat soft, easy-to-digest foods. ? Rest often.  Take over-the-counter or prescription medicines only as told by your health care provider.  It is up to you to get the results of your procedure. Ask your health care provider, or the department performing the procedure, when your results will be ready. Relieving cramping and bloating  Try walking around when you have cramps or feel bloated.  Apply heat to your abdomen as told by your health care provider. Use a heat source that your health care provider recommends, such as a moist heat pack or a heating pad. ? Place a towel between your skin and the heat source. ? Leave the heat on for 20-30 minutes. ? Remove the heat if your skin turns bright red. This is especially important if you are unable to feel pain, heat, or cold. You may have a greater risk of getting burned. Eating and drinking  Drink enough fluid to keep your urine clear or pale yellow.  Resume your normal diet as instructed by your health care provider. Avoid heavy or fried foods that are hard to digest.  Avoid drinking alcohol for as  long as instructed by your health care provider. Contact a health care provider if:  You have blood in your stool 2-3 days after the procedure. Get help right away if:  You have more than a small spotting of blood in your stool.  You pass large blood clots in your stool.  Your abdomen is swollen.  You have nausea or vomiting.  You have a fever.  You have increasing abdominal pain that is not relieved with medicine. This information is not intended to replace advice given to you by your health care provider. Make sure you discuss any questions you have with your health care provider. Document Released: 12/25/2003 Document Revised: 02/04/2016 Document Reviewed: 07/24/2015 Elsevier Interactive Patient Education  2018 [employer].  High-Fiber Diet Fiber, also called dietary fiber, is a type of carbohydrate found in fruits, vegetables, whole grains, and beans. A high-fiber diet can have many health benefits. Your health care provider may recommend a high-fiber diet to help:  Prevent constipation. Fiber can make your bowel movements more regular.  Lower your cholesterol.  Relieve hemorrhoids, uncomplicated diverticulosis, or irritable bowel syndrome.  Prevent overeating as part of a weight-loss plan.  Prevent heart disease, type 2 diabetes, and certain cancers.  What is my plan? The recommended daily intake of fiber includes:  38 grams for men under age 11.  73 grams for men over age 52.  85 grams for women under age 78.  42 grams for women over age 18.  You can get  the recommended daily intake of dietary fiber by eating a variety of fruits, vegetables, grains, and beans. Your health care provider may also recommend a fiber supplement if it is not possible to get enough fiber through your diet. What do I need to know about a high-fiber diet?  Fiber supplements have not been widely studied for their effectiveness, so it is better to get fiber through food sources.  Always  check the fiber content on thenutrition facts label of any prepackaged food. Look for foods that contain at least 5 grams of fiber per serving.  Ask your dietitian if you have questions about specific foods that are related to your condition, especially if those foods are not listed in the following section.  Increase your daily fiber consumption gradually. Increasing your intake of dietary fiber too quickly may cause bloating, cramping, or gas.  Drink plenty of water. Water helps you to digest fiber. What foods can I eat? Grains Whole-grain breads. Multigrain cereal. Oats and oatmeal. Brown rice. Barley. Bulgur wheat. Sunrise. Bran muffins. Popcorn. Rye wafer crackers. Vegetables Sweet potatoes. Spinach. Kale. Artichokes. Cabbage. Broccoli. Green peas. Carrots. Squash. Fruits Berries. Pears. Apples. Oranges. Avocados. Prunes and raisins. Dried figs. Meats and Other Protein Sources Navy, kidney, pinto, and soy beans. Split peas. Lentils. Nuts and seeds. Dairy Fiber-fortified yogurt. Beverages Fiber-fortified soy milk. Fiber-fortified orange juice. Other Fiber bars. The items listed above may not be a complete list of recommended foods or beverages. Contact your dietitian for more options. What foods are not recommended? Grains White bread. Pasta made with refined flour. White rice. Vegetables Fried potatoes. Canned vegetables. Well-cooked vegetables. Fruits Fruit juice. Cooked, strained fruit. Meats and Other Protein Sources Fatty cuts of meat. Fried Sales executive or fried fish. Dairy Milk. Yogurt. Cream cheese. Sour cream. Beverages Soft drinks. Other Cakes and pastries. Butter and oils. The items listed above may not be a complete list of foods and beverages to avoid. Contact your dietitian for more information. What are some tips for including high-fiber foods in my diet?  Eat a wide variety of high-fiber foods.  Make sure that half of all grains consumed each day are whole  grains.  Replace breads and cereals made from refined flour or white flour with whole-grain breads and cereals.  Replace white rice with brown rice, bulgur wheat, or millet.  Start the day with a breakfast that is high in fiber, such as a cereal that contains at least 5 grams of fiber per serving.  Use beans in place of meat in soups, salads, or pasta.  Eat high-fiber snacks, such as berries, raw vegetables, nuts, or popcorn. This information is not intended to replace advice given to you by your health care provider. Make sure you discuss any questions you have with your health care provider. Document Released: 05/12/2005 Document Revised: 10/18/2015 Document Reviewed: 10/25/2013 Elsevier Interactive Patient Education  2017 [employer].

## 2017-01-22 NOTE — Op Note (Signed)
[facility] Patient Name: Sophia Coleman Procedure Date: 01/22/2017 10:10 AM MRN: [id number] Date of Birth: 04/22/1941 Attending MD: Hildred Laser , MD CSN: [id number] Age: 77 Admit Type: Outpatient Procedure:                Colonoscopy Indications:              High risk colon cancer surveillance: Personal                            history of colonic polyps Providers:                Hildred Laser, MD, Otis Peak B. Sharon Seller, RN, Rosina Lowenstein, RN Referring MD:             Redmond School, MD Medicines:                Meperidine 50 mg IV, Midazolam 4 mg IV Complications:            No immediate complications. Estimated Blood Loss:     Estimated blood loss: none. Procedure:                Pre-Anesthesia Assessment:                           - Prior to the procedure, a History and Physical                            was performed, and patient medications and                            allergies were reviewed. The patient's tolerance of                            previous anesthesia was also reviewed. The risks                            and benefits of the procedure and the sedation                            options and risks were discussed with the patient.                            All questions were answered, and informed consent                            was obtained. Prior Anticoagulants: The patient                            last took aspirin 3 days prior to the procedure.                            ASA Grade Assessment: II - A patient with mild  systemic disease. After reviewing the risks and                            benefits, the patient was deemed in satisfactory                            condition to undergo the procedure.                           After obtaining informed consent, the colonoscope                            was passed under direct vision. Throughout the                            procedure, the  patient's blood pressure, pulse, and                            oxygen saturations were monitored continuously. The                            [id number]-[id number]) scope was introduced through the                            anus and advanced to the the cecum, identified by                            appendiceal orifice and ileocecal valve. The                            colonoscopy was technically difficult and complex                            due to significant looping and a tortuous colon.                            Successful completion of the procedure was aided by                            increasing the dose of sedation medication,                            changing the patient to a supine position, using                            manual pressure and withdrawing and reinserting the                            scope. The patient tolerated the procedure well.                            The quality of the bowel preparation was good. The  ileocecal valve, appendiceal orifice, and rectum                            were photographed. Scope In: 10:24:35 AM Scope Out: 10:48:52 AM Scope Withdrawal Time: 0 hours 8 minutes 21 seconds  Total Procedure Duration: 0 hours 24 minutes 17 seconds  Findings:      The perianal and digital rectal examinations were normal.      Five small and medium-sized angiodysplastic lesions without bleeding       were found in the cecum.      A few small-mouthed diverticula were found in the sigmoid colon.      External hemorrhoids were found during retroflexion. The hemorrhoids       were small. Impression:               - Five non-bleeding colonic angiodysplastic lesions.                           - Diverticulosis in the sigmoid colon.                           - External hemorrhoids.                           - No specimens collected. Moderate Sedation:      Moderate (conscious) sedation was administered by the endoscopy nurse        and supervised by the endoscopist. The following parameters were       monitored: oxygen saturation, heart rate, blood pressure, CO2       capnography and response to care. Total physician intraservice time was       27 minutes. Recommendation:           - Patient has a contact number available for                            emergencies. The signs and symptoms of potential                            delayed complications were discussed with the                            patient. Return to normal activities tomorrow.                            Written discharge instructions were provided to the                            patient.                           - High fiber diet today.                           - Continue present medications.                           - Repeat colonoscopy in 5 years for surveillance. Procedure Code(s):        ---  Professional ---                           [phone], Colonoscopy, flexible; diagnostic, including                            collection of specimen(s) by brushing or washing,                            when performed (separate procedure)                           99152, Moderate sedation services provided by the                            same physician or other qualified health care                            professional performing the diagnostic or                            therapeutic service that the sedation supports,                            requiring the presence of an independent trained                            observer to assist in the monitoring of the                            patient's level of consciousness and physiological                            status; initial 15 minutes of intraservice time,                            patient age 74 years or older                           [phone], Moderate sedation services; each additional                            15 minutes intraservice time Diagnosis Code(s):        --- Professional ---                            Z86.010, Personal history of colonic polyps                           K55.20, Angiodysplasia of colon without hemorrhage                           K64.4, Residual hemorrhoidal skin tags                           K57.30, Diverticulosis of large  intestine without                            perforation or abscess without bleeding CPT copyright 2016 American Medical Association. All rights reserved. The codes documented in this report are preliminary and upon coder review may  be revised to meet current compliance requirements. Hildred Laser, MD Hildred Laser, MD 01/22/2017 10:56:18 AM This report has been signed electronically. Number of Addenda: 0

## 2017-01-22 NOTE — H&P (Signed)
Sophia Coleman is an 76 y.o. female.   Chief Complaint: Patient is here for colonoscopy. HPI: patient is 19-yed Caucasian female with history of colonic adenomas and is here for surveillance colonoscopy. She denies abdominal pain change in bowel habits or rectal bleeding. Last colonoscopy was in June 2013 with removal of 2 tubular adenomas. Family history is negative for CRC.  Past Medical History:  Diagnosis Date  . Anemia   . Anxiety   . Arthritis   . Carotid artery occlusion   . Chronic kidney disease   . Colon polyps   . COPD (chronic obstructive pulmonary disease) ([place])   . Depression   . Dyspnea   . Heart murmur   . Hypertension   . Pneumonia   . PONV (postoperative nausea and vomiting)     Past Surgical History:  Procedure Laterality Date  . CAROTID ENDARTERECTOMY    . CATARACT EXTRACTION W/PHACO Left 01/02/2014   Procedure: CATARACT EXTRACTION PHACO AND INTRAOCULAR LENS PLACEMENT ([facility]);  Surgeon: Tonny Branch, MD;  Location: [facility];  Service: Ophthalmology;  Laterality: Left;  CDE 8.17  . CATARACT EXTRACTION W/PHACO Right 03/06/2014   Procedure: CATARACT EXTRACTION PHACO AND INTRAOCULAR LENS PLACEMENT ([place]);  Surgeon: Tonny Branch, MD;  Location: [facility];  Service: Ophthalmology;  Laterality: Right;  CDE:15.14  . CHOLECYSTECTOMY    . COLONOSCOPY  11/13/2011   Procedure: COLONOSCOPY;  Surgeon: Rogene Houston, MD;  Location: [facility];  Service: Endoscopy;  Laterality: N/A;  1030  . ENDARTERECTOMY Left 04/02/2016   Procedure: ENDARTERECTOMY LEFT CAROTID ARTERY;  Surgeon: Rosetta Posner, MD;  Location: [facility];  Service: Vascular;  Laterality: Left;  . PATCH ANGIOPLASTY Left 04/02/2016   Procedure: PATCH ANGIOPLASTY LEFT CAROTID ARTERY USING HEMASHIELD PLATINUM FINESS PATCH;  Surgeon: Rosetta Posner, MD;  Location: [facility] OR;  Service: Vascular;  Laterality: Left;    Family History  Problem Relation Age of Onset  . Heart murmur Mother   . Heart Problems Mother   . Emphysema Father    . Cancer Sister   . Dementia Brother   . Colon cancer Neg Hx    Social History:  reports that she has been smoking Cigarettes.  She has a 20.00 pack-year smoking history. She has never used smokeless tobacco. She reports that she does not drink alcohol or use drugs.  Allergies:  Allergies  Allergen Reactions  . Augmentin [Amoxicillin-Pot Clavulanate] Other (See Comments)    "SEVERE SWEATS" Has patient had a PCN reaction causing immediate rash, facial/tongue/throat swelling, SOB or lightheadedness with hypotension: No Has patient had a PCN reaction causing severe rash involving mucus membranes or skin necrosis: No Has patient had a PCN reaction that required hospitalization: No Has patient had a PCN reaction occurring within the last 10 years: Yes If all of the above answers are "NO", then may proceed with Cephalosporin use.   . Vicodin [Hydrocodone-Acetaminophen] Nausea And Vomiting  . Quinolones Nausea And Vomiting    Medications Prior to Admission  Medication Sig Dispense Refill  . ibuprofen (ADVIL,MOTRIN) 200 MG tablet Take 200-400 mg by mouth daily as needed for headache or moderate pain. For pain    . losartan (COZAAR) 100 MG tablet Take 100 mg by mouth daily.     Marland Kitchen PARoxetine (PAXIL) 40 MG tablet Take 40 mg by mouth daily.   0  . aspirin 81 MG tablet Take 81 mg by mouth daily.      No results found for this or any previous  visit (from the past 48 hour(s)). No results found.  ROS  Blood pressure (!) 155/66, pulse 68, temperature (!) 97.5 F (36.4 C), temperature source Oral, resp. rate 15, height 5' 1.5" (1.562 m), weight 100 lb (45.4 kg), SpO2 98 %. Physical Exam  Constitutional:  Well-developed thin Caucasian female in NAD.  HENT:  Mouth/Throat: Oropharynx is clear and moist.  Eyes: Conjunctivae are normal. No scleral icterus.  Neck: No thyromegaly present.  Cardiovascular: Normal rate, regular rhythm and normal heart sounds.   No murmur heard. Respiratory:  Effort normal and breath sounds normal.  GI: Soft. She exhibits no distension and no mass. There is no tenderness.  Musculoskeletal: She exhibits no edema.  Lymphadenopathy:    She has no cervical adenopathy.  Neurological: She is alert.  Skin: Skin is warm and dry.     Assessment/Plan History of colonic adenomas. Surveillance colonoscopy.  Hildred Laser, MD 01/22/2017, 10:16 AM

## 2017-01-29 ENCOUNTER — Encounter (HOSPITAL_COMMUNITY): Payer: Self-pay | Admitting: Internal Medicine

## 2017-02-20 DIAGNOSIS — Z0001 Encounter for general adult medical examination with abnormal findings: Secondary | ICD-10-CM | POA: Diagnosis not present

## 2017-02-20 DIAGNOSIS — Z1389 Encounter for screening for other disorder: Secondary | ICD-10-CM | POA: Diagnosis not present

## 2017-02-20 DIAGNOSIS — Z681 Body mass index (BMI) 19 or less, adult: Secondary | ICD-10-CM | POA: Diagnosis not present

## 2017-02-20 DIAGNOSIS — J984 Other disorders of lung: Secondary | ICD-10-CM | POA: Diagnosis not present

## 2017-02-20 DIAGNOSIS — M5432 Sciatica, left side: Secondary | ICD-10-CM | POA: Diagnosis not present

## 2017-03-03 ENCOUNTER — Emergency Department (HOSPITAL_COMMUNITY)
Admission: EM | Admit: 2017-03-03 | Discharge: 2017-03-04 | Disposition: A | Discharge status: Home / Self Care | Payer: Medicare Other | Attending: Emergency Medicine | Admitting: Emergency Medicine

## 2017-03-03 DIAGNOSIS — N189 Chronic kidney disease, unspecified: Secondary | ICD-10-CM | POA: Diagnosis not present

## 2017-03-03 DIAGNOSIS — I2510 Atherosclerotic heart disease of native coronary artery without angina pectoris: Secondary | ICD-10-CM | POA: Insufficient documentation

## 2017-03-03 DIAGNOSIS — R42 Dizziness and giddiness: Secondary | ICD-10-CM | POA: Diagnosis not present

## 2017-03-03 DIAGNOSIS — R0989 Other specified symptoms and signs involving the circulatory and respiratory systems: Secondary | ICD-10-CM

## 2017-03-03 DIAGNOSIS — Z79899 Other long term (current) drug therapy: Secondary | ICD-10-CM | POA: Diagnosis not present

## 2017-03-03 DIAGNOSIS — J449 Chronic obstructive pulmonary disease, unspecified: Secondary | ICD-10-CM | POA: Insufficient documentation

## 2017-03-03 DIAGNOSIS — F17210 Nicotine dependence, cigarettes, uncomplicated: Secondary | ICD-10-CM | POA: Insufficient documentation

## 2017-03-03 DIAGNOSIS — Z7982 Long term (current) use of aspirin: Secondary | ICD-10-CM | POA: Diagnosis not present

## 2017-03-03 DIAGNOSIS — R51 Headache: Secondary | ICD-10-CM | POA: Diagnosis not present

## 2017-03-03 DIAGNOSIS — I129 Hypertensive chronic kidney disease with stage 1 through stage 4 chronic kidney disease, or unspecified chronic kidney disease: Secondary | ICD-10-CM | POA: Diagnosis not present

## 2017-03-03 LAB — CBC WITH DIFFERENTIAL/PLATELET
Basophils Absolute: 0.1 10*3/uL (ref 0.0–0.1)
Basophils Relative: 1 %
EOS ABS: 0.1 10*3/uL (ref 0.0–0.7)
EOS PCT: 1 %
HCT: 33.2 % — ABNORMAL LOW (ref 36.0–46.0)
HEMOGLOBIN: 10.2 g/dL — AB (ref 12.0–15.0)
LYMPHS ABS: 1.5 10*3/uL (ref 0.7–4.0)
Lymphocytes Relative: 20 %
MCH: 24.6 pg — AB (ref 26.0–34.0)
MCHC: 30.7 g/dL (ref 30.0–36.0)
MCV: 80 fL (ref 78.0–100.0)
MONOS PCT: 11 %
Monocytes Absolute: 0.9 10*3/uL (ref 0.1–1.0)
NEUTROS PCT: 67 %
Neutro Abs: 5.3 10*3/uL (ref 1.7–7.7)
Platelets: 371 10*3/uL (ref 150–400)
RBC: 4.15 MIL/uL (ref 3.87–5.11)
RDW: 17.3 % — ABNORMAL HIGH (ref 11.5–15.5)
WBC: 7.8 10*3/uL (ref 4.0–10.5)

## 2017-03-03 LAB — COMPREHENSIVE METABOLIC PANEL
ALK PHOS: 75 U/L (ref 38–126)
ALT: 17 U/L (ref 14–54)
ANION GAP: 8 (ref 5–15)
AST: 20 U/L (ref 15–41)
Albumin: 3.9 g/dL (ref 3.5–5.0)
BUN: 18 mg/dL (ref 6–20)
CALCIUM: 9.9 mg/dL (ref 8.9–10.3)
CO2: 25 mmol/L (ref 22–32)
Chloride: 105 mmol/L (ref 101–111)
Creatinine, Ser: 0.96 mg/dL (ref 0.44–1.00)
GFR calc non Af Amer: 57 mL/min — ABNORMAL LOW (ref >60)
Glucose, Bld: 95 mg/dL (ref 65–99)
Potassium: 4.2 mmol/L (ref 3.5–5.1)
SODIUM: 138 mmol/L (ref 135–145)
TOTAL PROTEIN: 7.5 g/dL (ref 6.5–8.1)
Total Bilirubin: 0.5 mg/dL (ref 0.3–1.2)

## 2017-03-03 LAB — TROPONIN I

## 2017-03-03 NOTE — Discharge Instructions (Signed)
Please share your results with your family doctor tomorrow in the clinic. Please drink plenty of fluids and rest today. Stop taking Prozac. Talk with your doctor tomorrow about alternatives.

## 2017-03-03 NOTE — ED Triage Notes (Signed)
Dizziness x 3 days, " feels like my head is spinning"  Started on Prozac on Friday.  Night sweat and nausea

## 2017-03-03 NOTE — ED Provider Notes (Signed)
[facility] DEPT Provider Note   CSN: [id number] Arrival date & time: 03/03/17  1348     History   Chief Complaint Chief Complaint  Patient presents with  . Dizziness    HPI Sophia Coleman is a 76 y.o. female.  HPI  The patient is a 76 year old female, she has a known history of carotid artery disease on the left status post carotid endarterectomy one year ago, history of COPD and a history of depression. She has been on long-term Paxil but recently felt like this was not giving her some much relief of her depression and was switched to Prozac. She has been on this for 3 days but states that after she takes it she becomes increasingly lightheaded. These symptoms do get better over the course of the day and when she wakes up the next morning she feels much better until she takes the medication again. She denies vertigo, headache, blurred vision or other focal neurologic symptoms. She has been having some hot sweats and feeling flushed but denies coughing, chest pain, shortness of breath, swelling of the legs, rashes or diarrhea. The symptoms are intermittent, they fluctuate throughout the day. She has never had these symptoms before.  Past Medical History:  Diagnosis Date  . Anemia   . Anxiety   . Arthritis   . Carotid artery occlusion   . Chronic kidney disease   . Colon polyps   . COPD (chronic obstructive pulmonary disease) ([place])   . Depression   . Dyspnea   . Heart murmur   . Hypertension   . Pneumonia   . PONV (postoperative nausea and vomiting)     Patient Active Problem List   Diagnosis Date Noted  . History of colonic polyps 09/18/2016  . Carotid stenosis 04/02/2016    Past Surgical History:  Procedure Laterality Date  . CAROTID ENDARTERECTOMY    . CATARACT EXTRACTION W/PHACO Left 01/02/2014   Procedure: CATARACT EXTRACTION PHACO AND INTRAOCULAR LENS PLACEMENT (IOC);  Surgeon: Tonny Branch, MD;  Location: [facility];  Service: Ophthalmology;  Laterality: Left;   CDE 8.17  . CATARACT EXTRACTION W/PHACO Right 03/06/2014   Procedure: CATARACT EXTRACTION PHACO AND INTRAOCULAR LENS PLACEMENT ([place]);  Surgeon: Tonny Branch, MD;  Location: [facility];  Service: Ophthalmology;  Laterality: Right;  CDE:15.14  . CHOLECYSTECTOMY    . COLONOSCOPY  11/13/2011   Procedure: COLONOSCOPY;  Surgeon: Rogene Houston, MD;  [facility];  Service: Endoscopy;  Laterality: N/A;  1030  . COLONOSCOPY N/A 01/22/2017   Procedure: COLONOSCOPY;  Surgeon: Rogene Houston, MD;  [facility];  Service: Endoscopy;  Laterality: N/A;  1030  . ENDARTERECTOMY Left 04/02/2016   Procedure: ENDARTERECTOMY LEFT CAROTID ARTERY;  Surgeon: Rosetta Posner, MD;  Location: [facility];  Service: Vascular;  Laterality: Left;  . PATCH ANGIOPLASTY Left 04/02/2016   Procedure: PATCH ANGIOPLASTY LEFT CAROTID ARTERY USING HEMASHIELD PLATINUM FINESS PATCH;  Surgeon: Rosetta Posner, MD;  Location: [facility];  Service: Vascular;  Laterality: Left;    OB History    No data available       Home Medications    Prior to Admission medications   Medication Sig Start Date End Date Taking? Authorizing Provider  aspirin 81 MG tablet Take 81 mg by mouth daily.   Yes [provider]  ibuprofen (ADVIL,MOTRIN) 200 MG tablet Take 200-400 mg by mouth daily as needed for headache or moderate pain. For pain   Yes [provider]  losartan (  COZAAR) 100 MG tablet Take 100 mg by mouth daily.    Yes [provider]    Family History Family History  Problem Relation Age of Onset  . Heart murmur Mother   . Heart Problems Mother   . Emphysema Father   . Cancer Sister   . Dementia Brother   . Colon cancer Neg Hx     Social History Social History  Substance Use Topics  . Smoking status: Current Every Day Smoker    Packs/day: 0.50    Years: 40.00    Types: Cigarettes  . Smokeless tobacco: Never Used  . Alcohol use No     Allergies   Augmentin [amoxicillin-pot clavulanate];  Vicodin [hydrocodone-acetaminophen]; and Quinolones   Review of Systems Review of Systems  All other systems reviewed and are negative.    Physical Exam Updated Vital Signs BP (!) 166/66   Pulse 71   Temp 98.3 F (36.8 C) (Oral)   Resp 17   Ht 5' 1.5" (1.562 m)   Wt 45.4 kg (100 lb)   SpO2 97%   BMI 18.59 kg/m   Physical Exam  Constitutional: She appears well-developed and well-nourished. No distress.  HENT:  Head: Normocephalic and atraumatic.  Mouth/Throat: Oropharynx is clear and moist. No oropharyngeal exudate.  Eyes: Pupils are equal, round, and reactive to light. Conjunctivae and EOM are normal. Right eye exhibits no discharge. Left eye exhibits no discharge. No scleral icterus.  Neck: Normal range of motion. Neck supple. No JVD present. No thyromegaly present.  Cardiovascular: Normal rate, regular rhythm, normal heart sounds and intact distal pulses.  Exam reveals no gallop and no friction rub.   No murmur heard. Carotid bruit present on the left  Pulmonary/Chest: Effort normal and breath sounds normal. No respiratory distress. She has no wheezes. She has no rales.  Abdominal: Soft. Bowel sounds are normal. She exhibits no distension and no mass. There is no tenderness.  Musculoskeletal: Normal range of motion. She exhibits no edema or tenderness.  Lymphadenopathy:    She has no cervical adenopathy.  Neurological: She is alert. Coordination normal.  Speech is clear, cranial nerves III through XII are intact, memory is intact, strength is normal in all 4 extremities including grips, sensation is intact to light touch and pinprick in all 4 extremities. Coordination as tested by finger-nose-finger is normal, no limb ataxia. Normal gait, normal reflexes at the patellar tendons bilaterally  Skin: Skin is warm and dry. No rash noted. No erythema.  Psychiatric: She has a normal mood and affect. Her behavior is normal.  Nursing note and vitals reviewed.    ED Treatments /  Results  Labs (all labs ordered are listed, but only abnormal results are displayed) Labs Reviewed  CBC WITH DIFFERENTIAL/PLATELET - Abnormal; Notable for the following:       Result Value   Hemoglobin 10.2 (*)    HCT 33.2 (*)    MCH 24.6 (*)    RDW 17.3 (*)    All other components within normal limits  COMPREHENSIVE METABOLIC PANEL - Abnormal; Notable for the following:    GFR calc non Af Amer 57 (*)    All other components within normal limits  TROPONIN I    EKG  EKG Interpretation  Date/Time:  Tuesday March 03 2017 14:19:11 EDT Ventricular Rate:  67 PR Interval:    QRS Duration: 85 QT Interval:  403 QTC Calculation: 426 R Axis:   88 Text Interpretation:  Sinus rhythm Borderline short PR interval  Borderline right axis deviation Anteroseptal infarct, old since last tracing no significant change Confirmed by Noemi Chapel [phone]) on 03/03/2017 3:58:27 PM       Radiology No results found.  Procedures Procedures (including critical care time)  Medications Ordered in ED Medications - No data to display   Initial Impression / Assessment and Plan / ED Course  I have reviewed the triage vital signs and the nursing notes.  Pertinent labs & imaging results that were available during my care of the patient were reviewed by me and considered in my medical decision making (see chart for details).     EKG is unremarkable, labs pending, the patient likely has a medication reaction to the new change in her antidepressant. She also has a carotid bruit, I have informed the patient of this finding and the need for a rapid ultrasound follow-up, I will also speak with her family doctor if possible. They have been paged  Discussed with Dr. Gerarda Fraction, he agrees the patient can be seen tomorrow morning as a walk-in, he will follow-up on the carotid bruit. Patient informed, labs unremarkable (chronic anemia), stable for discharge  Final Clinical Impressions(s) / ED Diagnoses   Final  diagnoses:  Light headed  Left carotid bruit    New Prescriptions Current Discharge Medication List       Noemi Chapel, MD 03/03/17 1649

## 2017-03-04 DIAGNOSIS — R51 Headache: Secondary | ICD-10-CM | POA: Diagnosis not present

## 2017-03-04 DIAGNOSIS — R5383 Other fatigue: Secondary | ICD-10-CM | POA: Diagnosis not present

## 2017-03-04 DIAGNOSIS — Z681 Body mass index (BMI) 19 or less, adult: Secondary | ICD-10-CM | POA: Diagnosis not present

## 2017-03-04 DIAGNOSIS — R42 Dizziness and giddiness: Secondary | ICD-10-CM | POA: Diagnosis not present

## 2017-03-04 DIAGNOSIS — D649 Anemia, unspecified: Secondary | ICD-10-CM | POA: Diagnosis not present

## 2017-03-05 ENCOUNTER — Emergency Department (HOSPITAL_COMMUNITY)
Admission: EM | Admit: 2017-03-05 | Discharge: 2017-03-05 | Disposition: A | Discharge status: Home / Self Care | Payer: Medicare Other | Attending: Emergency Medicine | Admitting: Emergency Medicine

## 2017-03-05 ENCOUNTER — Emergency Department (HOSPITAL_COMMUNITY): Payer: Medicare Other

## 2017-03-05 ENCOUNTER — Encounter (HOSPITAL_COMMUNITY): Payer: Self-pay | Admitting: Emergency Medicine

## 2017-03-05 DIAGNOSIS — R410 Disorientation, unspecified: Secondary | ICD-10-CM | POA: Diagnosis not present

## 2017-03-05 DIAGNOSIS — R200 Anesthesia of skin: Secondary | ICD-10-CM | POA: Insufficient documentation

## 2017-03-05 DIAGNOSIS — R531 Weakness: Secondary | ICD-10-CM | POA: Diagnosis present

## 2017-03-05 DIAGNOSIS — I2510 Atherosclerotic heart disease of native coronary artery without angina pectoris: Secondary | ICD-10-CM | POA: Diagnosis not present

## 2017-03-05 DIAGNOSIS — N189 Chronic kidney disease, unspecified: Secondary | ICD-10-CM | POA: Insufficient documentation

## 2017-03-05 DIAGNOSIS — J449 Chronic obstructive pulmonary disease, unspecified: Secondary | ICD-10-CM | POA: Insufficient documentation

## 2017-03-05 DIAGNOSIS — Z7982 Long term (current) use of aspirin: Secondary | ICD-10-CM | POA: Diagnosis not present

## 2017-03-05 DIAGNOSIS — Z79899 Other long term (current) drug therapy: Secondary | ICD-10-CM | POA: Diagnosis not present

## 2017-03-05 DIAGNOSIS — I509 Heart failure, unspecified: Secondary | ICD-10-CM | POA: Insufficient documentation

## 2017-03-05 DIAGNOSIS — R42 Dizziness and giddiness: Secondary | ICD-10-CM | POA: Diagnosis not present

## 2017-03-05 DIAGNOSIS — F17210 Nicotine dependence, cigarettes, uncomplicated: Secondary | ICD-10-CM | POA: Diagnosis not present

## 2017-03-05 DIAGNOSIS — R202 Paresthesia of skin: Secondary | ICD-10-CM | POA: Insufficient documentation

## 2017-03-05 DIAGNOSIS — I130 Hypertensive heart and chronic kidney disease with heart failure and stage 1 through stage 4 chronic kidney disease, or unspecified chronic kidney disease: Secondary | ICD-10-CM | POA: Insufficient documentation

## 2017-03-05 LAB — DIFFERENTIAL
BASOS ABS: 0.1 10*3/uL (ref 0.0–0.1)
BASOS PCT: 1 %
EOS ABS: 0.1 10*3/uL (ref 0.0–0.7)
EOS PCT: 1 %
LYMPHS ABS: 1.7 10*3/uL (ref 0.7–4.0)
Lymphocytes Relative: 23 %
MONOS PCT: 12 %
Monocytes Absolute: 0.9 10*3/uL (ref 0.1–1.0)
NEUTROS PCT: 63 %
Neutro Abs: 4.6 10*3/uL (ref 1.7–7.7)

## 2017-03-05 LAB — URINALYSIS, ROUTINE W REFLEX MICROSCOPIC
Bilirubin Urine: NEGATIVE
Glucose, UA: NEGATIVE mg/dL
KETONES UR: 5 mg/dL — AB
Nitrite: NEGATIVE
PROTEIN: NEGATIVE mg/dL
Specific Gravity, Urine: 1.019 (ref 1.005–1.030)
pH: 5 (ref 5.0–8.0)

## 2017-03-05 LAB — CBC
HCT: 33.9 % — ABNORMAL LOW (ref 36.0–46.0)
Hemoglobin: 10.7 g/dL — ABNORMAL LOW (ref 12.0–15.0)
MCH: 25.1 pg — AB (ref 26.0–34.0)
MCHC: 31.6 g/dL (ref 30.0–36.0)
MCV: 79.6 fL (ref 78.0–100.0)
Platelets: 374 10*3/uL (ref 150–400)
RBC: 4.26 MIL/uL (ref 3.87–5.11)
RDW: 17.2 % — ABNORMAL HIGH (ref 11.5–15.5)
WBC: 7.2 10*3/uL (ref 4.0–10.5)

## 2017-03-05 LAB — RAPID URINE DRUG SCREEN, HOSP PERFORMED
Amphetamines: NOT DETECTED
BARBITURATES: NOT DETECTED
BENZODIAZEPINES: NOT DETECTED
COCAINE: NOT DETECTED
OPIATES: NOT DETECTED
Tetrahydrocannabinol: NOT DETECTED

## 2017-03-05 LAB — I-STAT CHEM 8, ED
BUN: 18 mg/dL (ref 6–20)
Calcium, Ion: 1.18 mmol/L (ref 1.15–1.40)
Chloride: 110 mmol/L (ref 101–111)
Creatinine, Ser: 0.8 mg/dL (ref 0.44–1.00)
Glucose, Bld: 92 mg/dL (ref 65–99)
HEMATOCRIT: 33 % — AB (ref 36.0–46.0)
HEMOGLOBIN: 11.2 g/dL — AB (ref 12.0–15.0)
POTASSIUM: 4.1 mmol/L (ref 3.5–5.1)
SODIUM: 138 mmol/L (ref 135–145)
TCO2: 22 mmol/L (ref 22–32)

## 2017-03-05 LAB — COMPREHENSIVE METABOLIC PANEL
ALBUMIN: 3.9 g/dL (ref 3.5–5.0)
ALT: 17 U/L (ref 14–54)
ANION GAP: 10 (ref 5–15)
AST: 26 U/L (ref 15–41)
Alkaline Phosphatase: 73 U/L (ref 38–126)
BILIRUBIN TOTAL: 0.6 mg/dL (ref 0.3–1.2)
BUN: 19 mg/dL (ref 6–20)
CHLORIDE: 105 mmol/L (ref 101–111)
CO2: 21 mmol/L — ABNORMAL LOW (ref 22–32)
Calcium: 10 mg/dL (ref 8.9–10.3)
Creatinine, Ser: 0.9 mg/dL (ref 0.44–1.00)
GFR calc Af Amer: >60 mL/min (ref >60)
Glucose, Bld: 93 mg/dL (ref 65–99)
POTASSIUM: 4 mmol/L (ref 3.5–5.1)
Sodium: 136 mmol/L (ref 135–145)
TOTAL PROTEIN: 7.5 g/dL (ref 6.5–8.1)

## 2017-03-05 LAB — I-STAT TROPONIN, ED: TROPONIN I, POC: 0 ng/mL (ref 0.00–0.08)

## 2017-03-05 LAB — PROTIME-INR
INR: 0.93
Prothrombin Time: 12.4 seconds (ref 11.4–15.2)

## 2017-03-05 LAB — APTT: APTT: 27 s (ref 24–36)

## 2017-03-05 LAB — ETHANOL

## 2017-03-05 MED ORDER — GADOBENATE DIMEGLUMINE 529 MG/ML IV SOLN
9.0000 mL | Freq: Once | INTRAVENOUS | Status: AC | PRN
  Administered 2017-03-05: 9 mL via INTRAVENOUS

## 2017-03-05 MED ORDER — LORAZEPAM 2 MG/ML IJ SOLN
1.0000 mg | Freq: Once | INTRAMUSCULAR | Status: AC
  Administered 2017-03-05: 1 mg via INTRAVENOUS
  Filled 2017-03-05: qty 1

## 2017-03-05 NOTE — ED Notes (Signed)
ED Provider at bedside. 

## 2017-03-05 NOTE — ED Provider Notes (Addendum)
[facility] DEPT Provider Note   CSN: [id number] Arrival date & time: 03/05/17  1226     History   Chief Complaint Chief Complaint  Patient presents with  . Weakness  . Numbness    HPI Sophia Coleman is a 76 y.o. female.  HPI Patient presents the emergency department with approximately 3-4 days of generalized weakness as well as numbness of the top of her head and numbness of her left side.  Family reports some mild confusion.  No vomiting or diarrhea.  No urinary symptoms.  Patient was recently taken off of her long term medication Paxil and switch to a different antidepressant.  She feels like these symptoms may be related to that.  She was seen in the ER 3 days ago for similar symptomsand was told follow-up with her primary care physician.  She continues to feel abnormal and thus came to the ER for further evaluation.  She's concerned given her history of endarterectomy about the possibility of a stroke.  This is a concern of both hers and the family's.  She denies focused weakness of her arms or legs.  No gait abnormalities.  She states that she just feels "off" Past Medical History:  Diagnosis Date  . Anemia   . Anxiety   . Arthritis   . Carotid artery occlusion   . Chronic kidney disease   . Colon polyps   . COPD (chronic obstructive pulmonary disease) ([place])   . Depression   . Dyspnea   . Heart murmur   . Hypertension   . Pneumonia   . PONV (postoperative nausea and vomiting)     Patient Active Problem List   Diagnosis Date Noted  . History of colonic polyps 09/18/2016  . Carotid stenosis 04/02/2016    Past Surgical History:  Procedure Laterality Date  . CAROTID ENDARTERECTOMY    . CATARACT EXTRACTION W/PHACO Left 01/02/2014   Procedure: CATARACT EXTRACTION PHACO AND INTRAOCULAR LENS PLACEMENT (IOC);  Surgeon: Tonny Branch, MD;  [facility];  Service: Ophthalmology;  Laterality: Left;  CDE 8.17  . CATARACT EXTRACTION W/PHACO Right 03/06/2014   Procedure:  CATARACT EXTRACTION PHACO AND INTRAOCULAR LENS PLACEMENT ([place]);  Surgeon: Tonny Branch, MD;  Location[facility];  Service: Ophthalmology;  Laterality: Right;  CDE:15.14  . CHOLECYSTECTOMY    . COLONOSCOPY  11/13/2011   Procedure: COLONOSCOPY;  Surgeon: Rogene Houston, MD;  [facility];  Service: Endoscopy;  Laterality: N/A;  1030  . COLONOSCOPY N/A 01/22/2017   Procedure: COLONOSCOPY;  Surgeon: Rogene Houston, MD;  [facility];  Service: Endoscopy;  Laterality: N/A;  1030  . ENDARTERECTOMY Left 04/02/2016   Procedure: ENDARTERECTOMY LEFT CAROTID ARTERY;  Surgeon: Rosetta Posner, MD;  Location: [facility];  Service: Vascular;  Laterality: Left;  . PATCH ANGIOPLASTY Left 04/02/2016   Procedure: PATCH ANGIOPLASTY LEFT CAROTID ARTERY USING HEMASHIELD PLATINUM FINESS PATCH;  Surgeon: Rosetta Posner, MD;  Location: [facility];  Service: Vascular;  Laterality: Left;    OB History    No data available       Home Medications    Prior to Admission medications   Medication Sig Start Date End Date Taking? Authorizing Provider  aspirin 81 MG tablet Take 81 mg by mouth daily.   Yes [provider]  ibuprofen (ADVIL,MOTRIN) 200 MG tablet Take 200-400 mg by mouth daily as needed for headache or moderate pain. For pain   Yes [provider]  losartan (COZAAR) 100 MG  tablet Take 100 mg by mouth daily.    Yes [provider]  PARoxetine (PAXIL) 40 MG tablet Take 40 mg by mouth every morning.   Yes [provider]    Family History Family History  Problem Relation Age of Onset  . Heart murmur Mother   . Heart Problems Mother   . Emphysema Father   . Cancer Sister   . Dementia Brother   . Colon cancer Neg Hx     Social History Social History  Substance Use Topics  . Smoking status: Current Every Day Smoker    Packs/day: 0.50    Years: 40.00    Types: Cigarettes  . Smokeless tobacco: Never Used  . Alcohol use No     Allergies   Augmentin  [amoxicillin-pot clavulanate]; Vicodin [hydrocodone-acetaminophen]; and Quinolones   Review of Systems Review of Systems  All other systems reviewed and are negative.    Physical Exam Updated Vital Signs BP (!) 144/64   Pulse 65   Temp 98.1 F (36.7 C)   Resp 14   Ht 5\' 1"  (1.549 m)   Wt 45.4 kg (100 lb)   SpO2 96%   BMI 18.89 kg/m   Physical Exam  Constitutional: She is oriented to person, place, and time. She appears well-developed and well-nourished. No distress.  HENT:  Head: Normocephalic and atraumatic.  Eyes: Pupils are equal, round, and reactive to light. EOM are normal.  Neck: Normal range of motion.  Cardiovascular: Normal rate, regular rhythm and normal heart sounds.   Pulmonary/Chest: Effort normal and breath sounds normal.  Abdominal: Soft. She exhibits no distension. There is no tenderness.  Musculoskeletal: Normal range of motion.  Neurological: She is alert and oriented to person, place, and time.  5/5 strength in major muscle groups of  bilateral upper and lower extremities. Speech normal. No facial asymetry.   Skin: Skin is warm and dry.  Psychiatric: She has a normal mood and affect. Judgment normal.  Nursing note and vitals reviewed.    ED Treatments / Results  Labs (all labs ordered are listed, but only abnormal results are displayed) Labs Reviewed  CBC - Abnormal; Notable for the following:       Result Value   Hemoglobin 10.7 (*)    HCT 33.9 (*)    MCH 25.1 (*)    RDW 17.2 (*)    All other components within normal limits  COMPREHENSIVE METABOLIC PANEL - Abnormal; Notable for the following:    CO2 21 (*)    All other components within normal limits  URINALYSIS, ROUTINE W REFLEX MICROSCOPIC - Abnormal; Notable for the following:    Hgb urine dipstick SMALL (*)    Ketones, ur 5 (*)    Leukocytes, UA TRACE (*)    Bacteria, UA RARE (*)    Squamous Epithelial / LPF 0-5 (*)    All other components within normal limits  I-STAT CHEM 8, ED -  Abnormal; Notable for the following:    Hemoglobin 11.2 (*)    HCT 33.0 (*)    All other components within normal limits  ETHANOL  PROTIME-INR  APTT  DIFFERENTIAL  RAPID URINE DRUG SCREEN, HOSP PERFORMED  I-STAT TROPONIN, ED    EKG  EKG Interpretation  Date/Time:  Thursday March 05 2017 12:58:21 EDT Ventricular Rate:  77 PR Interval:    QRS Duration: 76 QT Interval:  193 QTC Calculation: 211 R Axis:   92 Text Interpretation:  Sinus rhythm Atrial premature complex Anterior infarct,  old Lead(s) I were not used for morphology analysis No significant change was found Confirmed by Jola Schmidt [phone]) on 03/05/2017 6:21:39 PM       Radiology Mr Angiogram Head Wo Contrast  Result Date: 03/05/2017 CLINICAL DATA:  Dizziness for 3 days. History of left carotid surgery. EXAM: MR HEAD WITHOUT CONTRAST MR CIRCLE OF WILLIS WITHOUT CONTRAST MRA OF THE NECK WITHOUT AND WITH CONTRAST TECHNIQUE: Multiplanar, multiecho pulse sequences of the brain, circle of willis and surrounding structures were obtained without intravenous contrast. Angiographic images of the neck were obtained using MRA technique without and with intravenous contrast. CONTRAST:  53mL MULTIHANCE GADOBENATE DIMEGLUMINE 529 MG/ML IV SOLN COMPARISON:  None. FINDINGS: MR HEAD FINDINGS Brain: No acute infarction, hemorrhage, hydrocephalus, extra-axial collection or mass lesion. T2 hyperintensity in the pons is patchy/ill-defined and bilateral. Suspect this is chronic microvascular ischemic change, patient has multiple vascular risk factors. No expansion or masslike features. No signal abnormality in the deep gray nuclei. Mild chronic microvascular ischemia in the periventricular white matter. Vascular: Arterial findings below. Normal dural venous sinus flow voids. Skull and upper cervical spine: No evidence of marrow lesion. Sinuses/Orbits: Clear paranasal sinuses. Bilateral cataract resection. MR CIRCLE OF WILLIS FINDINGS Limited by  motion artifact which most significantly affects the carotid siphons and proximal A2/M2 branches. No suspected flow limiting stenosis or branch occlusion. No identified aneurysm. Mild right vertebral artery dominance.  Hypoplastic left P1 segment. MRA NECK FINDINGS Severely motion degraded, postcontrast imaging is nondiagnostic. On time-of-flight imaging there is antegrade flow in both carotid and vertebral arteries. The time-of-flight sequence begins at the level of the distal common carotid arteries and continues to the skullbase. There is atheromatous changes about the left more than right carotid bifurcation without suspected flow limiting stenosis. IMPRESSION: Brain MRI: 1. No acute finding. 2. Signal abnormality in the pons and periventricular white matter that is usually from chronic small vessel ischemia. 3. Moderate intermittent motion degradation. Intracranial MRA: Moderately degraded by patient motion. No acute finding or flow limiting stenosis. Neck MRA: Nondiagnostic postcontrast MRA and significantly degraded time-of-flight MRA. Antegrade flow in both carotid and vertebral arteries without suspected flow limiting stenosis. Electronically Signed   By: Monte Fantasia M.D.   On: 03/05/2017 14:18   Mr Angiogram Neck W Or Wo Contrast  Result Date: 03/05/2017 CLINICAL DATA:  Dizziness for 3 days. History of left carotid surgery. EXAM: MR HEAD WITHOUT CONTRAST MR CIRCLE OF WILLIS WITHOUT CONTRAST MRA OF THE NECK WITHOUT AND WITH CONTRAST TECHNIQUE: Multiplanar, multiecho pulse sequences of the brain, circle of willis and surrounding structures were obtained without intravenous contrast. Angiographic images of the neck were obtained using MRA technique without and with intravenous contrast. CONTRAST:  37mL MULTIHANCE GADOBENATE DIMEGLUMINE 529 MG/ML IV SOLN COMPARISON:  None. FINDINGS: MR HEAD FINDINGS Brain: No acute infarction, hemorrhage, hydrocephalus, extra-axial collection or mass lesion. T2  hyperintensity in the pons is patchy/ill-defined and bilateral. Suspect this is chronic microvascular ischemic change, patient has multiple vascular risk factors. No expansion or masslike features. No signal abnormality in the deep gray nuclei. Mild chronic microvascular ischemia in the periventricular white matter. Vascular: Arterial findings below. Normal dural venous sinus flow voids. Skull and upper cervical spine: No evidence of marrow lesion. Sinuses/Orbits: Clear paranasal sinuses. Bilateral cataract resection. MR CIRCLE OF WILLIS FINDINGS Limited by motion artifact which most significantly affects the carotid siphons and proximal A2/M2 branches. No suspected flow limiting stenosis or branch occlusion. No identified aneurysm. Mild right vertebral artery dominance.  Hypoplastic  left P1 segment. MRA NECK FINDINGS Severely motion degraded, postcontrast imaging is nondiagnostic. On time-of-flight imaging there is antegrade flow in both carotid and vertebral arteries. The time-of-flight sequence begins at the level of the distal common carotid arteries and continues to the skullbase. There is atheromatous changes about the left more than right carotid bifurcation without suspected flow limiting stenosis. IMPRESSION: Brain MRI: 1. No acute finding. 2. Signal abnormality in the pons and periventricular white matter that is usually from chronic small vessel ischemia. 3. Moderate intermittent motion degradation. Intracranial MRA: Moderately degraded by patient motion. No acute finding or flow limiting stenosis. Neck MRA: Nondiagnostic postcontrast MRA and significantly degraded time-of-flight MRA. Antegrade flow in both carotid and vertebral arteries without suspected flow limiting stenosis. Electronically Signed   By: Monte Fantasia M.D.   On: 03/05/2017 14:18   Mr Brain Wo Contrast  Result Date: 03/05/2017 CLINICAL DATA:  Dizziness for 3 days. History of left carotid surgery. EXAM: MR HEAD WITHOUT CONTRAST MR  CIRCLE OF WILLIS WITHOUT CONTRAST MRA OF THE NECK WITHOUT AND WITH CONTRAST TECHNIQUE: Multiplanar, multiecho pulse sequences of the brain, circle of willis and surrounding structures were obtained without intravenous contrast. Angiographic images of the neck were obtained using MRA technique without and with intravenous contrast. CONTRAST:  40mL MULTIHANCE GADOBENATE DIMEGLUMINE 529 MG/ML IV SOLN COMPARISON:  None. FINDINGS: MR HEAD FINDINGS Brain: No acute infarction, hemorrhage, hydrocephalus, extra-axial collection or mass lesion. T2 hyperintensity in the pons is patchy/ill-defined and bilateral. Suspect this is chronic microvascular ischemic change, patient has multiple vascular risk factors. No expansion or masslike features. No signal abnormality in the deep gray nuclei. Mild chronic microvascular ischemia in the periventricular white matter. Vascular: Arterial findings below. Normal dural venous sinus flow voids. Skull and upper cervical spine: No evidence of marrow lesion. Sinuses/Orbits: Clear paranasal sinuses. Bilateral cataract resection. MR CIRCLE OF WILLIS FINDINGS Limited by motion artifact which most significantly affects the carotid siphons and proximal A2/M2 branches. No suspected flow limiting stenosis or branch occlusion. No identified aneurysm. Mild right vertebral artery dominance.  Hypoplastic left P1 segment. MRA NECK FINDINGS Severely motion degraded, postcontrast imaging is nondiagnostic. On time-of-flight imaging there is antegrade flow in both carotid and vertebral arteries. The time-of-flight sequence begins at the level of the distal common carotid arteries and continues to the skullbase. There is atheromatous changes about the left more than right carotid bifurcation without suspected flow limiting stenosis. IMPRESSION: Brain MRI: 1. No acute finding. 2. Signal abnormality in the pons and periventricular white matter that is usually from chronic small vessel ischemia. 3. Moderate  intermittent motion degradation. Intracranial MRA: Moderately degraded by patient motion. No acute finding or flow limiting stenosis. Neck MRA: Nondiagnostic postcontrast MRA and significantly degraded time-of-flight MRA. Antegrade flow in both carotid and vertebral arteries without suspected flow limiting stenosis. Electronically Signed   By: Monte Fantasia M.D.   On: 03/05/2017 14:18    Procedures Procedures (including critical care time)  Medications Ordered in ED Medications  LORazepam (ATIVAN) injection 1 mg (1 mg Intravenous Given 03/05/17 1306)  gadobenate dimeglumine (MULTIHANCE) injection 9 mL (9 mLs Intravenous Contrast Given 03/05/17 1356)     Initial Impression / Assessment and Plan / ED Course  I have reviewed the triage vital signs and the nursing notes.  Pertinent labs & imaging results that were available during my care of the patient were reviewed by me and considered in my medical decision making (see chart for details).     MRI brain  with MRA brain and neck demonstrate no significant abnormality.  No stroke.  Outpatient primary care follow-up.  Ambulatory in the ER.  Discharge home in good condition.  Some of this may be secondary to coming off of her Paxil  Final Clinical Impressions(s) / ED Diagnoses   Final diagnoses:  Numbness and tingling sensation of skin  Confusion    New Prescriptions Discharge Medication List as of 03/05/2017  2:42 PM       Jola Schmidt, MD 03/05/17 Tresa Moore    Jola Schmidt, MD 03/05/17 Vernelle Emerald

## 2017-03-05 NOTE — ED Triage Notes (Signed)
Patient comes in complaining of weakness and dizziness. States numbness at top of head. Sates was seen for same last Monday by Dr. Sabra Heck at this ED. Patient states its hard for her to recall things. She says this has been going on since Sunday. States it makes her feel like she has to cry all the time.

## 2017-03-05 NOTE — ED Notes (Signed)
Patient to MRI.

## 2017-03-17 DIAGNOSIS — D649 Anemia, unspecified: Secondary | ICD-10-CM | POA: Diagnosis not present

## 2017-03-17 DIAGNOSIS — E748 Other specified disorders of carbohydrate metabolism: Secondary | ICD-10-CM | POA: Diagnosis not present

## 2017-03-24 DIAGNOSIS — N390 Urinary tract infection, site not specified: Secondary | ICD-10-CM | POA: Diagnosis not present

## 2017-03-30 ENCOUNTER — Other Ambulatory Visit (HOSPITAL_COMMUNITY): Payer: Self-pay | Admitting: Internal Medicine

## 2017-03-30 DIAGNOSIS — Z1231 Encounter for screening mammogram for malignant neoplasm of breast: Secondary | ICD-10-CM

## 2017-04-06 DIAGNOSIS — J984 Other disorders of lung: Secondary | ICD-10-CM | POA: Diagnosis not present

## 2017-04-06 DIAGNOSIS — J209 Acute bronchitis, unspecified: Secondary | ICD-10-CM | POA: Diagnosis not present

## 2017-04-06 DIAGNOSIS — Z681 Body mass index (BMI) 19 or less, adult: Secondary | ICD-10-CM | POA: Diagnosis not present

## 2017-04-10 DIAGNOSIS — J329 Chronic sinusitis, unspecified: Secondary | ICD-10-CM | POA: Diagnosis not present

## 2017-04-10 DIAGNOSIS — F17290 Nicotine dependence, other tobacco product, uncomplicated: Secondary | ICD-10-CM | POA: Diagnosis not present

## 2017-04-10 DIAGNOSIS — I10 Essential (primary) hypertension: Secondary | ICD-10-CM | POA: Diagnosis not present

## 2017-04-10 DIAGNOSIS — Z681 Body mass index (BMI) 19 or less, adult: Secondary | ICD-10-CM | POA: Diagnosis not present

## 2017-04-14 ENCOUNTER — Other Ambulatory Visit (HOSPITAL_COMMUNITY): Payer: Self-pay | Admitting: Internal Medicine

## 2017-04-14 DIAGNOSIS — J984 Other disorders of lung: Secondary | ICD-10-CM

## 2017-04-22 ENCOUNTER — Other Ambulatory Visit: Payer: Self-pay

## 2017-04-22 ENCOUNTER — Encounter (HOSPITAL_COMMUNITY): Payer: Self-pay | Admitting: Oncology

## 2017-04-22 ENCOUNTER — Encounter (HOSPITAL_COMMUNITY): Payer: Medicare Other | Attending: Oncology | Admitting: Oncology

## 2017-04-22 VITALS — BP 161/68 | HR 71 | Temp 98.0°F | Resp 16 | Ht 62.0 in | Wt 96.5 lb

## 2017-04-22 DIAGNOSIS — D500 Iron deficiency anemia secondary to blood loss (chronic): Secondary | ICD-10-CM

## 2017-04-22 DIAGNOSIS — D509 Iron deficiency anemia, unspecified: Secondary | ICD-10-CM

## 2017-04-22 NOTE — Progress Notes (Signed)
[facility] Initial Visit:  Patient Care Team: Redmond School, MD as PCP - General (Internal Medicine)  CHIEF COMPLAINTS/PURPOSE OF CONSULTATION:  Iron deficiency anemia  HISTORY OF PRESENTING ILLNESS: Sophia Coleman 76 y.o. female presents for evaluation of iron deficiency anemia.  Patient has been feeling very fatigued for the past few months.  She had blood work performed on 03/04/2017 which demonstrated WBC 8.3K, hemoglobin 10.5 g/dL, hematocrit 32.3%, MCV 76, platelet count 419K, ferritin 8, TIBC 527, serum iron 18, iron saturation 3%, vitamin B12 455, folate greater than 20.  Previous CBC from 9.918 demonstrated hemoglobin 10.5 g/dL, hematocrit 33.9%, MCV 79.  Previous CBC from Tuesday 1518 demonstrated hemoglobin 9.5 g/dL, hematocrit 29.6%, MCV 74.  She had a colonoscopy on 01/22/17 which demonstrated five non-bleeding colonic angiodysplastic lesions, diverticulosis in the sigmoid colon, external hemorrhoids. She states that she has been feeling fatigued with some mild generalized weakness, but denies any palpitations, lightheadedness, chest pain, shortness of breath.  She denies noting any evidence of bleeding including melena, hematochezia, hematuria, hemoptysis. She has been on an oral iron supplement daily for the past few months with no improvement in her anemia and it causes her to have looser stools.  Review of Systems - Oncology ROS as per HPI otherwise 12 point ROS is negative.  MEDICAL HISTORY: Past Medical History:  Diagnosis Date  . Anemia   . Anxiety   . Arthritis   . Carotid artery occlusion   . Chronic kidney disease   . Colon polyps   . COPD (chronic obstructive pulmonary disease) ([place])   . Depression   . Dyspnea   . Heart murmur   . Hypertension   . Pneumonia   . PONV (postoperative nausea and vomiting)     SURGICAL HISTORY: Past Surgical History:  Procedure Laterality Date  . CAROTID ENDARTERECTOMY    . CATARACT EXTRACTION W/PHACO  Left 01/02/2014   Procedure: CATARACT EXTRACTION PHACO AND INTRAOCULAR LENS PLACEMENT (IOC);  Surgeon: Tonny Branch, MD;  Location: [facility];  Service: Ophthalmology;  Laterality: Left;  CDE 8.17  . CATARACT EXTRACTION W/PHACO Right 03/06/2014   Procedure: CATARACT EXTRACTION PHACO AND INTRAOCULAR LENS PLACEMENT ([place]);  Surgeon: Tonny Branch, MD;  Location: [facility];  Service: Ophthalmology;  Laterality: Right;  CDE:15.14  . CHOLECYSTECTOMY    . COLONOSCOPY  11/13/2011   Procedure: COLONOSCOPY;  Surgeon: Rogene Houston, MD;  [facility];  Service: Endoscopy;  Laterality: N/A;  1030  . COLONOSCOPY N/A 01/22/2017   Procedure: COLONOSCOPY;  Surgeon: Rogene Houston, MD;  [facility];  Service: Endoscopy;  Laterality: N/A;  1030  . ENDARTERECTOMY Left 04/02/2016   Procedure: ENDARTERECTOMY LEFT CAROTID ARTERY;  Surgeon: Rosetta Posner, MD;  Location: [place];  Service: Vascular;  Laterality: Left;  . PATCH ANGIOPLASTY Left 04/02/2016   Procedure: PATCH ANGIOPLASTY LEFT CAROTID ARTERY USING HEMASHIELD PLATINUM FINESS PATCH;  Surgeon: Rosetta Posner, MD;  Location[facility];  Service: Vascular;  Laterality: Left;    SOCIAL HISTORY: Social History   Socioeconomic History  . Marital status: Widowed    Spouse name: Not on file  . Number of children: Not on file  . Years of education: Not on file  . Highest education level: Not on file  Social Needs  . Financial resource strain: Not on file  . Food insecurity - worry: Not on file  . Food insecurity - inability: Not on file  . Transportation needs - medical: Not  on file  . Transportation needs - non-medical: Not on file  Occupational History  . Not on file  Tobacco Use  . Smoking status: Current Every Day Smoker    Packs/day: 1.00    Years: 50.00    Pack years: 50.00    Types: Cigarettes  . Smokeless tobacco: Never Used  Substance and Sexual Activity  . Alcohol use: No  . Drug use: No  . Sexual activity: Not on file  Other Topics  Concern  . Not on file  Social History Narrative  . Not on file    FAMILY HISTORY Family History  Problem Relation Age of Onset  . Heart murmur Mother   . Heart Problems Mother   . Emphysema Father   . Cancer Sister   . Dementia Brother   . Colon cancer Neg Hx     ALLERGIES:  is allergic to augmentin [amoxicillin-pot clavulanate]; vicodin [hydrocodone-acetaminophen]; and quinolones.  MEDICATIONS:  Current Outpatient Medications  Medication Sig Dispense Refill  . aspirin 81 MG tablet Take 81 mg by mouth daily.    Marland Kitchen ibuprofen (ADVIL,MOTRIN) 200 MG tablet Take 200-400 mg by mouth daily as needed for headache or moderate pain. For pain    . losartan (COZAAR) 100 MG tablet Take 100 mg by mouth daily.     Marland Kitchen PARoxetine (PAXIL) 40 MG tablet Take 40 mg by mouth every morning.     No current facility-administered medications for this visit.     PHYSICAL EXAMINATION:   Vitals:   04/22/17 1306  BP: (!) 161/68  Pulse: 71  Resp: 16  Temp: 98 F (36.7 C)  SpO2: 98%    Filed Weights   04/22/17 1306  Weight: 96 lb 8 oz (43.8 kg)     Physical Exam  Constitutional: Well-developed, well-nourished, and in no distress.   HENT:  Head: Normocephalic and atraumatic.  Mouth/Throat: No oropharyngeal exudate. Mucosa moist. Eyes: Pupils are equal, round, and reactive to light. Conjunctivae are normal. No scleral icterus.  Neck: Normal range of motion. Neck supple. No JVD present.  Cardiovascular: Normal rate, regular rhythm and normal heart sounds.  Exam reveals no gallop and no friction rub.   No murmur heard. Pulmonary/Chest: Effort normal and breath sounds normal. No respiratory distress. No wheezes.No rales.  Abdominal: Soft. Bowel sounds are normal. No distension. There is no tenderness. There is no guarding.  Musculoskeletal: No edema or tenderness.  Lymphadenopathy:    No cervical or supraclavicular adenopathy.  Neurological: Alert and oriented to person, place, and time. No  cranial nerve deficit.  Skin: Skin is warm and dry. No rash noted. No erythema. No pallor.  Psychiatric: Affect and judgment normal.   LABORATORY DATA: I have personally reviewed the data as listed:  No visits with results within 1 Month(s) from this visit.  Latest known visit with results is:  Admission on 03/05/2017, Discharged on 03/05/2017  Component Date Value Ref Range Status  . Sodium 03/05/2017 138  135 - 145 mmol/L Final  . Potassium 03/05/2017 4.1  3.5 - 5.1 mmol/L Final  . Chloride 03/05/2017 110  101 - 111 mmol/L Final  . BUN 03/05/2017 18  6 - 20 mg/dL Final  . Creatinine, Ser 03/05/2017 0.80  0.44 - 1.00 mg/dL Final  . Glucose, Bld 03/05/2017 92  65 - 99 mg/dL Final  . Calcium, Ion 03/05/2017 1.18  1.15 - 1.40 mmol/L Final  . TCO2 03/05/2017 22  22 - 32 mmol/L Final  . Hemoglobin 03/05/2017 11.2* 12.0 -  15.0 g/dL Final  . HCT 03/05/2017 33.0* 36.0 - 46.0 % Final  . Alcohol, Ethyl (B) 03/05/2017 <10  <10 mg/dL Final   Comment:        LOWEST DETECTABLE LIMIT FOR SERUM ALCOHOL IS 10 mg/dL FOR MEDICAL PURPOSES ONLY   . Prothrombin Time 03/05/2017 12.4  11.4 - 15.2 seconds Final  . INR 03/05/2017 0.93   Final  . aPTT 03/05/2017 27  24 - 36 seconds Final  . WBC 03/05/2017 7.2  4.0 - 10.5 K/uL Final  . RBC 03/05/2017 4.26  3.87 - 5.11 MIL/uL Final  . Hemoglobin 03/05/2017 10.7* 12.0 - 15.0 g/dL Final  . HCT 03/05/2017 33.9* 36.0 - 46.0 % Final  . MCV 03/05/2017 79.6  78.0 - 100.0 fL Final  . MCH 03/05/2017 25.1* 26.0 - 34.0 pg Final  . MCHC 03/05/2017 31.6  30.0 - 36.0 g/dL Final  . RDW 03/05/2017 17.2* 11.5 - 15.5 % Final  . Platelets 03/05/2017 374  150 - 400 K/uL Final  . Neutrophils Relative % 03/05/2017 63  % Final  . Neutro Abs 03/05/2017 4.6  1.7 - 7.7 K/uL Final  . Lymphocytes Relative 03/05/2017 23  % Final  . Lymphs Abs 03/05/2017 1.7  0.7 - 4.0 K/uL Final  . Monocytes Relative 03/05/2017 12  % Final  . Monocytes Absolute 03/05/2017 0.9  0.1 - 1.0 K/uL  Final  . Eosinophils Relative 03/05/2017 1  % Final  . Eosinophils Absolute 03/05/2017 0.1  0.0 - 0.7 K/uL Final  . Basophils Relative 03/05/2017 1  % Final  . Basophils Absolute 03/05/2017 0.1  0.0 - 0.1 K/uL Final  . Sodium 03/05/2017 136  135 - 145 mmol/L Final  . Potassium 03/05/2017 4.0  3.5 - 5.1 mmol/L Final  . Chloride 03/05/2017 105  101 - 111 mmol/L Final  . CO2 03/05/2017 21* 22 - 32 mmol/L Final  . Glucose, Bld 03/05/2017 93  65 - 99 mg/dL Final  . BUN 03/05/2017 19  6 - 20 mg/dL Final  . Creatinine, Ser 03/05/2017 0.90  0.44 - 1.00 mg/dL Final  . Calcium 03/05/2017 10.0  8.9 - 10.3 mg/dL Final  . Total Protein 03/05/2017 7.5  6.5 - 8.1 g/dL Final  . Albumin 03/05/2017 3.9  3.5 - 5.0 g/dL Final  . AST 03/05/2017 26  15 - 41 U/L Final  . ALT 03/05/2017 17  14 - 54 U/L Final  . Alkaline Phosphatase 03/05/2017 73  38 - 126 U/L Final  . Total Bilirubin 03/05/2017 0.6  0.3 - 1.2 mg/dL Final  . GFR calc non Af Amer 03/05/2017 >60  >60 mL/min Final  . GFR calc Af Amer 03/05/2017 >60  >60 mL/min Final   Comment: (NOTE) The eGFR has been calculated using the CKD EPI equation. This calculation has not been validated in all clinical situations. eGFR's persistently <60 mL/min signify possible Chronic Kidney Disease.   . Anion gap 03/05/2017 10  5 - 15 Final  . Troponin i, poc 03/05/2017 0.00  0.00 - 0.08 ng/mL Final  . Comment 3 03/05/2017          Final   Comment: Due to the release kinetics of cTnI, a negative result within the first hours of the onset of symptoms does not rule out myocardial infarction with certainty. If myocardial infarction is still suspected, repeat the test at appropriate intervals.   . Opiates 03/05/2017 NONE DETECTED  NONE DETECTED Final  . Cocaine 03/05/2017 NONE DETECTED  NONE DETECTED Final  .  Benzodiazepines 03/05/2017 NONE DETECTED  NONE DETECTED Final  . Amphetamines 03/05/2017 NONE DETECTED  NONE DETECTED Final  . Tetrahydrocannabinol  03/05/2017 NONE DETECTED  NONE DETECTED Final  . Barbiturates 03/05/2017 NONE DETECTED  NONE DETECTED Final   Comment:        DRUG SCREEN FOR MEDICAL PURPOSES ONLY.  IF CONFIRMATION IS NEEDED FOR ANY PURPOSE, NOTIFY LAB WITHIN 5 DAYS.        LOWEST DETECTABLE LIMITS FOR URINE DRUG SCREEN Drug Class       Cutoff (ng/mL) Amphetamine      1000 Barbiturate      200 Benzodiazepine   754 Tricyclics       492 Opiates          300 Cocaine          300 THC              50   . Color, Urine 03/05/2017 YELLOW  YELLOW Final  . APPearance 03/05/2017 CLEAR  CLEAR Final  . Specific Gravity, Urine 03/05/2017 1.019  1.005 - 1.030 Final  . pH 03/05/2017 5.0  5.0 - 8.0 Final  . Glucose, UA 03/05/2017 NEGATIVE  NEGATIVE mg/dL Final  . Hgb urine dipstick 03/05/2017 SMALL* NEGATIVE Final  . Bilirubin Urine 03/05/2017 NEGATIVE  NEGATIVE Final  . Ketones, ur 03/05/2017 5* NEGATIVE mg/dL Final  . Protein, ur 03/05/2017 NEGATIVE  NEGATIVE mg/dL Final  . Nitrite 03/05/2017 NEGATIVE  NEGATIVE Final  . Leukocytes, UA 03/05/2017 TRACE* NEGATIVE Final  . RBC / HPF 03/05/2017 6-30  0 - 5 RBC/hpf Final  . WBC, UA 03/05/2017 0-5  0 - 5 WBC/hpf Final  . Bacteria, UA 03/05/2017 RARE* NONE SEEN Final  . Squamous Epithelial / LPF 03/05/2017 0-5* NONE SEEN Final  . Mucus 03/05/2017 PRESENT   Final    RADIOGRAPHIC STUDIES: I have personally reviewed the radiological images as listed and agree with the findings in the report  No results found.  ASSESSMENT:  Microcytic anemia secondary to severe iron deficiency from likely slow GI bleed from angiodysplastic lesions in colon. Ferritin 8.  PLAN: -Reviewed her labs with her. -Discussed giving her 2 doses of feraheme to boost up her iron levels and patient is agreeable. We will get it set up ASAP. -RTC in 2 months for follow up with labs to assess response to IV iron. Goal is to get correct her anemia and to get her ferritin above 100.  Orders Placed This  Encounter  Procedures  . CBC with Differential    Standing Status:   Future    Standing Expiration Date:   04/22/2018  . Comprehensive metabolic panel    Standing Status:   Future    Standing Expiration Date:   04/22/2018  . Iron and TIBC    Standing Status:   Future    Standing Expiration Date:   04/22/2018  . Ferritin    Standing Status:   Future    Standing Expiration Date:   04/22/2018  . Vitamin B12    Standing Status:   Future    Standing Expiration Date:   04/22/2018  . Folate    Standing Status:   Future    Standing Expiration Date:   04/22/2018  . Reticulocytes    Standing Status:   Future    Standing Expiration Date:   04/22/2018    All questions were answered. The patient knows to call the clinic with any problems, questions or concerns.  This note was electronically signed.  Twana First, MD  04/22/2017 1:30 PM

## 2017-04-23 ENCOUNTER — Encounter (HOSPITAL_COMMUNITY): Payer: Self-pay

## 2017-04-23 ENCOUNTER — Other Ambulatory Visit: Payer: Self-pay

## 2017-04-23 ENCOUNTER — Encounter (HOSPITAL_BASED_OUTPATIENT_CLINIC_OR_DEPARTMENT_OTHER): Payer: Medicare Other

## 2017-04-23 VITALS — BP 148/62 | HR 68 | Temp 98.6°F | Resp 20 | Wt 97.4 lb

## 2017-04-23 DIAGNOSIS — D500 Iron deficiency anemia secondary to blood loss (chronic): Secondary | ICD-10-CM

## 2017-04-23 DIAGNOSIS — D509 Iron deficiency anemia, unspecified: Secondary | ICD-10-CM | POA: Diagnosis not present

## 2017-04-23 MED ORDER — SODIUM CHLORIDE 0.9 % IV SOLN
INTRAVENOUS | Status: DC
  Administered 2017-04-23: 11:00:00 via INTRAVENOUS

## 2017-04-23 MED ORDER — SODIUM CHLORIDE 0.9 % IV SOLN
510.0000 mg | Freq: Once | INTRAVENOUS | Status: AC
  Administered 2017-04-23: 510 mg via INTRAVENOUS
  Filled 2017-04-23: qty 17

## 2017-04-23 NOTE — Progress Notes (Signed)
Tolerated infusion w/o adverse reaction.  Alert, in no distress.  VSS.  Discharged ambulatory.  

## 2017-04-30 ENCOUNTER — Ambulatory Visit (HOSPITAL_COMMUNITY): Payer: Medicare Other

## 2017-04-30 ENCOUNTER — Encounter (HOSPITAL_COMMUNITY): Payer: Medicare Other | Attending: Oncology

## 2017-04-30 ENCOUNTER — Encounter (HOSPITAL_COMMUNITY): Payer: Self-pay

## 2017-04-30 VITALS — BP 142/46 | HR 62 | Temp 97.9°F | Resp 18

## 2017-04-30 DIAGNOSIS — D500 Iron deficiency anemia secondary to blood loss (chronic): Secondary | ICD-10-CM | POA: Insufficient documentation

## 2017-04-30 DIAGNOSIS — D509 Iron deficiency anemia, unspecified: Secondary | ICD-10-CM | POA: Diagnosis not present

## 2017-04-30 MED ORDER — FERUMOXYTOL INJECTION 510 MG/17 ML
510.0000 mg | Freq: Once | INTRAVENOUS | Status: AC
  Administered 2017-04-30: 510 mg via INTRAVENOUS
  Filled 2017-04-30: qty 17

## 2017-04-30 MED ORDER — SODIUM CHLORIDE 0.9 % IV SOLN
INTRAVENOUS | Status: DC
  Administered 2017-04-30: 14:00:00 via INTRAVENOUS

## 2017-04-30 NOTE — Progress Notes (Signed)
Patient for feraheme and no complaints with first infusion.    Patient requesting to leave before 30 minute post infusion time.  Reviewed side effects and why patients are kept for monitoring after iron infusion.  Patient verbalized understanding and still requested to leave early.    Patient tolerated iron infusion with no complaints voiced.  Good blood return noted before and after administration of iron.  Peripheral IV site clean and dry with no bruising or swelling noted at site.  Band aid applied.  VSS with discharge and left ambulatory with no s/s of distress noted.

## 2017-04-30 NOTE — Patient Instructions (Signed)
[facility]  Discharge Instructions:  You received an iron infusion today.  _______________________________________________________________  Thank you for choosing  [facility] to provide your oncology and hematology care.  To afford each patient quality time with our providers, please arrive at least 15 minutes before your scheduled appointment.  You need to re-schedule your appointment if you arrive 10 or more minutes late.  We strive to give you quality time with our providers, and arriving late affects you and other patients whose appointments are after yours.  Also, if you no show three or more times for appointments you may be dismissed from the clinic.  Again, thank you for choosing  [facility]. Our hope is that these requests will allow you access to exceptional care and in a timely manner. _______________________________________________________________  If you have questions after your visit, please contact our office at [phone] between the hours of 8:30 a.m. and 5:00 p.m. Voicemails left after 4:30 p.m. will not be returned until the following business day. _______________________________________________________________  For prescription refill requests, have your pharmacy contact our office. _______________________________________________________________  Recommendations made by the consultant and any test results will be sent to your referring physician. _______________________________________________________________ 

## 2017-05-01 ENCOUNTER — Ambulatory Visit (HOSPITAL_COMMUNITY)
Admission: RE | Admit: 2017-05-01 | Discharge: 2017-05-01 | Disposition: A | Discharge status: Home / Self Care | Payer: Medicare Other | Source: Ambulatory Visit | Attending: Internal Medicine | Admitting: Internal Medicine

## 2017-05-01 DIAGNOSIS — J984 Other disorders of lung: Secondary | ICD-10-CM | POA: Diagnosis present

## 2017-05-01 DIAGNOSIS — R918 Other nonspecific abnormal finding of lung field: Secondary | ICD-10-CM | POA: Insufficient documentation

## 2017-05-01 DIAGNOSIS — N200 Calculus of kidney: Secondary | ICD-10-CM | POA: Diagnosis not present

## 2017-05-01 DIAGNOSIS — R911 Solitary pulmonary nodule: Secondary | ICD-10-CM | POA: Diagnosis not present

## 2017-05-01 DIAGNOSIS — I700 Atherosclerosis of aorta: Secondary | ICD-10-CM | POA: Insufficient documentation

## 2017-05-01 DIAGNOSIS — J439 Emphysema, unspecified: Secondary | ICD-10-CM | POA: Insufficient documentation

## 2017-05-05 ENCOUNTER — Encounter (HOSPITAL_COMMUNITY): Payer: Medicare Other

## 2017-05-05 ENCOUNTER — Ambulatory Visit: Payer: Medicare Other | Admitting: Family

## 2017-05-11 ENCOUNTER — Encounter (HOSPITAL_COMMUNITY): Payer: Self-pay

## 2017-05-11 ENCOUNTER — Ambulatory Visit (HOSPITAL_COMMUNITY)
Admission: RE | Admit: 2017-05-11 | Discharge: 2017-05-11 | Disposition: A | Discharge status: Home / Self Care | Payer: Medicare Other | Source: Ambulatory Visit | Attending: Internal Medicine | Admitting: Internal Medicine

## 2017-05-11 DIAGNOSIS — Z1231 Encounter for screening mammogram for malignant neoplasm of breast: Secondary | ICD-10-CM

## 2017-05-12 ENCOUNTER — Ambulatory Visit (HOSPITAL_COMMUNITY): Payer: Medicare Other

## 2017-06-16 ENCOUNTER — Inpatient Hospital Stay (HOSPITAL_COMMUNITY): Payer: Medicare Other | Attending: Oncology

## 2017-06-16 DIAGNOSIS — D509 Iron deficiency anemia, unspecified: Secondary | ICD-10-CM | POA: Diagnosis not present

## 2017-06-16 DIAGNOSIS — I10 Essential (primary) hypertension: Secondary | ICD-10-CM | POA: Diagnosis not present

## 2017-06-16 DIAGNOSIS — Z720 Tobacco use: Secondary | ICD-10-CM | POA: Insufficient documentation

## 2017-06-16 DIAGNOSIS — D500 Iron deficiency anemia secondary to blood loss (chronic): Secondary | ICD-10-CM

## 2017-06-16 LAB — CBC WITH DIFFERENTIAL/PLATELET
BASOS PCT: 1 %
Basophils Absolute: 0.1 10*3/uL (ref 0.0–0.1)
Eosinophils Absolute: 0 10*3/uL (ref 0.0–0.7)
Eosinophils Relative: 0 %
HEMATOCRIT: 41.1 % (ref 36.0–46.0)
HEMOGLOBIN: 13.2 g/dL (ref 12.0–15.0)
Lymphocytes Relative: 18 %
Lymphs Abs: 1.1 10*3/uL (ref 0.7–4.0)
MCH: 28.9 pg (ref 26.0–34.0)
MCHC: 32.1 g/dL (ref 30.0–36.0)
MCV: 89.9 fL (ref 78.0–100.0)
Monocytes Absolute: 0.5 10*3/uL (ref 0.1–1.0)
Monocytes Relative: 8 %
NEUTROS ABS: 4.5 10*3/uL (ref 1.7–7.7)
NEUTROS PCT: 73 %
Platelets: 303 10*3/uL (ref 150–400)
RBC: 4.57 MIL/uL (ref 3.87–5.11)
RDW: 18 % — ABNORMAL HIGH (ref 11.5–15.5)
WBC: 6.1 10*3/uL (ref 4.0–10.5)

## 2017-06-16 LAB — COMPREHENSIVE METABOLIC PANEL
ALBUMIN: 4.2 g/dL (ref 3.5–5.0)
ALT: 17 U/L (ref 14–54)
AST: 22 U/L (ref 15–41)
Alkaline Phosphatase: 70 U/L (ref 38–126)
Anion gap: 14 (ref 5–15)
BILIRUBIN TOTAL: 0.5 mg/dL (ref 0.3–1.2)
BUN: 18 mg/dL (ref 6–20)
CALCIUM: 9.9 mg/dL (ref 8.9–10.3)
CO2: 22 mmol/L (ref 22–32)
Chloride: 103 mmol/L (ref 101–111)
Creatinine, Ser: 0.9 mg/dL (ref 0.44–1.00)
GFR calc Af Amer: >60 mL/min (ref >60)
GFR calc non Af Amer: >60 mL/min (ref >60)
GLUCOSE: 105 mg/dL — AB (ref 65–99)
POTASSIUM: 4.4 mmol/L (ref 3.5–5.1)
SODIUM: 139 mmol/L (ref 135–145)
TOTAL PROTEIN: 7.7 g/dL (ref 6.5–8.1)

## 2017-06-16 LAB — IRON AND TIBC
Iron: 102 ug/dL (ref 28–170)
Saturation Ratios: 26 % (ref 10.4–31.8)
TIBC: 388 ug/dL (ref 250–450)
UIBC: 286 ug/dL

## 2017-06-16 LAB — VITAMIN B12: Vitamin B-12: 357 pg/mL (ref 180–914)

## 2017-06-16 LAB — FOLATE: FOLATE: 26.8 ng/mL (ref >5.9)

## 2017-06-16 LAB — RETICULOCYTES
RBC.: 4.57 MIL/uL (ref 3.87–5.11)
RETIC CT PCT: 0.7 % (ref 0.4–3.1)
Retic Count, Absolute: 32 10*3/uL (ref 19.0–186.0)

## 2017-06-16 LAB — FERRITIN: Ferritin: 133 ng/mL (ref 11–307)

## 2017-06-23 ENCOUNTER — Encounter (HOSPITAL_COMMUNITY): Payer: Self-pay | Admitting: Internal Medicine

## 2017-06-23 ENCOUNTER — Inpatient Hospital Stay (HOSPITAL_BASED_OUTPATIENT_CLINIC_OR_DEPARTMENT_OTHER): Payer: Medicare Other | Admitting: Internal Medicine

## 2017-06-23 ENCOUNTER — Other Ambulatory Visit: Payer: Self-pay

## 2017-06-23 VITALS — BP 148/96 | HR 67 | Temp 97.9°F | Resp 18 | Ht 61.5 in | Wt 93.5 lb

## 2017-06-23 DIAGNOSIS — Z720 Tobacco use: Secondary | ICD-10-CM

## 2017-06-23 DIAGNOSIS — I10 Essential (primary) hypertension: Secondary | ICD-10-CM | POA: Diagnosis not present

## 2017-06-23 DIAGNOSIS — D509 Iron deficiency anemia, unspecified: Secondary | ICD-10-CM

## 2017-06-23 DIAGNOSIS — D500 Iron deficiency anemia secondary to blood loss (chronic): Secondary | ICD-10-CM

## 2017-06-23 NOTE — Progress Notes (Signed)
[facility] Follow up:    Redmond School, MD [address] [place] [address]   DIAGNOSIS: IDA  Assessment/plan: 1.  Iron Deficiency anemia.  The patient was treated with IV iron.  Hemoglobin is within normal limits at 13.  She denies any complaints today.  She should follow-up with GI as recommended.  She will return to clinic in 4-6 months for follow-up and labs.  She should notify the office if she has any problems prior to her next visit.    2.  Hemorrhoids/angiodysplastic lesion.  This was noted on colonoscopy done in August 2018.  She should follow-up with GI as recommended.  3.  Hypertension.  Blood pressure is 148/96.  Follow-up with primary care physician as recommended.  Interval History:  77 y.o. Female seen initially by Dr. Leata Mouse for evaluation of iron deficiency anemia.  She was treated with IV iron.   She had a colonoscopy on 01/22/17 which demonstrated five non-bleeding colonic angiodysplastic lesions, diverticulosis in the sigmoid colon, external hemorrhoids.   Current status:  Patient is seen today for follow-up after IV iron.  She reports she is doing well and denies any complaints.    SUMMARY OF ONCOLOGIC HISTORY:  No history exists.    CURRENT THERAPY:IV iron as indicated  Patient Active Problem List   Diagnosis Date Noted  . Iron deficiency anemia due to chronic blood loss 04/22/2017  . History of colonic polyps 09/18/2016  . Carotid stenosis 04/02/2016    is allergic to augmentin [amoxicillin-pot clavulanate]; vicodin [hydrocodone-acetaminophen]; and quinolones.  MEDICAL HISTORY: Past Medical History:  Diagnosis Date  . Anemia   . Anxiety   . Arthritis   . Carotid artery occlusion   . Chronic kidney disease   . Colon polyps   . COPD (chronic obstructive pulmonary disease) ([place])   . Depression   . Dyspnea   . Heart murmur   . Hypertension   . Pneumonia   . PONV (postoperative nausea and vomiting)     SURGICAL  HISTORY: Past Surgical History:  Procedure Laterality Date  . CAROTID ENDARTERECTOMY    . CATARACT EXTRACTION W/PHACO Left 01/02/2014   Procedure: CATARACT EXTRACTION PHACO AND INTRAOCULAR LENS PLACEMENT (IOC);  Surgeon: Tonny Branch, MD;  Location[facility];  Service: Ophthalmology;  Laterality: Left;  CDE 8.17  . CATARACT EXTRACTION W/PHACO Right 03/06/2014   Procedure: CATARACT EXTRACTION PHACO AND INTRAOCULAR LENS PLACEMENT ([place]);  Surgeon: Tonny Branch, MD;  Location: [facility];  Service: Ophthalmology;  Laterality: Right;  CDE:15.14  . CHOLECYSTECTOMY    . COLONOSCOPY  11/13/2011   Procedure: COLONOSCOPY;  Surgeon: Rogene Houston, MD;  [facility];  Service: Endoscopy;  Laterality: N/A;  1030  . COLONOSCOPY N/A 01/22/2017   Procedure: COLONOSCOPY;  Surgeon: Rogene Houston, MD;  [facility];  Service: Endoscopy;  Laterality: N/A;  1030  . ENDARTERECTOMY Left 04/02/2016   Procedure: ENDARTERECTOMY LEFT CAROTID ARTERY;  Surgeon: Rosetta Posner, MD;  Location: [place];  Service: Vascular;  Laterality: Left;  . PATCH ANGIOPLASTY Left 04/02/2016   Procedure: PATCH ANGIOPLASTY LEFT CAROTID ARTERY USING HEMASHIELD PLATINUM FINESS PATCH;  Surgeon: Rosetta Posner, MD;  [facility] OR;  Service: Vascular;  Laterality: Left;    SOCIAL HISTORY: Social History   Socioeconomic History  . Marital status: Widowed    Spouse name: Not on file  . Number of children: Not on file  . Years of education: Not on file  .  Highest education level: Not on file  Social Needs  . Financial resource strain: Not on file  . Food insecurity - worry: Not on file  . Food insecurity - inability: Not on file  . Transportation needs - medical: Not on file  . Transportation needs - non-medical: Not on file  Occupational History  . Not on file  Tobacco Use  . Smoking status: Current Every Day Smoker    Packs/day: 1.00    Years: 50.00    Pack years: 50.00    Types: Cigarettes  . Smokeless tobacco: Never  Used  Substance and Sexual Activity  . Alcohol use: No  . Drug use: No  . Sexual activity: Not on file  Other Topics Concern  . Not on file  Social History Narrative  . Not on file    FAMILY HISTORY: Family History  Problem Relation Age of Onset  . Heart murmur Mother   . Heart Problems Mother   . Emphysema Father   . Cancer Sister   . Dementia Brother   . Colon cancer Neg Hx    Prior to Admission medications   Medication Sig Start Date End Date Taking? Authorizing Provider  aspirin 81 MG tablet Take 81 mg by mouth daily.   Yes [provider]  ibuprofen (ADVIL,MOTRIN) 200 MG tablet Take 200-400 mg by mouth daily as needed for headache or moderate pain. For pain   Yes [provider]  losartan (COZAAR) 100 MG tablet Take 100 mg by mouth daily.    Yes [provider]  PARoxetine (PAXIL) 40 MG tablet Take 40 mg by mouth every morning.   Yes [provider]   Review of Systems - Oncology 10 point is negative   PHYSICAL EXAMINATION  ECOG PERFORMANCE STATUS: 1  Vitals:   06/23/17 1507  BP: (!) 148/96  Pulse: 67  Resp: 18  Temp: 97.9 F (36.6 C)  SpO2: 97%    Physical Exam:  Patient appears well, alert and oriented x 3, pleasant, cooperative. Vitals are as noted. Neck supple and free of adenopathy, or masses. No thyromegaly. PERLA. Ears, throat are normal. Lungs are clear to auscultation. Heart sounds are normal, no murmurs, clicks, gallops or rubs. Abdomen is soft, no tenderness, masses or organomegaly.  Extremities are normal. Peripheral pulses are normal. Neurological exam is normal without focal findings. Skin is normal without suspicious lesions noted.  LABORATORY DATA:  CBC    Component Value Date/Time   WBC 6.1 06/16/2017 1254   RBC 4.57 06/16/2017 1254   RBC 4.57 06/16/2017 1254   HGB 13.2 06/16/2017 1254   HCT 41.1 06/16/2017 1254   PLT 303 06/16/2017 1254   MCV 89.9 06/16/2017 1254   MCH 28.9 06/16/2017 1254   MCHC  32.1 06/16/2017 1254   RDW 18.0 (H) 06/16/2017 1254   LYMPHSABS 1.1 06/16/2017 1254   MONOABS 0.5 06/16/2017 1254   EOSABS 0.0 06/16/2017 1254   BASOSABS 0.1 06/16/2017 1254    CMP     Component Value Date/Time   NA 139 06/16/2017 1254   K 4.4 06/16/2017 1254   CL 103 06/16/2017 1254   CO2 22 06/16/2017 1254   GLUCOSE 105 (H) 06/16/2017 1254   BUN 18 06/16/2017 1254   CREATININE 0.90 06/16/2017 1254   CALCIUM 9.9 06/16/2017 1254   PROT 7.7 06/16/2017 1254   ALBUMIN 4.2 06/16/2017 1254   AST 22 06/16/2017 1254   ALT 17 06/16/2017 1254   ALKPHOS 70 06/16/2017 1254  BILITOT 0.5 06/16/2017 1254   GFRNONAA >60 06/16/2017 1254   GFRAA >60 06/16/2017 1254    ASSESSMENT and THERAPY PLAN: As above    Orders Placed This Encounter  Procedures  . CBC with Differential/Platelet    Standing Status:   Future    Standing Expiration Date:   06/23/2018  . Comprehensive metabolic panel    Standing Status:   Future    Standing Expiration Date:   06/23/2018  . Lactate dehydrogenase    Standing Status:   Future    Standing Expiration Date:   06/23/2018  . Protein electrophoresis, serum    Standing Status:   Future    Standing Expiration Date:   06/23/2018  . Ferritin    Standing Status:   Future    Standing Expiration Date:   06/23/2018    All questions were answered. The patient knows to call the clinic with any problems, questions or concerns. We can certainly see the patient much sooner if necessary. This note was electronically signed. Zoila Shutter, MD 06/23/2017

## 2017-06-23 NOTE — Progress Notes (Deleted)
x [facility] Initial Visit:  Patient Care Team: Redmond School, MD as PCP - General (Internal Medicine)  CHIEF COMPLAINTS/PURPOSE OF CONSULTATION:  Iron deficiency anemia  HISTORY OF PRESENTING ILLNESS: Sophia Coleman 77 y.o. female presents for evaluation of iron deficiency anemia.  Patient has been feeling very fatigued for the past few months.  She had blood work performed on 03/04/2017 which demonstrated WBC 8.3K, hemoglobin 10.5 g/dL, hematocrit 32.3%, MCV 76, platelet count 419K, ferritin 8, TIBC 527, serum iron 18, iron saturation 3%, vitamin B12 455, folate greater than 20.  Previous CBC from 9.918 demonstrated hemoglobin 10.5 g/dL, hematocrit 33.9%, MCV 79.  Previous CBC from Tuesday 1518 demonstrated hemoglobin 9.5 g/dL, hematocrit 29.6%, MCV 74.  She had a colonoscopy on 01/22/17 which demonstrated five non-bleeding colonic angiodysplastic lesions, diverticulosis in the sigmoid colon, external hemorrhoids. She states that she has been feeling fatigued with some mild generalized weakness, but denies any palpitations, lightheadedness, chest pain, shortness of breath.  She denies noting any evidence of bleeding including melena, hematochezia, hematuria, hemoptysis. She has been on an oral iron supplement daily for the past few months with no improvement in her anemia and it causes her to have looser stools.  Review of Systems - Oncology ROS as per HPI otherwise 12 point ROS is negative.  MEDICAL HISTORY: Past Medical History:  Diagnosis Date  . Anemia   . Anxiety   . Arthritis   . Carotid artery occlusion   . Chronic kidney disease   . Colon polyps   . COPD (chronic obstructive pulmonary disease) ([place])   . Depression   . Dyspnea   . Heart murmur   . Hypertension   . Pneumonia   . PONV (postoperative nausea and vomiting)     SURGICAL HISTORY: Past Surgical History:  Procedure Laterality Date  . CAROTID ENDARTERECTOMY    . CATARACT EXTRACTION W/PHACO  Left 01/02/2014   Procedure: CATARACT EXTRACTION PHACO AND INTRAOCULAR LENS PLACEMENT (IOC);  Surgeon: Tonny Branch, MD;  Location: [facility];  Service: Ophthalmology;  Laterality: Left;  CDE 8.17  . CATARACT EXTRACTION W/PHACO Right 03/06/2014   Procedure: CATARACT EXTRACTION PHACO AND INTRAOCULAR LENS PLACEMENT ([place]);  Surgeon: Tonny Branch, MD;  Location: [facility];  Service: Ophthalmology;  Laterality: Right;  CDE:15.14  . CHOLECYSTECTOMY    . COLONOSCOPY  11/13/2011   Procedure: COLONOSCOPY;  Surgeon: Rogene Houston, MD;  Location: [facility];  Service: Endoscopy;  Laterality: N/A;  1030  . COLONOSCOPY N/A 01/22/2017   Procedure: COLONOSCOPY;  Surgeon: Rogene Houston, MD;  [facility];  Service: Endoscopy;  Laterality: N/A;  1030  . ENDARTERECTOMY Left 04/02/2016   Procedure: ENDARTERECTOMY LEFT CAROTID ARTERY;  Surgeon: Rosetta Posner, MD;  Location: [place];  Service: Vascular;  Laterality: Left;  . PATCH ANGIOPLASTY Left 04/02/2016   Procedure: PATCH ANGIOPLASTY LEFT CAROTID ARTERY USING HEMASHIELD PLATINUM FINESS PATCH;  Surgeon: Rosetta Posner, MD;  Location: [facility];  Service: Vascular;  Laterality: Left;    SOCIAL HISTORY: Social History   Socioeconomic History  . Marital status: Widowed    Spouse name: Not on file  . Number of children: Not on file  . Years of education: Not on file  . Highest education level: Not on file  Social Needs  . Financial resource strain: Not on file  . Food insecurity - worry: Not on file  . Food insecurity - inability: Not on file  . Transportation needs - medical:  Not on file  . Transportation needs - non-medical: Not on file  Occupational History  . Not on file  Tobacco Use  . Smoking status: Current Every Day Smoker    Packs/day: 1.00    Years: 50.00    Pack years: 50.00    Types: Cigarettes  . Smokeless tobacco: Never Used  Substance and Sexual Activity  . Alcohol use: No  . Drug use: No  . Sexual activity: Not on file  Other Topics  Concern  . Not on file  Social History Narrative  . Not on file    FAMILY HISTORY Family History  Problem Relation Age of Onset  . Heart murmur Mother   . Heart Problems Mother   . Emphysema Father   . Cancer Sister   . Dementia Brother   . Colon cancer Neg Hx     ALLERGIES:  is allergic to augmentin [amoxicillin-pot clavulanate]; vicodin [hydrocodone-acetaminophen]; and quinolones.  MEDICATIONS:  Current Outpatient Medications  Medication Sig Dispense Refill  . aspirin 81 MG tablet Take 81 mg by mouth daily.    Marland Kitchen ibuprofen (ADVIL,MOTRIN) 200 MG tablet Take 200-400 mg by mouth daily as needed for headache or moderate pain. For pain    . losartan (COZAAR) 100 MG tablet Take 100 mg by mouth daily.     Marland Kitchen PARoxetine (PAXIL) 40 MG tablet Take 40 mg by mouth every morning.     No current facility-administered medications for this visit.     PHYSICAL EXAMINATION:   Vitals:   04/22/17 1306  BP: (!) 161/68  Pulse: 71  Resp: 16  Temp: 98 F (36.7 C)  SpO2: 98%    Filed Weights   04/22/17 1306  Weight: 96 lb 8 oz (43.8 kg)     Physical Exam  Constitutional: Well-developed, well-nourished, and in no distress.   HENT:  Head: Normocephalic and atraumatic.  Mouth/Throat: No oropharyngeal exudate. Mucosa moist. Eyes: Pupils are equal, round, and reactive to light. Conjunctivae are normal. No scleral icterus.  Neck: Normal range of motion. Neck supple. No JVD present.  Cardiovascular: Normal rate, regular rhythm and normal heart sounds.  Exam reveals no gallop and no friction rub.   No murmur heard. Pulmonary/Chest: Effort normal and breath sounds normal. No respiratory distress. No wheezes.No rales.  Abdominal: Soft. Bowel sounds are normal. No distension. There is no tenderness. There is no guarding.  Musculoskeletal: No edema or tenderness.  Lymphadenopathy:    No cervical or supraclavicular adenopathy.  Neurological: Alert and oriented to person, place, and time. No  cranial nerve deficit.  Skin: Skin is warm and dry. No rash noted. No erythema. No pallor.  Psychiatric: Affect and judgment normal.   LABORATORY DATA: I have personally reviewed the data as listed:  No visits with results within 1 Month(s) from this visit.  Latest known visit with results is:  Admission on 03/05/2017, Discharged on 03/05/2017  Component Date Value Ref Range Status  . Sodium 03/05/2017 138  135 - 145 mmol/L Final  . Potassium 03/05/2017 4.1  3.5 - 5.1 mmol/L Final  . Chloride 03/05/2017 110  101 - 111 mmol/L Final  . BUN 03/05/2017 18  6 - 20 mg/dL Final  . Creatinine, Ser 03/05/2017 0.80  0.44 - 1.00 mg/dL Final  . Glucose, Bld 03/05/2017 92  65 - 99 mg/dL Final  . Calcium, Ion 03/05/2017 1.18  1.15 - 1.40 mmol/L Final  . TCO2 03/05/2017 22  22 - 32 mmol/L Final  . Hemoglobin 03/05/2017 11.2*  12.0 - 15.0 g/dL Final  . HCT 03/05/2017 33.0* 36.0 - 46.0 % Final  . Alcohol, Ethyl (B) 03/05/2017 <10  <10 mg/dL Final   Comment:        LOWEST DETECTABLE LIMIT FOR SERUM ALCOHOL IS 10 mg/dL FOR MEDICAL PURPOSES ONLY   . Prothrombin Time 03/05/2017 12.4  11.4 - 15.2 seconds Final  . INR 03/05/2017 0.93   Final  . aPTT 03/05/2017 27  24 - 36 seconds Final  . WBC 03/05/2017 7.2  4.0 - 10.5 K/uL Final  . RBC 03/05/2017 4.26  3.87 - 5.11 MIL/uL Final  . Hemoglobin 03/05/2017 10.7* 12.0 - 15.0 g/dL Final  . HCT 03/05/2017 33.9* 36.0 - 46.0 % Final  . MCV 03/05/2017 79.6  78.0 - 100.0 fL Final  . MCH 03/05/2017 25.1* 26.0 - 34.0 pg Final  . MCHC 03/05/2017 31.6  30.0 - 36.0 g/dL Final  . RDW 03/05/2017 17.2* 11.5 - 15.5 % Final  . Platelets 03/05/2017 374  150 - 400 K/uL Final  . Neutrophils Relative % 03/05/2017 63  % Final  . Neutro Abs 03/05/2017 4.6  1.7 - 7.7 K/uL Final  . Lymphocytes Relative 03/05/2017 23  % Final  . Lymphs Abs 03/05/2017 1.7  0.7 - 4.0 K/uL Final  . Monocytes Relative 03/05/2017 12  % Final  . Monocytes Absolute 03/05/2017 0.9  0.1 - 1.0 K/uL  Final  . Eosinophils Relative 03/05/2017 1  % Final  . Eosinophils Absolute 03/05/2017 0.1  0.0 - 0.7 K/uL Final  . Basophils Relative 03/05/2017 1  % Final  . Basophils Absolute 03/05/2017 0.1  0.0 - 0.1 K/uL Final  . Sodium 03/05/2017 136  135 - 145 mmol/L Final  . Potassium 03/05/2017 4.0  3.5 - 5.1 mmol/L Final  . Chloride 03/05/2017 105  101 - 111 mmol/L Final  . CO2 03/05/2017 21* 22 - 32 mmol/L Final  . Glucose, Bld 03/05/2017 93  65 - 99 mg/dL Final  . BUN 03/05/2017 19  6 - 20 mg/dL Final  . Creatinine, Ser 03/05/2017 0.90  0.44 - 1.00 mg/dL Final  . Calcium 03/05/2017 10.0  8.9 - 10.3 mg/dL Final  . Total Protein 03/05/2017 7.5  6.5 - 8.1 g/dL Final  . Albumin 03/05/2017 3.9  3.5 - 5.0 g/dL Final  . AST 03/05/2017 26  15 - 41 U/L Final  . ALT 03/05/2017 17  14 - 54 U/L Final  . Alkaline Phosphatase 03/05/2017 73  38 - 126 U/L Final  . Total Bilirubin 03/05/2017 0.6  0.3 - 1.2 mg/dL Final  . GFR calc non Af Amer 03/05/2017 >60  >60 mL/min Final  . GFR calc Af Amer 03/05/2017 >60  >60 mL/min Final   Comment: (NOTE) The eGFR has been calculated using the CKD EPI equation. This calculation has not been validated in all clinical situations. eGFR's persistently <60 mL/min signify possible Chronic Kidney Disease.   . Anion gap 03/05/2017 10  5 - 15 Final  . Troponin i, poc 03/05/2017 0.00  0.00 - 0.08 ng/mL Final  . Comment 3 03/05/2017          Final   Comment: Due to the release kinetics of cTnI, a negative result within the first hours of the onset of symptoms does not rule out myocardial infarction with certainty. If myocardial infarction is still suspected, repeat the test at appropriate intervals.   . Opiates 03/05/2017 NONE DETECTED  NONE DETECTED Final  . Cocaine 03/05/2017 NONE DETECTED  NONE  DETECTED Final  . Benzodiazepines 03/05/2017 NONE DETECTED  NONE DETECTED Final  . Amphetamines 03/05/2017 NONE DETECTED  NONE DETECTED Final  . Tetrahydrocannabinol  03/05/2017 NONE DETECTED  NONE DETECTED Final  . Barbiturates 03/05/2017 NONE DETECTED  NONE DETECTED Final   Comment:        DRUG SCREEN FOR MEDICAL PURPOSES ONLY.  IF CONFIRMATION IS NEEDED FOR ANY PURPOSE, NOTIFY LAB WITHIN 5 DAYS.        LOWEST DETECTABLE LIMITS FOR URINE DRUG SCREEN Drug Class       Cutoff (ng/mL) Amphetamine      1000 Barbiturate      200 Benzodiazepine   793 Tricyclics       903 Opiates          300 Cocaine          300 THC              50   . Color, Urine 03/05/2017 YELLOW  YELLOW Final  . APPearance 03/05/2017 CLEAR  CLEAR Final  . Specific Gravity, Urine 03/05/2017 1.019  1.005 - 1.030 Final  . pH 03/05/2017 5.0  5.0 - 8.0 Final  . Glucose, UA 03/05/2017 NEGATIVE  NEGATIVE mg/dL Final  . Hgb urine dipstick 03/05/2017 SMALL* NEGATIVE Final  . Bilirubin Urine 03/05/2017 NEGATIVE  NEGATIVE Final  . Ketones, ur 03/05/2017 5* NEGATIVE mg/dL Final  . Protein, ur 03/05/2017 NEGATIVE  NEGATIVE mg/dL Final  . Nitrite 03/05/2017 NEGATIVE  NEGATIVE Final  . Leukocytes, UA 03/05/2017 TRACE* NEGATIVE Final  . RBC / HPF 03/05/2017 6-30  0 - 5 RBC/hpf Final  . WBC, UA 03/05/2017 0-5  0 - 5 WBC/hpf Final  . Bacteria, UA 03/05/2017 RARE* NONE SEEN Final  . Squamous Epithelial / LPF 03/05/2017 0-5* NONE SEEN Final  . Mucus 03/05/2017 PRESENT   Final    RADIOGRAPHIC STUDIES: I have personally reviewed the radiological images as listed and agree with the findings in the report  No results found.  ASSESSMENT:  Microcytic anemia secondary to severe iron deficiency from likely slow GI bleed from angiodysplastic lesions in colon. Ferritin 8.  PLAN: -Reviewed her labs with her. -Discussed giving her 2 doses of feraheme to boost up her iron levels and patient is agreeable. We will get it set up ASAP. -RTC in 2 months for follow up with labs to assess response to IV iron. Goal is to get correct her anemia and to get her ferritin above 100.  Orders Placed This  Encounter  Procedures  . CBC with Differential    Standing Status:   Future    Standing Expiration Date:   04/22/2018  . Comprehensive metabolic panel    Standing Status:   Future    Standing Expiration Date:   04/22/2018  . Iron and TIBC    Standing Status:   Future    Standing Expiration Date:   04/22/2018  . Ferritin    Standing Status:   Future    Standing Expiration Date:   04/22/2018  . Vitamin B12    Standing Status:   Future    Standing Expiration Date:   04/22/2018  . Folate    Standing Status:   Future    Standing Expiration Date:   04/22/2018  . Reticulocytes    Standing Status:   Future    Standing Expiration Date:   04/22/2018    All questions were answered. The patient knows to call the clinic with any problems, questions or concerns.  This  note was electronically signed.    Twana First, MD  04/22/2017 1:30 [facility] Follow up:    [place], [place] [id number]   DIAGNOSIS: Cancer Staging No matching staging information was found for the patient.  SUMMARY OF ONCOLOGIC HISTORY:  No history exists.    CURRENT THERAPY:  INTERVAL HISTORY: LAVERN CRIMI 77 y.o. female returns for    Patient Active Problem List   Diagnosis Date Noted  . Iron deficiency anemia due to chronic blood loss 04/22/2017  . History of colonic polyps 09/18/2016  . Carotid stenosis 04/02/2016    is allergic to augmentin [amoxicillin-pot clavulanate]; vicodin [hydrocodone-acetaminophen]; and quinolones.  MEDICAL HISTORY: Past Medical History:  Diagnosis Date  . Anemia   . Anxiety   . Arthritis   . Carotid artery occlusion   . Chronic kidney disease   . Colon polyps   . COPD (chronic obstructive pulmonary disease) ([place])   . Depression   . Dyspnea   . Heart murmur   . Hypertension   . Pneumonia   . PONV (postoperative nausea and vomiting)     SURGICAL HISTORY: Past Surgical History:  Procedure  Laterality Date  . CAROTID ENDARTERECTOMY    . CATARACT EXTRACTION W/PHACO Left 01/02/2014   Procedure: CATARACT EXTRACTION PHACO AND INTRAOCULAR LENS PLACEMENT (IOC);  Surgeon: Tonny Branch, MD;  Location: [facility];  Service: Ophthalmology;  Laterality: Left;  CDE 8.17  . CATARACT EXTRACTION W/PHACO Right 03/06/2014   Procedure: CATARACT EXTRACTION PHACO AND INTRAOCULAR LENS PLACEMENT ([place]);  Surgeon: Tonny Branch, MD;  Location: [facility];  Service: Ophthalmology;  Laterality: Right;  CDE:15.14  . CHOLECYSTECTOMY    . COLONOSCOPY  11/13/2011   Procedure: COLONOSCOPY;  Surgeon: Rogene Houston, MD;  [facility];  Service: Endoscopy;  Laterality: N/A;  1030  . COLONOSCOPY N/A 01/22/2017   Procedure: COLONOSCOPY;  Surgeon: Rogene Houston, MD;  [facility];  Service: Endoscopy;  Laterality: N/A;  1030  . ENDARTERECTOMY Left 04/02/2016   Procedure: ENDARTERECTOMY LEFT CAROTID ARTERY;  Surgeon: Rosetta Posner, MD;  Location: [place];  Service: Vascular;  Laterality: Left;  . PATCH ANGIOPLASTY Left 04/02/2016   Procedure: PATCH ANGIOPLASTY LEFT CAROTID ARTERY USING HEMASHIELD PLATINUM FINESS PATCH;  Surgeon: Rosetta Posner, MD;  Location: [facility];  Service: Vascular;  Laterality: Left;    SOCIAL HISTORY: Social History   Socioeconomic History  . Marital status: Widowed    Spouse name: Not on file  . Number of children: Not on file  . Years of education: Not on file  . Highest education level: Not on file  Social Needs  . Financial resource strain: Not on file  . Food insecurity - worry: Not on file  . Food insecurity - inability: Not on file  . Transportation needs - medical: Not on file  . Transportation needs - non-medical: Not on file  Occupational History  . Not on file  Tobacco Use  . Smoking status: Current Every Day Smoker    Packs/day: 1.00    Years: 50.00    Pack years: 50.00    Types: Cigarettes  . Smokeless tobacco: Never Used  Substance and Sexual Activity  .  Alcohol use: No  . Drug use: No  . Sexual activity: Not on file  Other Topics Concern  . Not on file  Social History Narrative  . Not on file    FAMILY HISTORY: Family History  Problem  Relation Age of Onset  . Heart murmur Mother   . Heart Problems Mother   . Emphysema Father   . Cancer Sister   . Dementia Brother   . Colon cancer Neg Hx     Review of Systems - Oncology    PHYSICAL EXAMINATION  ECOG PERFORMANCE STATUS: {CHL ONC ECOG VV:[id number]}  Vitals:   06/23/17 1507  BP: (!) 148/96  Pulse: 67  Resp: 18  Temp: 97.9 F (36.6 C)  SpO2: 97%    Physical Exam  LABORATORY DATA:  CBC    Component Value Date/Time   WBC 6.1 06/16/2017 1254   RBC 4.57 06/16/2017 1254   RBC 4.57 06/16/2017 1254   HGB 13.2 06/16/2017 1254   HCT 41.1 06/16/2017 1254   PLT 303 06/16/2017 1254   MCV 89.9 06/16/2017 1254   MCH 28.9 06/16/2017 1254   MCHC 32.1 06/16/2017 1254   RDW 18.0 (H) 06/16/2017 1254   LYMPHSABS 1.1 06/16/2017 1254   MONOABS 0.5 06/16/2017 1254   EOSABS 0.0 06/16/2017 1254   BASOSABS 0.1 06/16/2017 1254    CMP     Component Value Date/Time   NA 139 06/16/2017 1254   K 4.4 06/16/2017 1254   CL 103 06/16/2017 1254   CO2 22 06/16/2017 1254   GLUCOSE 105 (H) 06/16/2017 1254   BUN 18 06/16/2017 1254   CREATININE 0.90 06/16/2017 1254   CALCIUM 9.9 06/16/2017 1254   PROT 7.7 06/16/2017 1254   ALBUMIN 4.2 06/16/2017 1254   AST 22 06/16/2017 1254   ALT 17 06/16/2017 1254   ALKPHOS 70 06/16/2017 1254   BILITOT 0.5 06/16/2017 1254   GFRNONAA >60 06/16/2017 1254   GFRAA >60 06/16/2017 1254       PENDING LABS:   RADIOGRAPHIC STUDIES:  No results found.   PATHOLOGY:  Temp: 97.9 F (36.6 C) (01/29 1507) Temp Source: Oral (01/29 1507) BP: 148/96 (01/29 1507) Pulse Rate: 67 (01/29 1507)   ASSESSMENT and THERAPY PLAN:   No problem-specific Assessment & Plan notes found for this encounter.   Orders Placed This Encounter  Procedures  .  CBC with Differential/Platelet    Standing Status:   Future    Standing Expiration Date:   06/23/2018  . Comprehensive metabolic panel    Standing Status:   Future    Standing Expiration Date:   06/23/2018  . Lactate dehydrogenase    Standing Status:   Future    Standing Expiration Date:   06/23/2018  . Protein electrophoresis, serum    Standing Status:   Future    Standing Expiration Date:   06/23/2018  . Ferritin    Standing Status:   Future    Standing Expiration Date:   06/23/2018    All questions were answered. The patient knows to call the clinic with any problems, questions or concerns. We can certainly see the patient much sooner if necessary. This note was electronically signed. Zoila Shutter, MD 06/23/2017

## 2017-07-07 ENCOUNTER — Encounter: Payer: Self-pay | Admitting: Family

## 2017-07-07 ENCOUNTER — Ambulatory Visit (HOSPITAL_COMMUNITY)
Admission: RE | Admit: 2017-07-07 | Discharge: 2017-07-07 | Disposition: A | Discharge status: Home / Self Care | Payer: Medicare Other | Source: Ambulatory Visit | Attending: Vascular Surgery | Admitting: Vascular Surgery

## 2017-07-07 ENCOUNTER — Ambulatory Visit: Payer: Medicare Other | Admitting: Family

## 2017-07-07 VITALS — BP 146/70 | HR 65 | Resp 18 | Ht 61.5 in | Wt 93.5 lb

## 2017-07-07 DIAGNOSIS — I6523 Occlusion and stenosis of bilateral carotid arteries: Secondary | ICD-10-CM | POA: Diagnosis not present

## 2017-07-07 DIAGNOSIS — I6522 Occlusion and stenosis of left carotid artery: Secondary | ICD-10-CM

## 2017-07-07 DIAGNOSIS — Z9889 Other specified postprocedural states: Secondary | ICD-10-CM | POA: Diagnosis not present

## 2017-07-07 DIAGNOSIS — F17200 Nicotine dependence, unspecified, uncomplicated: Secondary | ICD-10-CM | POA: Diagnosis not present

## 2017-07-07 NOTE — Patient Instructions (Signed)
Steps to Quit Smoking Smoking tobacco can be bad for your health. It can also affect almost every organ in your body. Smoking puts you and people around you at risk for many serious long-lasting (chronic) diseases. Quitting smoking is hard, but it is one of the best things that you can do for your health. It is never too late to quit. What are the benefits of quitting smoking? When you quit smoking, you lower your risk for getting serious diseases and conditions. They can include:  Lung cancer or lung disease.  Heart disease.  Stroke.  Heart attack.  Not being able to have children (infertility).  Weak bones (osteoporosis) and broken bones (fractures).  If you have coughing, wheezing, and shortness of breath, those symptoms may get better when you quit. You may also get sick less often. If you are pregnant, quitting smoking can help to lower your chances of having a baby of low birth weight. What can I do to help me quit smoking? Talk with your doctor about what can help you quit smoking. Some things you can do (strategies) include:  Quitting smoking totally, instead of slowly cutting back how much you smoke over a period of time.  Going to in-person counseling. You are more likely to quit if you go to many counseling sessions.  Using resources and support systems, such as: ? Online chats with a counselor. ? Phone quitlines. ? Printed self-help materials. ? Support groups or group counseling. ? Text messaging programs. ? Mobile phone apps or applications.  Taking medicines. Some of these medicines may have nicotine in them. If you are pregnant or breastfeeding, do not take any medicines to quit smoking unless your doctor says it is okay. Talk with your doctor about counseling or other things that can help you.  Talk with your doctor about using more than one strategy at the same time, such as taking medicines while you are also going to in-person counseling. This can help make  quitting easier. What things can I do to make it easier to quit? Quitting smoking might feel very hard at first, but there is a lot that you can do to make it easier. Take these steps:  Talk to your family and friends. Ask them to support and encourage you.  Call phone quitlines, reach out to support groups, or work with a counselor.  Ask people who smoke to not smoke around you.  Avoid places that make you want (trigger) to smoke, such as: ? Bars. ? Parties. ? Smoke-break areas at work.  Spend time with people who do not smoke.  Lower the stress in your life. Stress can make you want to smoke. Try these things to help your stress: ? Getting regular exercise. ? Deep-breathing exercises. ? Yoga. ? Meditating. ? Doing a body scan. To do this, close your eyes, focus on one area of your body at a time from head to toe, and notice which parts of your body are tense. Try to relax the muscles in those areas.  Download or buy apps on your mobile phone or tablet that can help you stick to your quit plan. There are many free apps, such as QuitGuide from the CDC (Centers for Disease Control and Prevention). You can find more support from smokefree.gov and other websites.  This information is not intended to replace advice given to you by your health care provider. Make sure you discuss any questions you have with your health care provider. Document Released: 03/08/2009 Document   Revised: 01/08/2016 Document Reviewed: 09/26/2014 Elsevier Interactive Patient Education  2018 Elsevier Inc.     Stroke Prevention Some health problems and behaviors may make it more likely for you to have a stroke. Below are ways to lessen your risk of having a stroke.  Be active for at least 30 minutes on most or all days.  Do not smoke. Try not to be around others who smoke.  Do not drink too much alcohol. ? Do not have more than 2 drinks a day if you are a man. ? Do not have more than 1 drink a day if you  are a woman and are not pregnant.  Eat healthy foods, such as fruits and vegetables. If you were put on a specific diet, follow the diet as told.  Keep your cholesterol levels under control through diet and medicines. Look for foods that are low in saturated fat, trans fat, cholesterol, and are high in fiber.  If you have diabetes, follow all diet plans and take your medicine as told.  Ask your doctor if you need treatment to lower your blood pressure. If you have high blood pressure (hypertension), follow all diet plans and take your medicine as told by your doctor.  If you are 18-39 years old, have your blood pressure checked every 3-5 years. If you are age 40 or older, have your blood pressure checked every year.  Keep a healthy weight. Eat foods that are low in calories, salt, saturated fat, trans fat, and cholesterol.  Do not take drugs.  Avoid birth control pills, if this applies. Talk to your doctor about the risks of taking birth control pills.  Talk to your doctor if you have sleep problems (sleep apnea).  Take all medicine as told by your doctor. ? You may be told to take aspirin or blood thinner medicine. Take this medicine as told by your doctor. ? Understand your medicine instructions.  Make sure any other conditions you have are being taken care of.  Get help right away if:  You suddenly lose feeling (you feel numb) or have weakness in your face, arm, or leg.  Your face or eyelid hangs down to one side.  You suddenly feel confused.  You have trouble talking (aphasia) or understanding what people are saying.  You suddenly have trouble seeing in one or both eyes.  You suddenly have trouble walking.  You are dizzy.  You lose your balance or your movements are clumsy (uncoordinated).  You suddenly have a very bad headache and you do not know the cause.  You have new chest pain.  Your heart feels like it is fluttering or skipping a beat (irregular  heartbeat). Do not wait to see if the symptoms above go away. Get help right away. Call your local emergency services (911 in [place] Do not drive yourself to the hospital. This information is not intended to replace advice given to you by your health care provider. Make sure you discuss any questions you have with your health care provider. Document Released: 11/11/2011 Document Revised: 10/18/2015 Document Reviewed: 11/12/2012 Elsevier Interactive Patient Education  2018 Elsevier Inc.  

## 2017-07-07 NOTE — Progress Notes (Signed)
Chief Complaint: Follow up Extracranial Carotid Artery Stenosis   History of Present Illness  Sophia Coleman is a 77 y.o. female who is s/p left carotid endarterectomy on 04/02/2016 by Dr. Donnetta Hutching for severe asymptomatic disease.   She denies any known history of stroke or TIA. Specifically she denies a history of amaurosis fugax or monocular blindness, unilateral facial drooping, hemiplegia, or receptive or expressive aphasia.    Dr. Donnetta Hutching last evaluated pt on 11-04-16. At that time duplex revealed a patent endarterectomy with mild hyperplasia the distal patch and mid internal carotid artery. Velocities suggestive of 40-59% narrowing bilaterally. Dr. Donnetta Hutching discuss these findings with patient and her daughter present. She will notify should she develop any neurologic deficits. Otherwise we'll see [place] again in 6 months with repeat carotid duplex. She had normal pedal pulses do not suspect any peripheral vascular occlusive disease.   Pt reports occasional left hip pain that radiates to left foot; this happens when she is supine or sitting. Daughter states pt has known DDD.    Diabetic: no Tobacco use: smoker  (1-1/2 ppd, started at age 35 yrs)  Pt meds include: Statin : no ASA: yes Other anticoagulants/antiplatelets: no   Past Medical History:  Diagnosis Date  . Anemia   . Anxiety   . Arthritis   . Carotid artery occlusion   . Chronic kidney disease   . Colon polyps   . COPD (chronic obstructive pulmonary disease) ([place])   . Depression   . Dyspnea   . Heart murmur   . Hypertension   . Pneumonia   . PONV (postoperative nausea and vomiting)     Social History Social History   Tobacco Use  . Smoking status: Current Every Day Smoker    Packs/day: 1.00    Years: 50.00    Pack years: 50.00    Types: Cigarettes  . Smokeless tobacco: Never Used  Substance Use Topics  . Alcohol use: No  . Drug use: No    Family History Family History  Problem Relation Age of Onset  .  Heart murmur Mother   . Heart Problems Mother   . Heart disease Mother   . Emphysema Father   . Cancer Sister   . Dementia Brother   . Colon cancer Neg Hx     Surgical History Past Surgical History:  Procedure Laterality Date  . CAROTID ENDARTERECTOMY    . CATARACT EXTRACTION W/PHACO Left 01/02/2014   Procedure: CATARACT EXTRACTION PHACO AND INTRAOCULAR LENS PLACEMENT (IOC);  Surgeon: Tonny Branch, MD;  Location: [facility];  Service: Ophthalmology;  Laterality: Left;  CDE 8.17  . CATARACT EXTRACTION W/PHACO Right 03/06/2014   Procedure: CATARACT EXTRACTION PHACO AND INTRAOCULAR LENS PLACEMENT ([place]);  Surgeon: Tonny Branch, MD;  Location: [facility];  Service: Ophthalmology;  Laterality: Right;  CDE:15.14  . CHOLECYSTECTOMY    . COLONOSCOPY  11/13/2011   Procedure: COLONOSCOPY;  Surgeon: Rogene Houston, MD;  Location: [facility];  Service: Endoscopy;  Laterality: N/A;  1030  . COLONOSCOPY N/A 01/22/2017   Procedure: COLONOSCOPY;  Surgeon: Rogene Houston, MD;  Location: [facility];  Service: Endoscopy;  Laterality: N/A;  1030  . ENDARTERECTOMY Left 04/02/2016   Procedure: ENDARTERECTOMY LEFT CAROTID ARTERY;  Surgeon: Rosetta Posner, MD;  Location: [place];  Service: Vascular;  Laterality: Left;  . PATCH ANGIOPLASTY Left 04/02/2016   Procedure: PATCH ANGIOPLASTY LEFT CAROTID ARTERY USING HEMASHIELD PLATINUM FINESS PATCH;  Surgeon: Rosetta Posner, MD;  Location: [facility]  OR;  Service: Vascular;  Laterality: Left;    Allergies  Allergen Reactions  . Augmentin [Amoxicillin-Pot Clavulanate] Other (See Comments)    "SEVERE SWEATS" Has patient had a PCN reaction causing immediate rash, facial/tongue/throat swelling, SOB or lightheadedness with hypotension: No Has patient had a PCN reaction causing severe rash involving mucus membranes or skin necrosis: No Has patient had a PCN reaction that required hospitalization: No Has patient had a PCN reaction occurring within the last 10 years: Yes If all of the above  answers are "NO", then may proceed with Cephalosporin use.   . Vicodin [Hydrocodone-Acetaminophen] Nausea And Vomiting  . Quinolones Nausea And Vomiting    Current Outpatient Medications  Medication Sig Dispense Refill  . aspirin 81 MG tablet Take 81 mg by mouth daily.    Marland Kitchen ibuprofen (ADVIL,MOTRIN) 200 MG tablet Take 200-400 mg by mouth daily as needed for headache or moderate pain. For pain    . losartan (COZAAR) 100 MG tablet Take 100 mg by mouth daily.     Marland Kitchen PARoxetine (PAXIL) 40 MG tablet Take 40 mg by mouth every morning.     No current facility-administered medications for this visit.     Review of Systems : See HPI for pertinent positives and negatives.  Physical Examination  Vitals:   07/07/17 1257 07/07/17 1258  BP: (!) 148/72 (!) 146/70  Pulse: 65   Resp: 18   SpO2: 96%   Weight: 93 lb 8 oz (42.4 kg)   Height: 5' 1.5" (1.562 m)    Body mass index is 17.38 kg/m.  General: WDWN petite female in NAD GAIT: normal Eyes: PERRLA HENT: No gross abnormalities. Husky voice.  Pulmonary:  Respirations are non-labored, good air movement, CTAB, no rales, rhonchi, or wheezing. Cardiac: regular rhythm, no detected murmur.  VASCULAR EXAM Carotid Bruits Right Left   Negative Negative     Abdominal aortic pulse is not palpable. Radial pulses are 1+ palpable and equal.                                                                                                                            LE Pulses Right Left       POPLITEAL  not palpable   not palpable       POSTERIOR TIBIAL  not palpable   not palpable        DORSALIS PEDIS      ANTERIOR TIBIAL 1+ palpable  1+ palpable     Gastrointestinal: soft, nontender, BS WNL, no r/g, no palpable masses.  Musculoskeletal: No muscle atrophy/wasting. M/S 5/5 throughout, extremities without ischemic changes Skin: No rashes, no ulcers, no cellulitis. Excessive wrinkling.  Neurologic:  A&O X 3; appropriate affect, sensation is  normal; speech is normal, CN 2-12 intact, pain and light touch intact in extremities, motor exam as listed above. Psychiatric: Normal thought content, mood appropriate to clinical situation.      Assessment: Sophia Coleman is a 77 y.o.  female who is s/p left carotid endarterectomy on 04/02/2016.  She has no history of stroke or TIA.   Fortunately she does not have DM but unfortunately she continues to smoke 1/2-1 ppd, started at age 56.  She takes a daily 81 mg ASA, does not take a statin.   DATA Carotid Duplex (07/07/17): Right ICA: 1-39% stenosis Left ICA: (CEA site) 1-39% stenosis Unable to obtain the 40-59% stenosis bilaterally as in the exam on 11-04-16.  Both vertebral arteries with antegrade flow. Left subclavian artery waveforms are stenotic, right are normal.    Plan: Follow-up in 1 year with Carotid Duplex scan.   The patient was counseled re smoking cessation and given several free resources re smoking cessation.   I discussed in depth with the patient the nature of atherosclerosis, and emphasized the importance of maximal medical management including strict control of blood pressure, blood glucose, and lipid levels, obtaining regular exercise, and cessation of smoking.  The patient is aware that without maximal medical management the underlying atherosclerotic disease process will progress, limiting the benefit of any interventions. The patient was given information about stroke prevention and what symptoms should prompt the patient to seek immediate medical care. Thank you for allowing [place] to participate in this patient's care.  Clemon Chambers, RN, MSN, FNP-C [facility] Office: [phone]  Clinic Physician: Early  07/07/17 1:04 PM

## 2017-07-08 LAB — VAS US CAROTID
LEFT ECA DIAS: 0 cm/s
LEFT VERTEBRAL DIAS: 16 cm/s
Left CCA dist dias: 7 cm/s
Left CCA dist sys: 32 cm/s
Left CCA prox dias: 18 cm/s
Left CCA prox sys: 123 cm/s
Left ICA dist dias: -39 cm/s
Left ICA dist sys: -124 cm/s
Left ICA prox dias: 26 cm/s
Left ICA prox sys: 88 cm/s
RIGHT CCA MID DIAS: 12 cm/s
RIGHT ECA DIAS: -8 cm/s
RIGHT VERTEBRAL DIAS: 21 cm/s
Right CCA prox dias: 16 cm/s
Right CCA prox sys: 82 cm/s
Right cca dist sys: -128 cm/s

## 2017-09-22 DIAGNOSIS — F17290 Nicotine dependence, other tobacco product, uncomplicated: Secondary | ICD-10-CM | POA: Diagnosis not present

## 2017-09-22 DIAGNOSIS — Z681 Body mass index (BMI) 19 or less, adult: Secondary | ICD-10-CM | POA: Diagnosis not present

## 2017-09-22 DIAGNOSIS — S8002XA Contusion of left knee, initial encounter: Secondary | ICD-10-CM | POA: Diagnosis not present

## 2017-10-14 ENCOUNTER — Inpatient Hospital Stay (HOSPITAL_COMMUNITY): Payer: Medicare Other | Attending: Hematology

## 2017-10-14 DIAGNOSIS — D509 Iron deficiency anemia, unspecified: Secondary | ICD-10-CM | POA: Diagnosis not present

## 2017-10-14 DIAGNOSIS — Z720 Tobacco use: Secondary | ICD-10-CM | POA: Diagnosis not present

## 2017-10-14 DIAGNOSIS — D472 Monoclonal gammopathy: Secondary | ICD-10-CM | POA: Insufficient documentation

## 2017-10-14 DIAGNOSIS — K649 Unspecified hemorrhoids: Secondary | ICD-10-CM | POA: Diagnosis not present

## 2017-10-14 DIAGNOSIS — I10 Essential (primary) hypertension: Secondary | ICD-10-CM | POA: Insufficient documentation

## 2017-10-14 DIAGNOSIS — D500 Iron deficiency anemia secondary to blood loss (chronic): Secondary | ICD-10-CM

## 2017-10-14 LAB — COMPREHENSIVE METABOLIC PANEL
ALK PHOS: 68 U/L (ref 38–126)
ALT: 15 U/L (ref 14–54)
AST: 21 U/L (ref 15–41)
Albumin: 4.1 g/dL (ref 3.5–5.0)
Anion gap: 6 (ref 5–15)
BILIRUBIN TOTAL: 0.7 mg/dL (ref 0.3–1.2)
BUN: 17 mg/dL (ref 6–20)
CO2: 27 mmol/L (ref 22–32)
CREATININE: 0.78 mg/dL (ref 0.44–1.00)
Calcium: 9.9 mg/dL (ref 8.9–10.3)
Chloride: 104 mmol/L (ref 101–111)
GFR calc Af Amer: >60 mL/min (ref >60)
GFR calc non Af Amer: >60 mL/min (ref >60)
GLUCOSE: 101 mg/dL — AB (ref 65–99)
POTASSIUM: 4.3 mmol/L (ref 3.5–5.1)
Sodium: 137 mmol/L (ref 135–145)
TOTAL PROTEIN: 7.2 g/dL (ref 6.5–8.1)

## 2017-10-14 LAB — CBC WITH DIFFERENTIAL/PLATELET
BASOS ABS: 0 10*3/uL (ref 0.0–0.1)
Basophils Relative: 0 %
Eosinophils Absolute: 0.1 10*3/uL (ref 0.0–0.7)
Eosinophils Relative: 1 %
HEMATOCRIT: 40 % (ref 36.0–46.0)
HEMOGLOBIN: 13 g/dL (ref 12.0–15.0)
Lymphocytes Relative: 14 %
Lymphs Abs: 1 10*3/uL (ref 0.7–4.0)
MCH: 30.4 pg (ref 26.0–34.0)
MCHC: 32.5 g/dL (ref 30.0–36.0)
MCV: 93.5 fL (ref 78.0–100.0)
Monocytes Absolute: 0.8 10*3/uL (ref 0.1–1.0)
Monocytes Relative: 11 %
Neutro Abs: 5.2 10*3/uL (ref 1.7–7.7)
Neutrophils Relative %: 73 %
Platelets: 294 10*3/uL (ref 150–400)
RBC: 4.28 MIL/uL (ref 3.87–5.11)
RDW: 13.1 % (ref 11.5–15.5)
WBC: 7.1 10*3/uL (ref 4.0–10.5)

## 2017-10-14 LAB — LACTATE DEHYDROGENASE: LDH: 156 U/L (ref 98–192)

## 2017-10-14 LAB — FERRITIN: Ferritin: 17 ng/mL (ref 11–307)

## 2017-10-16 LAB — PROTEIN ELECTROPHORESIS, SERUM
A/G RATIO SPE: 1.2 (ref 0.7–1.7)
Albumin ELP: 3.8 g/dL (ref 2.9–4.4)
Alpha-1-Globulin: 0.3 g/dL (ref 0.0–0.4)
Alpha-2-Globulin: 0.8 g/dL (ref 0.4–1.0)
BETA GLOBULIN: 1 g/dL (ref 0.7–1.3)
GLOBULIN, TOTAL: 3.1 g/dL (ref 2.2–3.9)
Gamma Globulin: 1.1 g/dL (ref 0.4–1.8)
M-Spike, %: 0.7 g/dL — ABNORMAL HIGH
TOTAL PROTEIN ELP: 6.9 g/dL (ref 6.0–8.5)

## 2017-10-21 ENCOUNTER — Encounter (HOSPITAL_COMMUNITY): Payer: Self-pay | Admitting: Internal Medicine

## 2017-10-21 ENCOUNTER — Inpatient Hospital Stay (HOSPITAL_BASED_OUTPATIENT_CLINIC_OR_DEPARTMENT_OTHER): Payer: Medicare Other | Admitting: Internal Medicine

## 2017-10-21 VITALS — BP 148/59 | HR 88 | Temp 98.0°F | Resp 18 | Wt 91.2 lb

## 2017-10-21 DIAGNOSIS — D500 Iron deficiency anemia secondary to blood loss (chronic): Secondary | ICD-10-CM

## 2017-10-21 DIAGNOSIS — D509 Iron deficiency anemia, unspecified: Secondary | ICD-10-CM

## 2017-10-21 DIAGNOSIS — Z720 Tobacco use: Secondary | ICD-10-CM | POA: Diagnosis not present

## 2017-10-21 DIAGNOSIS — I10 Essential (primary) hypertension: Secondary | ICD-10-CM | POA: Diagnosis not present

## 2017-10-21 DIAGNOSIS — D472 Monoclonal gammopathy: Secondary | ICD-10-CM

## 2017-10-21 DIAGNOSIS — K649 Unspecified hemorrhoids: Secondary | ICD-10-CM | POA: Diagnosis not present

## 2017-10-21 NOTE — Patient Instructions (Signed)
[facility] at [facility]  Discharge Instructions:  You were seen by dr. higgs today.  _______________________________________________________________  Thank you for choosing [facility] at [facility] to provide your oncology and hematology care.  To afford each patient quality time with our providers, please arrive at least 15 minutes before your scheduled appointment.  You need to re-schedule your appointment if you arrive 10 or more minutes late.  We strive to give you quality time with our providers, and arriving late affects you and other patients whose appointments are after yours.  Also, if you no show three or more times for appointments you may be dismissed from the clinic.  Again, thank you for choosing [facility] at [facility]. Our hope is that these requests will allow you access to exceptional care and in a timely manner. _______________________________________________________________  If you have questions after your visit, please contact our office at [phone] between the hours of 8:30 a.m. and 5:00 p.m. Voicemails left after 4:30 p.m. will not be returned until the following business day. _______________________________________________________________  For prescription refill requests, have your pharmacy contact our office. _______________________________________________________________  Recommendations made by the consultant and any test results will be sent to your referring physician. _______________________________________________________________ 

## 2017-10-21 NOTE — Progress Notes (Addendum)
Diagnosis Iron deficiency anemia due to chronic blood loss - Plan: Kappa/lambda light chains, IgG, IgA, IgM, Beta 2 microglobulin, serum, Immunofixation electrophoresis, DG Bone Survey Met, Ambulatory Referral for Lung Cancer Screening, CBC with Differential/Platelet, Comprehensive metabolic panel, Lactate dehydrogenase, Ferritin  Monoclonal gammopathy - Plan: Kappa/lambda light chains, IgG, IgA, IgM, Beta 2 microglobulin, serum, Immunofixation electrophoresis, DG Bone Survey Met, Ambulatory Referral for Lung Cancer Screening, CBC with Differential/Platelet, Comprehensive metabolic panel, Lactate dehydrogenase, Ferritin  Staging Cancer Staging No matching staging information was found for the patient.  Assessment and Plan:  1.  Iron Deficiency anemia.  The patient was treated with IV iron 04/2017.  Labs done 10/14/2017 reviewed with pt and show WBC 7.1 hb 13 and plts 294,000.  Ferritin is 17.  She denies any blood in stool or urine.  She is given the option of IV iron with Injectafer 750 mg IV D1 and D8.  She will RTC in July 2019 for follow-up and repeat labs after IV iron.  She should follow-up with GI as recommended.    2.  Monoclonal gammopathy.  Pt had SPEP done 10/14/2017 that showed a m spike measuring 0.7 g/dl.  Will check quantitative IG, kappa/lambda ratio, B2M, IFIX.  She will also be set up for skeletal survey.  She has no evidence of anemia, RF, elevated Calcium.  She was given written information regarding MGUS which is suspected.  She will follow-up to go over studies.    3.  Hemorrhoids/angiodysplastic lesion.  This was noted on colonoscopy done in August 2018.  She should follow-up with GI as recommended.  4.  Hypertension.  Blood pressure is 148/59.  Follow-up with primary care physician as recommended.  5.  Smoking.  Cessation is recommended.  She is referred to lung cancer screening program.    Interval History:  77 y.o. Female seen initially by Dr. Leata Mouse for evaluation of  iron deficiency anemia.  She was treated with IV iron.   She had a colonoscopy on 01/22/17 which demonstrated five non-bleeding colonic angiodysplastic lesions, diverticulosis in the sigmoid colon, external hemorrhoids.   Current status:  Patient is seen today for follow-up.  She is here to go over labs.    Problem List Patient Active Problem List   Diagnosis Date Noted  . Iron deficiency anemia due to chronic blood loss [D50.0] 04/22/2017  . History of colonic polyps [Z86.010] 09/18/2016  . Carotid stenosis [I65.29] 04/02/2016    Past Medical History Past Medical History:  Diagnosis Date  . Anemia   . Anxiety   . Arthritis   . Carotid artery occlusion   . Chronic kidney disease   . Colon polyps   . COPD (chronic obstructive pulmonary disease) ([place])   . Depression   . Dyspnea   . Heart murmur   . Hypertension   . Pneumonia   . PONV (postoperative nausea and vomiting)     Past Surgical History Past Surgical History:  Procedure Laterality Date  . CAROTID ENDARTERECTOMY    . CATARACT EXTRACTION W/PHACO Left 01/02/2014   Procedure: CATARACT EXTRACTION PHACO AND INTRAOCULAR LENS PLACEMENT (IOC);  Surgeon: Tonny Branch, MD;  Location: [facility];  Service: Ophthalmology;  Laterality: Left;  CDE 8.17  . CATARACT EXTRACTION W/PHACO Right 03/06/2014   Procedure: CATARACT EXTRACTION PHACO AND INTRAOCULAR LENS PLACEMENT ([place]);  Surgeon: Tonny Branch, MD;  Location: [facility];  Service: Ophthalmology;  Laterality: Right;  CDE:15.14  . CHOLECYSTECTOMY    . COLONOSCOPY  11/13/2011   Procedure: COLONOSCOPY;  Surgeon: Rogene Houston, MD;  Location: [facility];  Service: Endoscopy;  Laterality: N/A;  1030  . COLONOSCOPY N/A 01/22/2017   Procedure: COLONOSCOPY;  Surgeon: Rogene Houston, MD;  [facility];  Service: Endoscopy;  Laterality: N/A;  1030  . ENDARTERECTOMY Left 04/02/2016   Procedure: ENDARTERECTOMY LEFT CAROTID ARTERY;  Surgeon: Rosetta Posner, MD;  Location: [facility];  Service:  Vascular;  Laterality: Left;  . PATCH ANGIOPLASTY Left 04/02/2016   Procedure: PATCH ANGIOPLASTY LEFT CAROTID ARTERY USING HEMASHIELD PLATINUM FINESS PATCH;  Surgeon: Rosetta Posner, MD;  Location: [facility] OR;  Service: Vascular;  Laterality: Left;    Family History Family History  Problem Relation Age of Onset  . Heart murmur Mother   . Heart Problems Mother   . Heart disease Mother   . Emphysema Father   . Cancer Sister   . Dementia Brother   . Colon cancer Neg Hx      Social History  reports that she has been smoking cigarettes.  She has a 50.00 pack-year smoking history. She has never used smokeless tobacco. She reports that she does not drink alcohol or use drugs.  Medications  Current Outpatient Medications:  .  aspirin 81 MG tablet, Take 81 mg by mouth daily., Disp: , Rfl:  .  ibuprofen (ADVIL,MOTRIN) 200 MG tablet, Take 200-400 mg by mouth daily as needed for headache or moderate pain. For pain, Disp: , Rfl:  .  losartan (COZAAR) 100 MG tablet, Take 100 mg by mouth daily. , Disp: , Rfl:  .  PARoxetine (PAXIL) 40 MG tablet, Take 40 mg by mouth every morning., Disp: , Rfl:   Allergies Augmentin [amoxicillin-pot clavulanate]; Vicodin [hydrocodone-acetaminophen]; and Quinolones  Review of Systems Review of Systems - Oncology ROS as per HPI otherwise 12 point ROS is negative.   Physical Exam  Vitals Wt Readings from Last 3 Encounters:  10/21/17 91 lb 3.2 oz (41.4 kg)  07/07/17 93 lb 8 oz (42.4 kg)  06/23/17 93 lb 8 oz (42.4 kg)   Temp Readings from Last 3 Encounters:  10/21/17 98 F (36.7 C) (Oral)  06/23/17 97.9 F (36.6 C) (Oral)  04/30/17 97.9 F (36.6 C) (Oral)   BP Readings from Last 3 Encounters:  10/21/17 (!) 148/59  07/07/17 (!) 146/70  06/23/17 (!) 148/96   Pulse Readings from Last 3 Encounters:  10/21/17 88  07/07/17 65  06/23/17 67    Constitutional: Well-developed, well-nourished, and in no distress.   HENT: Head: Normocephalic and atraumatic.   Mouth/Throat: No oropharyngeal exudate. Mucosa moist. Eyes: Pupils are equal, round, and reactive to light. Conjunctivae are normal. No scleral icterus.  Neck: Normal range of motion. Neck supple. No JVD present.  Cardiovascular: Normal rate, regular rhythm and normal heart sounds.  Exam reveals no gallop and no friction rub.   No murmur heard. Pulmonary/Chest: Effort normal and breath sounds normal. No respiratory distress. No wheezes.No rales.  Abdominal: Soft. Bowel sounds are normal. No distension. There is no tenderness. There is no guarding.  Musculoskeletal: No edema or tenderness.  Lymphadenopathy: No cervical, axillary or supraclavicular adenopathy.  Neurological: Alert and oriented to person, place, and time. No cranial nerve deficit.  Skin: Skin is warm and dry. No rash noted. No erythema. No pallor.  Psychiatric: Affect and judgment normal.   Labs No visits with results within 3 Day(s) from this visit.  Latest known visit with results is:  Appointment on 10/14/2017  Component Date Value Ref Range  Status  . WBC 10/14/2017 7.1  4.0 - 10.5 K/uL Final  . RBC 10/14/2017 4.28  3.87 - 5.11 MIL/uL Final  . Hemoglobin 10/14/2017 13.0  12.0 - 15.0 g/dL Final  . HCT 10/14/2017 40.0  36.0 - 46.0 % Final  . MCV 10/14/2017 93.5  78.0 - 100.0 fL Final  . MCH 10/14/2017 30.4  26.0 - 34.0 pg Final  . MCHC 10/14/2017 32.5  30.0 - 36.0 g/dL Final  . RDW 10/14/2017 13.1  11.5 - 15.5 % Final  . Platelets 10/14/2017 294  150 - 400 K/uL Final  . Neutrophils Relative % 10/14/2017 73  % Final  . Neutro Abs 10/14/2017 5.2  1.7 - 7.7 K/uL Final  . Lymphocytes Relative 10/14/2017 14  % Final  . Lymphs Abs 10/14/2017 1.0  0.7 - 4.0 K/uL Final  . Monocytes Relative 10/14/2017 11  % Final  . Monocytes Absolute 10/14/2017 0.8  0.1 - 1.0 K/uL Final  . Eosinophils Relative 10/14/2017 1  % Final  . Eosinophils Absolute 10/14/2017 0.1  0.0 - 0.7 K/uL Final  . Basophils Relative 10/14/2017 0  % Final  .  Basophils Absolute 10/14/2017 0.0  0.0 - 0.1 K/uL Final   Performed at [facility], [address], [place], [place] [address]  . Sodium 10/14/2017 137  135 - 145 mmol/L Final  . Potassium 10/14/2017 4.3  3.5 - 5.1 mmol/L Final  . Chloride 10/14/2017 104  101 - 111 mmol/L Final  . CO2 10/14/2017 27  22 - 32 mmol/L Final  . Glucose, Bld 10/14/2017 101* 65 - 99 mg/dL Final  . BUN 10/14/2017 17  6 - 20 mg/dL Final  . Creatinine, Ser 10/14/2017 0.78  0.44 - 1.00 mg/dL Final  . Calcium 10/14/2017 9.9  8.9 - 10.3 mg/dL Final  . Total Protein 10/14/2017 7.2  6.5 - 8.1 g/dL Final  . Albumin 10/14/2017 4.1  3.5 - 5.0 g/dL Final  . AST 10/14/2017 21  15 - 41 U/L Final  . ALT 10/14/2017 15  14 - 54 U/L Final  . Alkaline Phosphatase 10/14/2017 68  38 - 126 U/L Final  . Total Bilirubin 10/14/2017 0.7  0.3 - 1.2 mg/dL Final  . GFR calc non Af Amer 10/14/2017 >60  >60 mL/min Final  . GFR calc Af Amer 10/14/2017 >60  >60 mL/min Final   Comment: (NOTE) The eGFR has been calculated using the CKD EPI equation. This calculation has not been validated in all clinical situations. eGFR's persistently <60 mL/min signify possible Chronic Kidney Disease.   Georgiann Hahn gap 10/14/2017 6  5 - 15 Final   Performed at [facility], [address], [place], [place] [address]  . LDH 10/14/2017 156  98 - 192 U/L Final   Performed at [facility], [address], [place], [place] [address]  . Total Protein ELP 10/14/2017 6.9  6.0 - 8.5 g/dL Final  . Albumin ELP 10/14/2017 3.8  2.9 - 4.4 g/dL Final  . Alpha-1-Globulin 10/14/2017 0.3  0.0 - 0.4 g/dL Final  . Alpha-2-Globulin 10/14/2017 0.8  0.4 - 1.0 g/dL Final  . Beta Globulin 10/14/2017 1.0  0.7 - 1.3 g/dL Final  . Gamma Globulin 10/14/2017 1.1  0.4 - 1.8 g/dL Final  . M-Spike, % 10/14/2017 0.7* Not Observed g/dL Final  . SPE Interp. 10/14/2017 Comment   Final   Comment: (NOTE) The SPE pattern demonstrates a single peak (M-spike) in the gamma region which may represent  monoclonal protein. This peak may  also be caused by circulating immune complexes, cryoglobulins, C-reactive protein, fibrinogen or hemolysis.  If clinically indicated, the presence of a monoclonal gammopathy may be confirmed by immuno- fixation, as well as an evaluation of the urine for the presence of Bence-Jones protein. Performed At: [facility] [place], [place] [id number] Rush Farmer MD [id number]   . Comment 10/14/2017 Comment   Final   Comment: (NOTE) Protein electrophoresis scan will follow via computer, mail, or courier delivery.   Marland Kitchen GLOBULIN, TOTAL 10/14/2017 3.1  2.2 - 3.9 g/dL Corrected  . A/G Ratio 10/14/2017 1.2  0.7 - 1.7 Corrected   Performed at [facility], [address] [place], [place] [address]  . Ferritin 10/14/2017 17  11 - 307 ng/mL Final   Performed at [facility], [place] [address], [place], [place] [address]     Pathology Orders Placed This Encounter  Procedures  . DG Bone Survey Met    Standing Status:   Future    Standing Expiration Date:   12/22/2018    Order Specific Question:   Reason for Exam (SYMPTOM  OR DIAGNOSIS REQUIRED)    Answer:   monoclonal gammopathy    Order Specific Question:   Preferred imaging location?    Answer:   [facility]    Order Specific Question:   Radiology Contrast Protocol - do NOT remove file path    Answer:   [email or web address]  . Kappa/lambda light chains    Standing Status:   Future    Standing Expiration Date:   10/22/2018  . IgG, IgA, IgM    Standing Status:   Future    Standing Expiration Date:   10/22/2018  . Beta 2 microglobulin, serum    Standing Status:   Future    Standing Expiration Date:   10/22/2018  . Immunofixation electrophoresis    Standing Status:   Future    Standing Expiration Date:   10/22/2018  . CBC with Differential/Platelet    Standing Status:   Future    Standing Expiration Date:   10/22/2018  . Comprehensive  metabolic panel    Standing Status:   Future    Standing Expiration Date:   10/22/2018  . Lactate dehydrogenase    Standing Status:   Future    Standing Expiration Date:   10/22/2018  . Ferritin    Standing Status:   Future    Standing Expiration Date:   10/22/2018  . Ambulatory Referral for Lung Cancer Screening    Referral Priority:   Routine    Referral Type:   Consultation    Referral Reason:   Specialty Services Required    Number of Visits Requested:   [place] MD

## 2017-10-28 ENCOUNTER — Inpatient Hospital Stay (HOSPITAL_COMMUNITY): Payer: Medicare Other

## 2017-10-28 ENCOUNTER — Encounter (HOSPITAL_COMMUNITY): Payer: Self-pay

## 2017-10-28 ENCOUNTER — Inpatient Hospital Stay (HOSPITAL_COMMUNITY): Payer: Medicare Other | Attending: Hematology

## 2017-10-28 VITALS — BP 145/58 | HR 72 | Temp 97.6°F | Resp 18

## 2017-10-28 DIAGNOSIS — D500 Iron deficiency anemia secondary to blood loss (chronic): Secondary | ICD-10-CM

## 2017-10-28 DIAGNOSIS — D472 Monoclonal gammopathy: Secondary | ICD-10-CM

## 2017-10-28 DIAGNOSIS — D509 Iron deficiency anemia, unspecified: Secondary | ICD-10-CM | POA: Diagnosis not present

## 2017-10-28 MED ORDER — SODIUM CHLORIDE 0.9 % IV SOLN
750.0000 mg | Freq: Once | INTRAVENOUS | Status: AC
  Administered 2017-10-28: 750 mg via INTRAVENOUS
  Filled 2017-10-28: qty 15

## 2017-10-28 MED ORDER — SODIUM CHLORIDE 0.9 % IV SOLN
INTRAVENOUS | Status: DC
  Administered 2017-10-28: 14:00:00 via INTRAVENOUS

## 2017-10-28 NOTE — Patient Instructions (Signed)
[facility] at [facility] Discharge Instructions  Received Injectafer infusion today. Follow-up as scheduled. Call clinic for any questions or concerns   Thank you for choosing [facility] at [facility] to provide your oncology and hematology care.  To afford each patient quality time with our provider, please arrive at least 15 minutes before your scheduled appointment time.   If you have a lab appointment with the Cancer Center please come in thru the  Main Entrance and check in at the main information desk  You need to re-schedule your appointment should you arrive 10 or more minutes late.  We strive to give you quality time with our providers, and arriving late affects you and other patients whose appointments are after yours.  Also, if you no show three or more times for appointments you may be dismissed from the clinic at the providers discretion.     Again, thank you for choosing [facility].  Our hope is that these requests will decrease the amount of time that you wait before being seen by our physicians.       _____________________________________________________________  Should you have questions after your visit to  Cancer Center, please contact our office at [phone] between the hours of 8:30 a.m. and 4:30 p.m.  Voicemails left after 4:30 p.m. will not be returned until the following business day.  For prescription refill requests, have your pharmacy contact our office.       Resources For Cancer Patients and their Caregivers ? American Cancer Society: Can assist with transportation, wigs, general needs, runs Look Good Feel Better.        1-888-227-6333 ? Cancer Care: Provides financial assistance, online support groups, medication/co-pay assistance.  1-800-813-HOPE (4673) ? [facility] Assists [place] cancer patients and their families through emotional , educational and  financial support.  336-427-4357 ? [place] DSS Where to apply for food stamps, Medicaid and utility assistance. 336-342-1394 ? [facility]: Transportation to medical appointments. 336-347-2287 ? Social Security Administration: May apply for disability if have a Stage IV cancer. 336-342-7796 1-800-772-1213 ? [place] Aging, Disability and Transit Services: Assists with nutrition, care and transit needs. 336-349-2343  Cancer Center Support Programs:   > Cancer Support Group  2nd Tuesday of the month 1pm-2pm, Journey Room   > Creative Journey  3rd Tuesday of the month 1130am-1pm, Journey Room    

## 2017-10-28 NOTE — Progress Notes (Signed)
Sophia Coleman tolerated Injectafer infusion well without complaints or incident. VSS upon discharge. Pt discharged self ambulatory in satisfactory condition

## 2017-10-29 LAB — BETA 2 MICROGLOBULIN, SERUM: BETA 2 MICROGLOBULIN: 2 mg/L (ref 0.6–2.4)

## 2017-10-29 LAB — KAPPA/LAMBDA LIGHT CHAINS
KAPPA FREE LGHT CHN: 17.7 mg/L (ref 3.3–19.4)
KAPPA, LAMDA LIGHT CHAIN RATIO: 0.73 (ref 0.26–1.65)
Lambda free light chains: 24.4 mg/L (ref 5.7–26.3)

## 2017-10-29 LAB — IGG, IGA, IGM
IGA: 123 mg/dL (ref 64–422)
IGG (IMMUNOGLOBIN G), SERUM: 1100 mg/dL (ref 700–1600)
IgM (Immunoglobulin M), Srm: 85 mg/dL (ref 26–217)

## 2017-10-30 LAB — IMMUNOFIXATION ELECTROPHORESIS
IGA: 123 mg/dL (ref 64–422)
IGM (IMMUNOGLOBULIN M), SRM: 81 mg/dL (ref 26–217)
IgG (Immunoglobin G), Serum: 1116 mg/dL (ref 700–1600)
Total Protein ELP: 6.8 g/dL (ref 6.0–8.5)

## 2017-11-04 ENCOUNTER — Inpatient Hospital Stay (HOSPITAL_COMMUNITY): Payer: Medicare Other

## 2017-11-04 ENCOUNTER — Encounter (HOSPITAL_COMMUNITY): Payer: Self-pay

## 2017-11-04 VITALS — BP 158/61 | HR 66 | Temp 97.6°F | Resp 18

## 2017-11-04 DIAGNOSIS — D500 Iron deficiency anemia secondary to blood loss (chronic): Secondary | ICD-10-CM

## 2017-11-04 DIAGNOSIS — D509 Iron deficiency anemia, unspecified: Secondary | ICD-10-CM | POA: Diagnosis not present

## 2017-11-04 MED ORDER — SODIUM CHLORIDE 0.9 % IV SOLN
750.0000 mg | Freq: Once | INTRAVENOUS | Status: AC
  Administered 2017-11-04: 750 mg via INTRAVENOUS
  Filled 2017-11-04: qty 15

## 2017-11-04 MED ORDER — SODIUM CHLORIDE 0.9 % IV SOLN
INTRAVENOUS | Status: DC
  Administered 2017-11-04: 15:00:00 via INTRAVENOUS

## 2017-11-04 NOTE — Patient Instructions (Signed)
[facility] at [facility] Discharge Instructions  Injectafer given today Follow up as scheduled.   Thank you for choosing [facility] at [facility] to provide your oncology and hematology care.  To afford each patient quality time with our provider, please arrive at least 15 minutes before your scheduled appointment time.   If you have a lab appointment with the Cancer Center please come in thru the  Main Entrance and check in at the main information desk  You need to re-schedule your appointment should you arrive 10 or more minutes late.  We strive to give you quality time with our providers, and arriving late affects you and other patients whose appointments are after yours.  Also, if you no show three or more times for appointments you may be dismissed from the clinic at the providers discretion.     Again, thank you for choosing [facility].  Our hope is that these requests will decrease the amount of time that you wait before being seen by our physicians.       _____________________________________________________________  Should you have questions after your visit to [facility], please contact our office at [phone] between the hours of 8:30 a.m. and 4:30 p.m.  Voicemails left after 4:30 p.m. will not be returned until the following business day.  For prescription refill requests, have your pharmacy contact our office.       Resources For Cancer Patients and their Caregivers ? American Cancer Society: Can assist with transportation, wigs, general needs, runs Look Good Feel Better.        1-888-227-6333 ? Cancer Care: Provides financial assistance, online support groups, medication/co-pay assistance.  1-800-813-HOPE (4673) ? [facility] Assists [place] cancer patients and their families through emotional , educational and financial support.  336-427-4357 ? [place]  DSS Where to apply for food stamps, Medicaid and utility assistance. 336-342-1394 ? RCATS: Transportation to medical appointments. 336-347-2287 ? Social Security Administration: May apply for disability if have a Stage IV cancer. 336-342-7796 1-800-772-1213 ? [place] Aging, Disability and Transit Services: Assists with nutrition, care and transit needs. 336-349-2343  Cancer Center Support Programs:   > Cancer Support Group  2nd Tuesday of the month 1pm-2pm, Journey Room   > Creative Journey  3rd Tuesday of the month 1130am-1pm, Journey Room    

## 2017-11-04 NOTE — Progress Notes (Signed)
Injectafer given per orders. Patient tolerated it well without problems. Vitals stable and discharged home from clinic ambulatory. Follow up as scheduled.  

## 2017-12-02 ENCOUNTER — Inpatient Hospital Stay (HOSPITAL_COMMUNITY): Payer: Medicare Other | Attending: Hematology

## 2017-12-02 DIAGNOSIS — D472 Monoclonal gammopathy: Secondary | ICD-10-CM | POA: Insufficient documentation

## 2017-12-02 DIAGNOSIS — R911 Solitary pulmonary nodule: Secondary | ICD-10-CM | POA: Diagnosis not present

## 2017-12-02 DIAGNOSIS — I10 Essential (primary) hypertension: Secondary | ICD-10-CM | POA: Insufficient documentation

## 2017-12-02 DIAGNOSIS — Z720 Tobacco use: Secondary | ICD-10-CM | POA: Insufficient documentation

## 2017-12-02 DIAGNOSIS — D500 Iron deficiency anemia secondary to blood loss (chronic): Secondary | ICD-10-CM

## 2017-12-02 DIAGNOSIS — D509 Iron deficiency anemia, unspecified: Secondary | ICD-10-CM | POA: Diagnosis not present

## 2017-12-02 LAB — CBC WITH DIFFERENTIAL/PLATELET
Basophils Absolute: 0 10*3/uL (ref 0.0–0.1)
Basophils Relative: 0 %
EOS ABS: 0 10*3/uL (ref 0.0–0.7)
EOS PCT: 1 %
HEMATOCRIT: 40.5 % (ref 36.0–46.0)
Hemoglobin: 13.6 g/dL (ref 12.0–15.0)
Lymphocytes Relative: 13 %
Lymphs Abs: 0.9 10*3/uL (ref 0.7–4.0)
MCH: 31.6 pg (ref 26.0–34.0)
MCHC: 33.6 g/dL (ref 30.0–36.0)
MCV: 94 fL (ref 78.0–100.0)
MONO ABS: 0.7 10*3/uL (ref 0.1–1.0)
MONOS PCT: 10 %
Neutro Abs: 5.2 10*3/uL (ref 1.7–7.7)
Neutrophils Relative %: 76 %
PLATELETS: 270 10*3/uL (ref 150–400)
RBC: 4.31 MIL/uL (ref 3.87–5.11)
RDW: 13.6 % (ref 11.5–15.5)
WBC: 6.9 10*3/uL (ref 4.0–10.5)

## 2017-12-02 LAB — COMPREHENSIVE METABOLIC PANEL
ALT: 18 U/L (ref 0–44)
ANION GAP: 6 (ref 5–15)
AST: 24 U/L (ref 15–41)
Albumin: 4.1 g/dL (ref 3.5–5.0)
Alkaline Phosphatase: 75 U/L (ref 38–126)
BILIRUBIN TOTAL: 0.6 mg/dL (ref 0.3–1.2)
BUN: 20 mg/dL (ref 8–23)
CHLORIDE: 107 mmol/L (ref 98–111)
CO2: 24 mmol/L (ref 22–32)
Calcium: 9.3 mg/dL (ref 8.9–10.3)
Creatinine, Ser: 0.8 mg/dL (ref 0.44–1.00)
GFR calc Af Amer: >60 mL/min (ref >60)
GFR calc non Af Amer: >60 mL/min (ref >60)
GLUCOSE: 100 mg/dL — AB (ref 70–99)
POTASSIUM: 4.3 mmol/L (ref 3.5–5.1)
Sodium: 137 mmol/L (ref 135–145)
TOTAL PROTEIN: 7.3 g/dL (ref 6.5–8.1)

## 2017-12-02 LAB — FERRITIN: Ferritin: 440 ng/mL — ABNORMAL HIGH (ref 11–307)

## 2017-12-02 LAB — LACTATE DEHYDROGENASE: LDH: 148 U/L (ref 98–192)

## 2017-12-09 ENCOUNTER — Inpatient Hospital Stay (HOSPITAL_BASED_OUTPATIENT_CLINIC_OR_DEPARTMENT_OTHER): Payer: Medicare Other | Admitting: Internal Medicine

## 2017-12-09 ENCOUNTER — Encounter (HOSPITAL_COMMUNITY): Payer: Self-pay | Admitting: Internal Medicine

## 2017-12-09 VITALS — BP 140/62 | HR 73 | Temp 98.5°F | Resp 16 | Wt 91.5 lb

## 2017-12-09 DIAGNOSIS — D509 Iron deficiency anemia, unspecified: Secondary | ICD-10-CM | POA: Diagnosis not present

## 2017-12-09 DIAGNOSIS — R911 Solitary pulmonary nodule: Secondary | ICD-10-CM

## 2017-12-09 DIAGNOSIS — D472 Monoclonal gammopathy: Secondary | ICD-10-CM | POA: Diagnosis not present

## 2017-12-09 DIAGNOSIS — I10 Essential (primary) hypertension: Secondary | ICD-10-CM | POA: Diagnosis not present

## 2017-12-09 DIAGNOSIS — Z720 Tobacco use: Secondary | ICD-10-CM

## 2017-12-09 DIAGNOSIS — D500 Iron deficiency anemia secondary to blood loss (chronic): Secondary | ICD-10-CM

## 2017-12-09 NOTE — Patient Instructions (Signed)
[facility] at [facility] Discharge Instructions  You saw Dr. Higgs today.   Thank you for choosing [facility] at [facility] to provide your oncology and hematology care.  To afford each patient quality time with our provider, please arrive at least 15 minutes before your scheduled appointment time.   If you have a lab appointment with the Cancer Center please come in thru the  Main Entrance and check in at the main information desk  You need to re-schedule your appointment should you arrive 10 or more minutes late.  We strive to give you quality time with our providers, and arriving late affects you and other patients whose appointments are after yours.  Also, if you no show three or more times for appointments you may be dismissed from the clinic at the providers discretion.     Again, thank you for choosing [facility].  Our hope is that these requests will decrease the amount of time that you wait before being seen by our physicians.       _____________________________________________________________  Should you have questions after your visit to [facility], please contact our office at [phone] between the hours of 8:30 a.m. and 4:30 p.m.  Voicemails left after 4:30 p.m. will not be returned until the following business day.  For prescription refill requests, have your pharmacy contact our office.       Resources For Cancer Patients and their Caregivers ? American Cancer Society: Can assist with transportation, wigs, general needs, runs Look Good Feel Better.        1-888-227-6333 ? Cancer Care: Provides financial assistance, online support groups, medication/co-pay assistance.  1-800-813-HOPE (4673) ? [facility] Assists [place] cancer patients and their families through emotional , educational and financial support.  336-427-4357 ? [place] DSS Where to apply for food  stamps, Medicaid and utility assistance. 336-342-1394 ? RC[facility]: Transportation to medical appointments. 336-347-2287 ? Social Security Administration: May apply for disability if have a Stage IV cancer. 336-342-7796 1-800-772-1213 ? [place] Aging, Disability and Transit Services: Assists with nutrition, care and transit needs. 336-349-2343  Cancer Center Support Programs:   > Cancer Support Group  2nd Tuesday of the month 1pm-2pm, Journey Room   > Creative Journey  3rd Tuesday of the month 1130am-1pm, Journey Room     

## 2017-12-09 NOTE — Progress Notes (Signed)
Diagnosis Iron deficiency anemia due to chronic blood loss - Plan: CBC with Differential/Platelet, Comprehensive metabolic panel, Lactate dehydrogenase, Protein electrophoresis, serum, Ferritin, Kappa/lambda light chains, IgG, IgA, IgM, Beta 2 microglobulin, serum  Monoclonal gammopathy - Plan: CBC with Differential/Platelet, Comprehensive metabolic panel, Lactate dehydrogenase, Protein electrophoresis, serum, Ferritin, Kappa/lambda light chains, IgG, IgA, IgM, Beta 2 microglobulin, serum  Staging Cancer Staging No matching staging information was found for the patient.  Assessment and Plan:  1.  Iron Deficiency anemia.  The patient was last treated with IV iron on 11/04/2017.  Labs done 12/02/2017 reviewed with pt and showed WBC 6.9 HB 13.6 plts 270,000.  Ferritin is 440.  Pt will RTC in 03/2018 for follow-up and repeat labs.   Pt had evidence of hemorrhoids and angiodysplastic lesion noted on colonoscopy done in August 2018.  Pt should follow-up with GI as recommended.    2.  Monoclonal gammopathy.  Pt had SPEP done 10/14/2017 that showed a m spike measuring 0.7 g/dl.  She has normal quantitative IG, kappa/lambda ratio, B2M.  She was previously recommended for skeletal survey.  Cr is 0.80, Calcium WNL at 9.3.  She will have repeat myeloma labs in 03/2018.  If any change in labs she will be recommended for additional work-up.    3. Hypertension.  Blood pressure is 140/62.  Follow-up with PCP.    5.  Smoking.  Pt reports she has periodic imaging done by Dr. Golden Circle.  She had CT scan of chest done 05/01/2017 that showed IMPRESSION: Stable right lower lobe nodule. Given the nodular stability future CT at 12 months (from today's scan) is considered optional for low-risk patients, but is recommended for high-risk patients. This recommendation follows the consensus statement: Guidelines for Management of Incidental Pulmonary Nodules Detected on CT Images: From the Fleischner Society 2017; Radiology  2017; 284:228-243.  Resolution of previously seen left lower lobe infiltrative change. New patchy infiltrate is noted within the right upper lobe as described.  Nonobstructing left renal stone slightly increased in size from the prior study.  Aortic Atherosclerosis (ICD10-I70.0) and Emphysema (ICD10-J43.9).  She should continue lung cancer screening through Dr. Yehuda Mao office.     Interval History:  Historical data obtained from the note dated 10/21/2017.  76 y.o. female seen by Dr. Talbert Cage for evaluation of iron deficiency anemia.  She was treated with IV iron.   She had a colonoscopy on 01/22/17 which demonstrated five non-bleeding colonic angiodysplastic lesions, diverticulosis in the sigmoid colon, external hemorrhoids.   Current status:  Patient is seen today for follow-up.  She is here to go over labs.  She reports she gets lung cancer screening through Dr. Golden Circle office.      Problem List Patient Active Problem List   Diagnosis Date Noted  . Iron deficiency anemia due to chronic blood loss [D50.0] 04/22/2017  . History of colonic polyps [Z86.010] 09/18/2016  . Carotid stenosis [I65.29] 04/02/2016    Past Medical History Past Medical History:  Diagnosis Date  . Anemia   . Anxiety   . Arthritis   . Carotid artery occlusion   . Chronic kidney disease   . Colon polyps   . COPD (chronic obstructive pulmonary disease) ([place])   . Depression   . Dyspnea   . Heart murmur   . Hypertension   . Pneumonia   . PONV (postoperative nausea and vomiting)     Past Surgical History Past Surgical History:  Procedure Laterality Date  . CAROTID ENDARTERECTOMY    . CATARACT  EXTRACTION W/PHACO Left 01/02/2014   Procedure: CATARACT EXTRACTION PHACO AND INTRAOCULAR LENS PLACEMENT (IOC);  Surgeon: Tonny Branch, MD;  Location: [facility];  Service: Ophthalmology;  Laterality: Left;  CDE 8.17  . CATARACT EXTRACTION W/PHACO Right 03/06/2014   Procedure: CATARACT EXTRACTION PHACO AND INTRAOCULAR  LENS PLACEMENT ([place]);  Surgeon: Tonny Branch, MD;  Location: [facility];  Service: Ophthalmology;  Laterality: Right;  CDE:15.14  . CHOLECYSTECTOMY    . COLONOSCOPY  11/13/2011   Procedure: COLONOSCOPY;  Surgeon: Rogene Houston, MD;  [facility];  Service: Endoscopy;  Laterality: N/A;  1030  . COLONOSCOPY N/A 01/22/2017   Procedure: COLONOSCOPY;  Surgeon: Rogene Houston, MD;  [facility];  Service: Endoscopy;  Laterality: N/A;  1030  . ENDARTERECTOMY Left 04/02/2016   Procedure: ENDARTERECTOMY LEFT CAROTID ARTERY;  Surgeon: Rosetta Posner, MD;  Location: [facility];  Service: Vascular;  Laterality: Left;  . PATCH ANGIOPLASTY Left 04/02/2016   Procedure: PATCH ANGIOPLASTY LEFT CAROTID ARTERY USING HEMASHIELD PLATINUM FINESS PATCH;  Surgeon: Rosetta Posner, MD;  Location: [facility];  Service: Vascular;  Laterality: Left;    Family History Family History  Problem Relation Age of Onset  . Heart murmur Mother   . Heart Problems Mother   . Heart disease Mother   . Emphysema Father   . Cancer Sister   . Dementia Brother   . Colon cancer Neg Hx      Social History  reports that she has been smoking cigarettes.  She has a 50.00 pack-year smoking history. She has never used smokeless tobacco. She reports that she does not drink alcohol or use drugs.  Medications  Current Outpatient Medications:  .  aspirin 81 MG tablet, Take 81 mg by mouth daily., Disp: , Rfl:  .  ibuprofen (ADVIL,MOTRIN) 200 MG tablet, Take 200-400 mg by mouth daily as needed for headache or moderate pain. For pain, Disp: , Rfl:  .  losartan (COZAAR) 100 MG tablet, Take 100 mg by mouth daily. , Disp: , Rfl:  .  PARoxetine (PAXIL) 40 MG tablet, Take 40 mg by mouth every morning., Disp: , Rfl:   Allergies Augmentin [amoxicillin-pot clavulanate]; Vicodin [hydrocodone-acetaminophen]; and Quinolones  Review of Systems Review of Systems - Oncology ROS negative   Physical Exam  Vitals Wt Readings from Last 3  Encounters:  12/09/17 91 lb 8 oz (41.5 kg)  10/21/17 91 lb 3.2 oz (41.4 kg)  07/07/17 93 lb 8 oz (42.4 kg)   Temp Readings from Last 3 Encounters:  12/09/17 98.5 F (36.9 C) (Oral)  11/04/17 97.6 F (36.4 C) (Oral)  10/28/17 97.6 F (36.4 C) (Oral)   BP Readings from Last 3 Encounters:  12/09/17 140/62  11/04/17 (!) 158/61  10/28/17 (!) 145/58   Pulse Readings from Last 3 Encounters:  12/09/17 73  11/04/17 66  10/28/17 72   Constitutional: Well-developed, well-nourished, and in no distress.   HENT: Head: Normocephalic and atraumatic.  Mouth/Throat: No oropharyngeal exudate. Mucosa moist. Eyes: Pupils are equal, round, and reactive to light. Conjunctivae are normal. No scleral icterus.  Neck: Normal range of motion. Neck supple. No JVD present.  Cardiovascular: Normal rate, regular rhythm and normal heart sounds.  Exam reveals no gallop and no friction rub.   No murmur heard. Pulmonary/Chest: Effort normal and breath sounds normal. No respiratory distress. No wheezes.No rales.  Abdominal: Soft. Bowel sounds are normal. No distension. There is no tenderness. There is no guarding.  Musculoskeletal: No edema or  tenderness.  Lymphadenopathy: No cervical, axillary or supraclavicular adenopathy.  Neurological: Alert and oriented to person, place, and time. No cranial nerve deficit.  Skin: Skin is warm and dry. No rash noted. No erythema. No pallor.  Psychiatric: Affect and judgment normal.   Labs No visits with results within 3 Day(s) from this visit.  Latest known visit with results is:  Appointment on 12/02/2017  Component Date Value Ref Range Status  . WBC 12/02/2017 6.9  4.0 - 10.5 K/uL Final  . RBC 12/02/2017 4.31  3.87 - 5.11 MIL/uL Final  . Hemoglobin 12/02/2017 13.6  12.0 - 15.0 g/dL Final  . HCT 12/02/2017 40.5  36.0 - 46.0 % Final  . MCV 12/02/2017 94.0  78.0 - 100.0 fL Final  . MCH 12/02/2017 31.6  26.0 - 34.0 pg Final  . MCHC 12/02/2017 33.6  30.0 - 36.0 g/dL  Final  . RDW 12/02/2017 13.6  11.5 - 15.5 % Final  . Platelets 12/02/2017 270  150 - 400 K/uL Final  . Neutrophils Relative % 12/02/2017 76  % Final  . Neutro Abs 12/02/2017 5.2  1.7 - 7.7 K/uL Final  . Lymphocytes Relative 12/02/2017 13  % Final  . Lymphs Abs 12/02/2017 0.9  0.7 - 4.0 K/uL Final  . Monocytes Relative 12/02/2017 10  % Final  . Monocytes Absolute 12/02/2017 0.7  0.1 - 1.0 K/uL Final  . Eosinophils Relative 12/02/2017 1  % Final  . Eosinophils Absolute 12/02/2017 0.0  0.0 - 0.7 K/uL Final  . Basophils Relative 12/02/2017 0  % Final  . Basophils Absolute 12/02/2017 0.0  0.0 - 0.1 K/uL Final   Performed at [facility], [address] [place], [place] [address]  . Sodium 12/02/2017 137  135 - 145 mmol/L Final  . Potassium 12/02/2017 4.3  3.5 - 5.1 mmol/L Final  . Chloride 12/02/2017 107  98 - 111 mmol/L Final   Please note change in reference range.  . CO2 12/02/2017 24  22 - 32 mmol/L Final  . Glucose, Bld 12/02/2017 100* 70 - 99 mg/dL Final   Please note change in reference range.  . BUN 12/02/2017 20  8 - 23 mg/dL Final   Please note change in reference range.  . Creatinine, Ser 12/02/2017 0.80  0.44 - 1.00 mg/dL Final  . Calcium 12/02/2017 9.3  8.9 - 10.3 mg/dL Final  . Total Protein 12/02/2017 7.3  6.5 - 8.1 g/dL Final  . Albumin 12/02/2017 4.1  3.5 - 5.0 g/dL Final  . AST 12/02/2017 24  15 - 41 U/L Final  . ALT 12/02/2017 18  0 - 44 U/L Final   Please note change in reference range.  . Alkaline Phosphatase 12/02/2017 75  38 - 126 U/L Final  . Total Bilirubin 12/02/2017 0.6  0.3 - 1.2 mg/dL Final  . GFR calc non Af Amer 12/02/2017 >60  >60 mL/min Final  . GFR calc Af Amer 12/02/2017 >60  >60 mL/min Final   Comment: (NOTE) The eGFR has been calculated using the CKD EPI equation. This calculation has not been validated in all clinical situations. eGFR's persistently <60 mL/min signify possible Chronic Kidney Disease.   Georgiann Hahn gap 12/02/2017 6  5 - 15 Final    Performed at [facility], [address] [place], [place] [address]  . LDH 12/02/2017 148  98 - 192 U/L Final   Performed at [facility], [address] [place], [place] [address]  . Ferritin 12/02/2017 440* 11 - 307 ng/mL  Final   Performed at [facility], [place] [address] [place], [place] [address]     Pathology Orders Placed This Encounter  Procedures  . CBC with Differential/Platelet    Standing Status:   Future    Standing Expiration Date:   12/10/2018  . Comprehensive metabolic panel    Standing Status:   Future    Standing Expiration Date:   12/10/2018  . Lactate dehydrogenase    Standing Status:   Future    Standing Expiration Date:   12/10/2018  . Protein electrophoresis, serum    Standing Status:   Future    Standing Expiration Date:   12/10/2018  . Ferritin    Standing Status:   Future    Standing Expiration Date:   12/10/2018  . Kappa/lambda light chains    Standing Status:   Future    Standing Expiration Date:   12/10/2018  . IgG, IgA, IgM    Standing Status:   Future    Standing Expiration Date:   12/10/2018  . Beta 2 microglobulin, serum    Standing Status:   Future    Standing Expiration Date:   12/10/2018       Zoila Shutter MD

## 2018-02-25 ENCOUNTER — Other Ambulatory Visit (HOSPITAL_COMMUNITY): Payer: Self-pay | Admitting: Family Medicine

## 2018-02-25 DIAGNOSIS — Z9889 Other specified postprocedural states: Secondary | ICD-10-CM

## 2018-02-25 DIAGNOSIS — I10 Essential (primary) hypertension: Secondary | ICD-10-CM | POA: Diagnosis not present

## 2018-02-25 DIAGNOSIS — Z681 Body mass index (BMI) 19 or less, adult: Secondary | ICD-10-CM | POA: Diagnosis not present

## 2018-02-25 DIAGNOSIS — R0989 Other specified symptoms and signs involving the circulatory and respiratory systems: Secondary | ICD-10-CM | POA: Diagnosis not present

## 2018-02-25 DIAGNOSIS — F4320 Adjustment disorder, unspecified: Secondary | ICD-10-CM | POA: Diagnosis not present

## 2018-02-25 DIAGNOSIS — Z0001 Encounter for general adult medical examination with abnormal findings: Secondary | ICD-10-CM | POA: Diagnosis not present

## 2018-02-25 DIAGNOSIS — Z0000 Encounter for general adult medical examination without abnormal findings: Secondary | ICD-10-CM | POA: Diagnosis not present

## 2018-03-04 DIAGNOSIS — Z681 Body mass index (BMI) 19 or less, adult: Secondary | ICD-10-CM | POA: Diagnosis not present

## 2018-03-04 DIAGNOSIS — R42 Dizziness and giddiness: Secondary | ICD-10-CM | POA: Diagnosis not present

## 2018-03-08 DIAGNOSIS — Z23 Encounter for immunization: Secondary | ICD-10-CM | POA: Diagnosis not present

## 2018-03-09 ENCOUNTER — Ambulatory Visit (HOSPITAL_COMMUNITY)
Admission: RE | Admit: 2018-03-09 | Discharge: 2018-03-09 | Disposition: A | Discharge status: Home / Self Care | Payer: Medicare Other | Source: Ambulatory Visit | Attending: Family Medicine | Admitting: Family Medicine

## 2018-03-09 DIAGNOSIS — I6521 Occlusion and stenosis of right carotid artery: Secondary | ICD-10-CM | POA: Diagnosis not present

## 2018-03-09 DIAGNOSIS — Z9889 Other specified postprocedural states: Secondary | ICD-10-CM | POA: Diagnosis not present

## 2018-04-06 ENCOUNTER — Other Ambulatory Visit (HOSPITAL_COMMUNITY): Payer: Medicare Other

## 2018-04-12 ENCOUNTER — Other Ambulatory Visit (HOSPITAL_COMMUNITY): Payer: Self-pay | Admitting: Family Medicine

## 2018-04-12 DIAGNOSIS — Z1231 Encounter for screening mammogram for malignant neoplasm of breast: Secondary | ICD-10-CM

## 2018-04-13 ENCOUNTER — Ambulatory Visit (HOSPITAL_COMMUNITY): Payer: Medicare Other | Admitting: Internal Medicine

## 2018-05-12 DIAGNOSIS — J329 Chronic sinusitis, unspecified: Secondary | ICD-10-CM | POA: Diagnosis not present

## 2018-05-12 DIAGNOSIS — D692 Other nonthrombocytopenic purpura: Secondary | ICD-10-CM | POA: Diagnosis not present

## 2018-05-12 DIAGNOSIS — M810 Age-related osteoporosis without current pathological fracture: Secondary | ICD-10-CM | POA: Diagnosis not present

## 2018-05-12 DIAGNOSIS — Z681 Body mass index (BMI) 19 or less, adult: Secondary | ICD-10-CM | POA: Diagnosis not present

## 2018-05-13 ENCOUNTER — Ambulatory Visit (HOSPITAL_COMMUNITY): Payer: Medicare Other

## 2018-05-24 ENCOUNTER — Ambulatory Visit (HOSPITAL_COMMUNITY)
Admission: RE | Admit: 2018-05-24 | Discharge: 2018-05-24 | Disposition: A | Discharge status: Home / Self Care | Payer: Medicare Other | Source: Ambulatory Visit | Attending: Family Medicine | Admitting: Family Medicine

## 2018-05-24 DIAGNOSIS — Z1231 Encounter for screening mammogram for malignant neoplasm of breast: Secondary | ICD-10-CM | POA: Diagnosis not present

## 2018-06-28 ENCOUNTER — Telehealth: Payer: Self-pay

## 2018-06-29 NOTE — Telephone Encounter (Signed)
Lung cancer screening referral cancelled.  Letter sent to Dr Mathis Dad Higgs to make her aware.

## 2018-08-19 DIAGNOSIS — Z681 Body mass index (BMI) 19 or less, adult: Secondary | ICD-10-CM | POA: Diagnosis not present

## 2018-08-19 DIAGNOSIS — J441 Chronic obstructive pulmonary disease with (acute) exacerbation: Secondary | ICD-10-CM | POA: Diagnosis not present

## 2018-08-25 DIAGNOSIS — R413 Other amnesia: Secondary | ICD-10-CM | POA: Insufficient documentation

## 2018-09-01 ENCOUNTER — Emergency Department (HOSPITAL_COMMUNITY): Payer: Medicare Other

## 2018-09-01 ENCOUNTER — Encounter (HOSPITAL_COMMUNITY): Payer: Self-pay

## 2018-09-01 ENCOUNTER — Emergency Department (HOSPITAL_COMMUNITY)
Admission: EM | Admit: 2018-09-01 | Discharge: 2018-09-01 | Disposition: A | Discharge status: Home / Self Care | Payer: Medicare Other | Attending: Emergency Medicine | Admitting: Emergency Medicine

## 2018-09-01 ENCOUNTER — Other Ambulatory Visit: Payer: Self-pay

## 2018-09-01 DIAGNOSIS — I700 Atherosclerosis of aorta: Secondary | ICD-10-CM | POA: Diagnosis not present

## 2018-09-01 DIAGNOSIS — J449 Chronic obstructive pulmonary disease, unspecified: Secondary | ICD-10-CM | POA: Insufficient documentation

## 2018-09-01 DIAGNOSIS — F419 Anxiety disorder, unspecified: Secondary | ICD-10-CM | POA: Diagnosis not present

## 2018-09-01 DIAGNOSIS — F17210 Nicotine dependence, cigarettes, uncomplicated: Secondary | ICD-10-CM | POA: Diagnosis not present

## 2018-09-01 DIAGNOSIS — I129 Hypertensive chronic kidney disease with stage 1 through stage 4 chronic kidney disease, or unspecified chronic kidney disease: Secondary | ICD-10-CM | POA: Insufficient documentation

## 2018-09-01 DIAGNOSIS — J439 Emphysema, unspecified: Secondary | ICD-10-CM | POA: Diagnosis not present

## 2018-09-01 DIAGNOSIS — J9811 Atelectasis: Secondary | ICD-10-CM | POA: Diagnosis not present

## 2018-09-01 DIAGNOSIS — N189 Chronic kidney disease, unspecified: Secondary | ICD-10-CM | POA: Diagnosis not present

## 2018-09-01 DIAGNOSIS — R0602 Shortness of breath: Secondary | ICD-10-CM | POA: Diagnosis not present

## 2018-09-01 DIAGNOSIS — Z79899 Other long term (current) drug therapy: Secondary | ICD-10-CM | POA: Diagnosis not present

## 2018-09-01 DIAGNOSIS — I959 Hypotension, unspecified: Secondary | ICD-10-CM | POA: Diagnosis not present

## 2018-09-01 DIAGNOSIS — R51 Headache: Secondary | ICD-10-CM | POA: Diagnosis not present

## 2018-09-01 LAB — BASIC METABOLIC PANEL
Anion gap: 6 (ref 5–15)
BUN: 25 mg/dL — ABNORMAL HIGH (ref 8–23)
CO2: 26 mmol/L (ref 22–32)
Calcium: 10 mg/dL (ref 8.9–10.3)
Chloride: 105 mmol/L (ref 98–111)
Creatinine, Ser: 0.89 mg/dL (ref 0.44–1.00)
GFR calc Af Amer: >60 mL/min (ref >60)
GFR calc non Af Amer: >60 mL/min (ref >60)
Glucose, Bld: 102 mg/dL — ABNORMAL HIGH (ref 70–99)
Potassium: 4 mmol/L (ref 3.5–5.1)
Sodium: 137 mmol/L (ref 135–145)

## 2018-09-01 LAB — CBC WITH DIFFERENTIAL/PLATELET
Abs Immature Granulocytes: 0.06 10*3/uL (ref 0.00–0.07)
Basophils Absolute: 0.1 10*3/uL (ref 0.0–0.1)
Basophils Relative: 1 %
Eosinophils Absolute: 0 10*3/uL (ref 0.0–0.5)
Eosinophils Relative: 0 %
HCT: 36.3 % (ref 36.0–46.0)
Hemoglobin: 11.7 g/dL — ABNORMAL LOW (ref 12.0–15.0)
Immature Granulocytes: 1 %
Lymphocytes Relative: 10 %
Lymphs Abs: 1.4 10*3/uL (ref 0.7–4.0)
MCH: 29.8 pg (ref 26.0–34.0)
MCHC: 32.2 g/dL (ref 30.0–36.0)
MCV: 92.4 fL (ref 80.0–100.0)
Monocytes Absolute: 0.9 10*3/uL (ref 0.1–1.0)
Monocytes Relative: 7 %
Neutro Abs: 10.9 10*3/uL — ABNORMAL HIGH (ref 1.7–7.7)
Neutrophils Relative %: 81 %
Platelets: 476 10*3/uL — ABNORMAL HIGH (ref 150–400)
RBC: 3.93 MIL/uL (ref 3.87–5.11)
RDW: 14 % (ref 11.5–15.5)
WBC: 13.3 10*3/uL — ABNORMAL HIGH (ref 4.0–10.5)
nRBC: 0 % (ref 0.0–0.2)

## 2018-09-01 LAB — TROPONIN I: Troponin I: <0.03 ng/mL (ref <0.03)

## 2018-09-01 LAB — URINALYSIS, ROUTINE W REFLEX MICROSCOPIC
Bilirubin Urine: NEGATIVE
Glucose, UA: NEGATIVE mg/dL
Hgb urine dipstick: NEGATIVE
Ketones, ur: NEGATIVE mg/dL
Leukocytes,Ua: NEGATIVE
Nitrite: NEGATIVE
Protein, ur: NEGATIVE mg/dL
Specific Gravity, Urine: 1.015 (ref 1.005–1.030)
pH: 6 (ref 5.0–8.0)

## 2018-09-01 NOTE — ED Triage Notes (Signed)
Pt reports has copd and reports sob has gotten worse over the past few days.  Pt says she got scared and anxious today and called ems.  EMS says pt's lungs clear and o2 sat 100%.  Pt says she has noticed she's having problems remembering things too but unable to determine when that started but thinks has been for several days.

## 2018-09-01 NOTE — Discharge Instructions (Addendum)
You have been seen today for feeling off/shortness of breath. Please read and follow all provided instructions. Return to the emergency room for worsening condition or new concerning symptoms.    1. Medications:  Continue usual home medications Take medications as prescribed. Please review all of the medicines and only take them if you do not have an allergy to them.   2. Treatment: rest, drink plenty of fluids  3. Follow Up: Please follow up with your primary doctor in 2-5 days for discussion of your diagnoses and further evaluation after today's visit; Call today to arrange your follow up.    It is also a possibility that you have an allergic reaction to any of the medicines that you have been prescribed - Everybody reacts differently to medications and while MOST people have no trouble with most medicines, you may have a reaction such as nausea, vomiting, rash, swelling, shortness of breath. If this is the case, please stop taking the medicine immediately and contact your physician.  ?

## 2018-09-01 NOTE — ED Provider Notes (Signed)
[facility] EMERGENCY DEPARTMENT Provider Note   CSN: [id number] Arrival date & time: 09/01/18  1651    History   Chief Complaint Chief Complaint  Patient presents with  . Shortness of Breath    HPI Sophia Coleman is a 78 y.o. female with history significant for COPD, anxiety, hypertension presenting to emergency department today with chief complaint of feeling anxious.  Patient states this is been going on for the last 3 days.  States she normally has shortness of breath and cough which is unchanged today.  Her cough is nonproductive.  She has shortness of breath with activity.  She states she lives alone and was feeling anxious today with everything going on in the world. No known exposure to positive covid-19, however does admit to being around her daughter in law earlier today who has cold symptoms.  Denies fever, chills, abdominal pain, nausea, vomiting, urinary symptoms. History provided by pt.    Past Medical History:  Diagnosis Date  . Anemia   . Anxiety   . Arthritis   . Carotid artery occlusion   . Chronic kidney disease   . Colon polyps   . COPD (chronic obstructive pulmonary disease) ([place])   . Depression   . Dyspnea   . Heart murmur   . Hypertension   . Pneumonia   . PONV (postoperative nausea and vomiting)     Patient Active Problem List   Diagnosis Date Noted  . Iron deficiency anemia due to chronic blood loss 04/22/2017  . History of colonic polyps 09/18/2016  . Carotid stenosis 04/02/2016    Past Surgical History:  Procedure Laterality Date  . CAROTID ENDARTERECTOMY    . CATARACT EXTRACTION W/PHACO Left 01/02/2014   Procedure: CATARACT EXTRACTION PHACO AND INTRAOCULAR LENS PLACEMENT (IOC);  Surgeon: Tonny Branch, MD;  Location: [facility];  Service: Ophthalmology;  Laterality: Left;  CDE 8.17  . CATARACT EXTRACTION W/PHACO Right 03/06/2014   Procedure: CATARACT EXTRACTION PHACO AND INTRAOCULAR LENS PLACEMENT ([place]);  Surgeon: Tonny Branch, MD;  Location: [facility];  Service: Ophthalmology;  Laterality: Right;  CDE:15.14  . CHOLECYSTECTOMY    . COLONOSCOPY  11/13/2011   Procedure: COLONOSCOPY;  Surgeon: Rogene Houston, MD;  [facility];  Service: Endoscopy;  Laterality: N/A;  1030  . COLONOSCOPY N/A 01/22/2017   Procedure: COLONOSCOPY;  Surgeon: Rogene Houston, MD;  [facility];  Service: Endoscopy;  Laterality: N/A;  1030  . ENDARTERECTOMY Left 04/02/2016   Procedure: ENDARTERECTOMY LEFT CAROTID ARTERY;  Surgeon: Rosetta Posner, MD;  Location: [facility];  Service: Vascular;  Laterality: Left;  . PATCH ANGIOPLASTY Left 04/02/2016   Procedure: PATCH ANGIOPLASTY LEFT CAROTID ARTERY USING HEMASHIELD PLATINUM FINESS PATCH;  Surgeon: Rosetta Posner, MD;  Location: [facility] OR;  Service: Vascular;  Laterality: Left;     OB History   No obstetric history on file.      Home Medications    Prior to Admission medications   Medication Sig Start Date End Date Taking? Authorizing Provider  losartan (COZAAR) 100 MG tablet Take 100 mg by mouth every morning.    Yes [provider]  PARoxetine (PAXIL) 40 MG tablet Take 40 mg by mouth every morning.   Yes [provider]  doxycycline (VIBRAMYCIN) 100 MG capsule Take 100 mg by mouth 2 (two) times daily. 10 day course starting on 08/20/2018 08/20/18   [provider]    Family History Family History  Problem Relation Age of Onset  .  Heart murmur Mother   . Heart Problems Mother   . Heart disease Mother   . Emphysema Father   . Cancer Sister   . Dementia Brother   . Colon cancer Neg Hx     Social History Social History   Tobacco Use  . Smoking status: Current Every Day Smoker    Packs/day: 1.00    Years: 50.00    Pack years: 50.00    Types: Cigarettes  . Smokeless tobacco: Never Used  Substance Use Topics  . Alcohol use: No  . Drug use: No     Allergies   Augmentin [amoxicillin-pot clavulanate]; Vicodin [hydrocodone-acetaminophen]; and Quinolones    Review of Systems Review of Systems  Constitutional: Negative for chills and fever.  HENT: Negative for congestion, ear discharge, ear pain, sinus pressure, sinus pain and sore throat.   Eyes: Negative for pain and redness.  Respiratory: Positive for cough and shortness of breath.   Cardiovascular: Negative for chest pain and leg swelling.  Gastrointestinal: Negative for abdominal pain, constipation, diarrhea, nausea and vomiting.  Genitourinary: Negative for dysuria and hematuria.  Musculoskeletal: Negative for back pain and neck pain.  Skin: Negative for wound.  Neurological: Negative for weakness, numbness and headaches.  Psychiatric/Behavioral: The patient is nervous/anxious.      Physical Exam Updated Vital Signs BP 126/70 (BP Location: Right Arm)   Pulse 66   Temp 98.5 F (36.9 C) (Oral)   Resp 16   Ht 5\' 2"  (1.575 m)   Wt 43.1 kg   SpO2 99%   BMI 17.38 kg/m   Physical Exam Vitals signs and nursing note reviewed.  Constitutional:      Comments: Patient is well-appearing, no acute distress.  HENT:     Head: Normocephalic and atraumatic.     Nose: Nose normal.     Mouth/Throat:     Mouth: Mucous membranes are moist.     Pharynx: Oropharynx is clear.  Eyes:     General: No scleral icterus.    Extraocular Movements: Extraocular movements intact.     Conjunctiva/sclera: Conjunctivae normal.     Pupils: Pupils are equal, round, and reactive to light.  Neck:     Musculoskeletal: Normal range of motion.  Cardiovascular:     Rate and Rhythm: Normal rate and regular rhythm.     Pulses: Normal pulses.          Radial pulses are 2+ on the right side and 2+ on the left side.     Heart sounds: Normal heart sounds.  Pulmonary:     Effort: Pulmonary effort is normal.     Breath sounds: Normal breath sounds.     Comments: Lungs are clear to auscultation in all fields. Patient speaking in full sentences, no accessory muscle use.  No pursed lip breathing, no nasal flaring.   SpO2 on room air is 100% during exam. Abdominal:     General: There is no distension.     Palpations: Abdomen is soft.     Tenderness: There is no abdominal tenderness.  Musculoskeletal: Normal range of motion.     Right lower leg: No edema.     Left lower leg: No edema.  Skin:    General: Skin is warm and dry.     Capillary Refill: Capillary refill takes less than 2 seconds.  Neurological:     Mental Status: She is alert and oriented to person, place, and time.     Comments: Speech is clear and goal oriented,  follows commands CN III-XII intact, no facial droop Normal strength in upper and lower extremities bilaterally including dorsiflexion and plantar flexion, strong and equal grip strength Sensation normal to light and sharp touch Moves extremities without ataxia, coordination intact Normal finger to nose and rapid alternating movements Normal gait and balance    Psychiatric:        Behavior: Behavior normal.      ED Treatments / Results  Labs (all labs ordered are listed, but only abnormal results are displayed) Labs Reviewed  BASIC METABOLIC PANEL - Abnormal; Notable for the following components:      Result Value   Glucose, Bld 102 (*)    BUN 25 (*)    All other components within normal limits  CBC WITH DIFFERENTIAL/PLATELET - Abnormal; Notable for the following components:   WBC 13.3 (*)    Hemoglobin 11.7 (*)    Platelets 476 (*)    Neutro Abs 10.9 (*)    All other components within normal limits  URINALYSIS, ROUTINE W REFLEX MICROSCOPIC  TROPONIN I    EKG None  Radiology Dg Chest Portable 1 View  Result Date: 09/01/2018 CLINICAL DATA:  Shortness of breath.  History of emphysema EXAM: PORTABLE CHEST 1 VIEW COMPARISON:  Chest radiograph April 23, 2010 and chest CT May 01, 2017 FINDINGS: Lungs are hyperexpanded with relative lucency in much of the upper lobes consistent with emphysematous change. There is mild atelectatic change in the right base. There  is no frank edema or consolidation. The heart size is normal. The pulmonary vascularity is stable with diminished vascularity in the upper lobes, a stable finding. There is aortic atherosclerosis. No adenopathy. No bone lesions. IMPRESSION: Underlying emphysematous change. Mild atelectasis right base. No frank edema or consolidation. Stable cardiac silhouette. There is no adenopathy evident. Aortic Atherosclerosis (ICD10-I70.0) and Emphysema (ICD10-J43.9). Electronically Signed   By: Lowella Grip III M.D.   On: 09/01/2018 17:39    Procedures Procedures (including critical care time)  Medications Ordered in ED Medications - No data to display   Initial Impression / Assessment and Plan / ED Course  I have reviewed the triage vital signs and the nursing notes.  Pertinent labs & imaging results that were available during my care of the patient were reviewed by me and considered in my medical decision making (see chart for details).   Patient is afebrile, overall well-appearing, no acute distress.  Will initiate work-up with CBC, BMP, chest x-ray, EKG, UA.  She has COPD however based on presentation today's does not seem likely to be an exacerbation. DDX includes anxiety, COPD exacerbation, URI, pneumonia less likely influenza or covid-19, ACS.  Lab work today shows leukocytosis of 13.3, unlikeley caused by infection as pt is afebrile, her cough is unchanged from normal and is dry, no urinary complaints. BMP and UA are unremarkable, UA does not show signs of UTI. Chest xray viewed by me is without infiltrate, no edema or consolidation.   Patient is hemodynamically stable, in NAD, and able to ambulate in the ED. Evaluation does not show pathology that would require ongoing emergent intervention or inpatient treatment. I explained the diagnosis to the patient. Patient is comfortable with above plan and is stable for discharge at this time. All questions were answered prior to disposition. Strict  return precautions for returning to the ED were discussed. Encouraged follow up with PCP. Pt case discussed with Dr. Lita Mains who agrees with my plan.   This note was prepared with assistance of Dragon  voice recognition software. Occasional wrong-word or sound-a-like substitutions may have occurred due to the inherent limitations of voice recognition software.   Final Clinical Impressions(s) / ED Diagnoses   Final diagnoses:  Anxiety  SOB (shortness of breath)    ED Discharge Orders    None       Flint Melter 09/01/18 2040    Julianne Rice, MD 09/01/18 2215

## 2018-09-01 NOTE — ED Notes (Signed)
Pt has taken all of her cardiac leads off, blood pressure cuff off, and oxygen pulse ox. Pt states she is ready to go and wants to know what is taking so Sophia Coleman. This nurse called lab to check on troponin results. Lab states it should take 10 minutes. Patient notified. EDP states after the troponin pt will be re-evaluated and if within normal limits can be discharged. Pt notified.

## 2018-09-03 ENCOUNTER — Encounter (HOSPITAL_COMMUNITY): Payer: Self-pay

## 2018-09-03 ENCOUNTER — Emergency Department (HOSPITAL_COMMUNITY): Payer: Medicare Other

## 2018-09-03 ENCOUNTER — Other Ambulatory Visit: Payer: Self-pay

## 2018-09-03 ENCOUNTER — Emergency Department (HOSPITAL_COMMUNITY)
Admission: EM | Admit: 2018-09-03 | Discharge: 2018-09-03 | Disposition: A | Discharge status: Home / Self Care | Payer: Medicare Other | Attending: Emergency Medicine | Admitting: Emergency Medicine

## 2018-09-03 DIAGNOSIS — J449 Chronic obstructive pulmonary disease, unspecified: Secondary | ICD-10-CM | POA: Diagnosis not present

## 2018-09-03 DIAGNOSIS — Z7982 Long term (current) use of aspirin: Secondary | ICD-10-CM | POA: Diagnosis not present

## 2018-09-03 DIAGNOSIS — I129 Hypertensive chronic kidney disease with stage 1 through stage 4 chronic kidney disease, or unspecified chronic kidney disease: Secondary | ICD-10-CM | POA: Diagnosis not present

## 2018-09-03 DIAGNOSIS — Z79899 Other long term (current) drug therapy: Secondary | ICD-10-CM | POA: Diagnosis not present

## 2018-09-03 DIAGNOSIS — M25562 Pain in left knee: Secondary | ICD-10-CM | POA: Diagnosis present

## 2018-09-03 DIAGNOSIS — N189 Chronic kidney disease, unspecified: Secondary | ICD-10-CM | POA: Insufficient documentation

## 2018-09-03 DIAGNOSIS — R4182 Altered mental status, unspecified: Secondary | ICD-10-CM | POA: Diagnosis not present

## 2018-09-03 DIAGNOSIS — R600 Localized edema: Secondary | ICD-10-CM | POA: Diagnosis not present

## 2018-09-03 DIAGNOSIS — F17210 Nicotine dependence, cigarettes, uncomplicated: Secondary | ICD-10-CM | POA: Diagnosis not present

## 2018-09-03 DIAGNOSIS — R4189 Other symptoms and signs involving cognitive functions and awareness: Secondary | ICD-10-CM | POA: Diagnosis not present

## 2018-09-03 LAB — COMPREHENSIVE METABOLIC PANEL
ALT: 15 U/L (ref 0–44)
AST: 18 U/L (ref 15–41)
Albumin: 3.9 g/dL (ref 3.5–5.0)
Alkaline Phosphatase: 70 U/L (ref 38–126)
Anion gap: 6 (ref 5–15)
BUN: 25 mg/dL — ABNORMAL HIGH (ref 8–23)
CO2: 25 mmol/L (ref 22–32)
Calcium: 9.9 mg/dL (ref 8.9–10.3)
Chloride: 106 mmol/L (ref 98–111)
Creatinine, Ser: 0.85 mg/dL (ref 0.44–1.00)
GFR calc Af Amer: >60 mL/min (ref >60)
GFR calc non Af Amer: >60 mL/min (ref >60)
Glucose, Bld: 99 mg/dL (ref 70–99)
Potassium: 4.4 mmol/L (ref 3.5–5.1)
Sodium: 137 mmol/L (ref 135–145)
Total Bilirubin: 0.6 mg/dL (ref 0.3–1.2)
Total Protein: 7.3 g/dL (ref 6.5–8.1)

## 2018-09-03 LAB — CBC WITH DIFFERENTIAL/PLATELET
Abs Immature Granulocytes: 0.06 10*3/uL (ref 0.00–0.07)
Basophils Absolute: 0.1 10*3/uL (ref 0.0–0.1)
Basophils Relative: 1 %
Eosinophils Absolute: 0.1 10*3/uL (ref 0.0–0.5)
Eosinophils Relative: 1 %
HCT: 40 % (ref 36.0–46.0)
Hemoglobin: 12.9 g/dL (ref 12.0–15.0)
Immature Granulocytes: 1 %
Lymphocytes Relative: 10 %
Lymphs Abs: 1 10*3/uL (ref 0.7–4.0)
MCH: 30.1 pg (ref 26.0–34.0)
MCHC: 32.3 g/dL (ref 30.0–36.0)
MCV: 93.2 fL (ref 80.0–100.0)
Monocytes Absolute: 0.7 10*3/uL (ref 0.1–1.0)
Monocytes Relative: 7 %
Neutro Abs: 8.1 10*3/uL — ABNORMAL HIGH (ref 1.7–7.7)
Neutrophils Relative %: 80 %
Platelets: 527 10*3/uL — ABNORMAL HIGH (ref 150–400)
RBC: 4.29 MIL/uL (ref 3.87–5.11)
RDW: 14.3 % (ref 11.5–15.5)
WBC: 10.1 10*3/uL (ref 4.0–10.5)
nRBC: 0 % (ref 0.0–0.2)

## 2018-09-03 LAB — URINALYSIS, ROUTINE W REFLEX MICROSCOPIC
Bilirubin Urine: NEGATIVE
Glucose, UA: NEGATIVE mg/dL
Hgb urine dipstick: NEGATIVE
Ketones, ur: NEGATIVE mg/dL
Leukocytes,Ua: NEGATIVE
Nitrite: NEGATIVE
Protein, ur: NEGATIVE mg/dL
Specific Gravity, Urine: <1.005 — ABNORMAL LOW (ref 1.005–1.030)
pH: 6.5 (ref 5.0–8.0)

## 2018-09-03 NOTE — ED Provider Notes (Signed)
[facility] EMERGENCY DEPARTMENT Provider Note   CSN: [id number] Arrival date & time: 09/03/18  1127    History   Chief Complaint Chief Complaint  Patient presents with  . Altered Mental Status    HPI Sophia Coleman is a 78 y.o. female.     Patient felt a pop in her left knee when attempting to get out the shower last night.  No other obvious injuries.  Patient has had trouble bearing weight and ambulating.  Severity of pain is moderate.     Past Medical History:  Diagnosis Date  . Anemia   . Anxiety   . Arthritis   . Carotid artery occlusion   . Chronic kidney disease   . Colon polyps   . COPD (chronic obstructive pulmonary disease) ([place])   . Depression   . Dyspnea   . Heart murmur   . Hypertension   . Pneumonia   . PONV (postoperative nausea and vomiting)     Patient Active Problem List   Diagnosis Date Noted  . Iron deficiency anemia due to chronic blood loss 04/22/2017  . History of colonic polyps 09/18/2016  . Carotid stenosis 04/02/2016    Past Surgical History:  Procedure Laterality Date  . CAROTID ENDARTERECTOMY    . CATARACT EXTRACTION W/PHACO Left 01/02/2014   Procedure: CATARACT EXTRACTION PHACO AND INTRAOCULAR LENS PLACEMENT (IOC);  Surgeon: Tonny Branch, MD;  Location: [facility];  Service: Ophthalmology;  Laterality: Left;  CDE 8.17  . CATARACT EXTRACTION W/PHACO Right 03/06/2014   Procedure: CATARACT EXTRACTION PHACO AND INTRAOCULAR LENS PLACEMENT ([place]);  Surgeon: Tonny Branch, MD;  Location: [facility];  Service: Ophthalmology;  Laterality: Right;  CDE:15.14  . CHOLECYSTECTOMY    . COLONOSCOPY  11/13/2011   Procedure: COLONOSCOPY;  Surgeon: Rogene Houston, MD;  Location: [facility];  Service: Endoscopy;  Laterality: N/A;  1030  . COLONOSCOPY N/A 01/22/2017   Procedure: COLONOSCOPY;  Surgeon: Rogene Houston, MD;  [facility];  Service: Endoscopy;  Laterality: N/A;  1030  . ENDARTERECTOMY Left 04/02/2016   Procedure: ENDARTERECTOMY LEFT  CAROTID ARTERY;  Surgeon: Rosetta Posner, MD;  Location: [place];  Service: Vascular;  Laterality: Left;  . PATCH ANGIOPLASTY Left 04/02/2016   Procedure: PATCH ANGIOPLASTY LEFT CAROTID ARTERY USING HEMASHIELD PLATINUM FINESS PATCH;  Surgeon: Rosetta Posner, MD;  Location: [facility] OR;  Service: Vascular;  Laterality: Left;     OB History   No obstetric history on file.      Home Medications    Prior to Admission medications   Medication Sig Start Date End Date Taking? Authorizing Provider  aspirin EC 81 MG tablet Take 81 mg by mouth daily.   Yes [provider]  losartan (COZAAR) 100 MG tablet Take 100 mg by mouth every morning.    Yes [provider]  PARoxetine (PAXIL) 40 MG tablet Take 40 mg by mouth every morning.   Yes [provider]  doxycycline (VIBRAMYCIN) 100 MG capsule Take 100 mg by mouth 2 (two) times daily. 10 day course starting on 08/20/2018 08/20/18   [provider]    Family History Family History  Problem Relation Age of Onset  . Heart murmur Mother   . Heart Problems Mother   . Heart disease Mother   . Emphysema Father   . Cancer Sister   . Dementia Brother   . Colon cancer Neg Hx     Social History Social History   Tobacco Use  .  Smoking status: Current Every Day Smoker    Packs/day: 1.00    Years: 50.00    Pack years: 50.00    Types: Cigarettes  . Smokeless tobacco: Never Used  Substance Use Topics  . Alcohol use: No  . Drug use: No     Allergies   Augmentin [amoxicillin-pot clavulanate]; Vicodin [hydrocodone-acetaminophen]; and Quinolones   Review of Systems Review of Systems  All other systems reviewed and are negative.    Physical Exam Updated Vital Signs BP 130/79   Pulse 79   Temp 98.1 F (36.7 C) (Oral)   Resp 16   Ht 5\' 2"  (1.575 m)   Wt 43.9 kg   SpO2 94%   BMI 17.70 kg/m   Physical Exam Vitals signs and nursing note reviewed.  Constitutional:      Appearance: She is well-developed.  HENT:      Head: Normocephalic and atraumatic.  Eyes:     Conjunctiva/sclera: Conjunctivae normal.  Neck:     Musculoskeletal: Neck supple.  Cardiovascular:     Rate and Rhythm: Normal rate.  Pulmonary:     Effort: Pulmonary effort is normal.     Breath sounds: Normal breath sounds.  Musculoskeletal:     Comments: Left knee: Edema peri-knee joint.  Pain with flexion extension and lateralization.  Skin:    General: Skin is warm and dry.  Neurological:     Mental Status: She is alert and oriented to person, place, and time.  Psychiatric:        Behavior: Behavior normal.      ED Treatments / Results  Labs (all labs ordered are listed, but only abnormal results are displayed) Labs Reviewed  CBC WITH DIFFERENTIAL/PLATELET - Abnormal; Notable for the following components:      Result Value   Platelets 527 (*)    Neutro Abs 8.1 (*)    All other components within normal limits  COMPREHENSIVE METABOLIC PANEL - Abnormal; Notable for the following components:   BUN 25 (*)    All other components within normal limits  URINALYSIS, ROUTINE W REFLEX MICROSCOPIC - Abnormal; Notable for the following components:   Specific Gravity, Urine <1.005 (*)    All other components within normal limits    EKG None  Radiology Ct Head Wo Contrast  Result Date: 09/03/2018 CLINICAL DATA:  One-week history of inability to remember certain things. EXAM: CT HEAD WITHOUT CONTRAST TECHNIQUE: Contiguous axial images were obtained from the base of the skull through the vertex without intravenous contrast. COMPARISON:  MRI brain 03/05/2017. No prior CT head. FINDINGS: Brain: Ventricular system normal in size and appearance for age. No significant atrophy for age. Minimal changes of small vessel disease of the periventricular white matter, unchanged since the prior MRI. No mass lesion. No midline shift. No acute hemorrhage or hematoma. No extra-axial fluid collections. No evidence of acute infarction. Vascular: Mild  to moderate BILATERAL carotid siphon atherosclerosis. No hyperdense vessel. Skull: No skull fracture or other focal osseous abnormality involving the skull. Sinuses/Orbits: Visualized paranasal sinuses, bilateral mastoid air cells and bilateral middle ear cavities well-aerated. Visualized orbits and globes normal in appearance. Other: None. IMPRESSION: 1. No acute intracranial abnormality. 2. Stable minimal chronic microvascular ischemic changes of the white matter. Electronically Signed   By: Evangeline Dakin M.D.   On: 09/03/2018 13:31   Dg Chest Portable 1 View  Result Date: 09/01/2018 CLINICAL DATA:  Shortness of breath.  History of emphysema EXAM: PORTABLE CHEST 1 VIEW COMPARISON:  Chest radiograph  April 23, 2010 and chest CT May 01, 2017 FINDINGS: Lungs are hyperexpanded with relative lucency in much of the upper lobes consistent with emphysematous change. There is mild atelectatic change in the right base. There is no frank edema or consolidation. The heart size is normal. The pulmonary vascularity is stable with diminished vascularity in the upper lobes, a stable finding. There is aortic atherosclerosis. No adenopathy. No bone lesions. IMPRESSION: Underlying emphysematous change. Mild atelectasis right base. No frank edema or consolidation. Stable cardiac silhouette. There is no adenopathy evident. Aortic Atherosclerosis (ICD10-I70.0) and Emphysema (ICD10-J43.9). Electronically Signed   By: Lowella Grip III M.D.   On: 09/01/2018 17:39    Procedures Procedures (including critical care time)  Medications Ordered in ED Medications - No data to display   Initial Impression / Assessment and Plan / ED Course  I have reviewed the triage vital signs and the nursing notes.  Pertinent labs & imaging results that were available during my care of the patient were reviewed by me and considered in my medical decision making (see chart for details).        Patient presents with left knee  pain.  Differential includes ligamentous injury versus meniscus tear.  Plain films show a small joint effusion. RICE with knee immobilizer.  Referral to orthopedics.  Final Clinical Impressions(s) / ED Diagnoses   Final diagnoses:  Altered mental status, unspecified altered mental status type    ED Discharge Orders    None       Nat Christen, MD 09/03/18 [phone]

## 2018-09-03 NOTE — ED Notes (Addendum)
Patient's son stated pt has intermittent confusion for the past week and requests a head CT.

## 2018-09-03 NOTE — ED Notes (Signed)
Pt daughter

## 2018-09-03 NOTE — ED Notes (Addendum)
Pt states she is only here for CT of head. She stated she wants to get the scan and leave. Pt asked how long she would have to wait to be seen by the provider. I told patient the provider would in as soon as provider could. Patient exhibiting anxiety, stating she wants to ensure she does not have a blood clot in her head. Encouraged patient to relax. Patient requested door be left open to her room.

## 2018-09-03 NOTE — Discharge Instructions (Addendum)
CT scan of your head showed no obvious abnormalities other than a normal brain for your age.  Blood work and urine sample were normal.  Follow-up with your primary care doctor.

## 2018-09-03 NOTE — ED Triage Notes (Addendum)
Pt reports that she has been confused and forgetful this week. Pt wants a CT. Denies pain. Spoke with PCP on Wednesday and couldn't remember his name. States her head feels funny

## 2018-09-03 NOTE — ED Notes (Signed)
Patient put personal clothes on, and tried to walk out. Reoriented patient to situation. Told pt she couldn't leave with her IV in and that she needed to wait for CT results. Pt redirected to room.

## 2018-09-08 DIAGNOSIS — R413 Other amnesia: Secondary | ICD-10-CM | POA: Diagnosis not present

## 2018-09-08 DIAGNOSIS — J449 Chronic obstructive pulmonary disease, unspecified: Secondary | ICD-10-CM | POA: Diagnosis not present

## 2018-09-08 DIAGNOSIS — Z1389 Encounter for screening for other disorder: Secondary | ICD-10-CM | POA: Diagnosis not present

## 2018-09-08 DIAGNOSIS — Z681 Body mass index (BMI) 19 or less, adult: Secondary | ICD-10-CM | POA: Diagnosis not present

## 2018-09-14 ENCOUNTER — Encounter: Payer: Self-pay | Admitting: Diagnostic Neuroimaging

## 2018-09-16 ENCOUNTER — Encounter: Payer: Self-pay | Admitting: Diagnostic Neuroimaging

## 2018-09-16 ENCOUNTER — Ambulatory Visit (INDEPENDENT_AMBULATORY_CARE_PROVIDER_SITE_OTHER): Payer: Medicare Other | Admitting: Diagnostic Neuroimaging

## 2018-09-16 ENCOUNTER — Other Ambulatory Visit: Payer: Self-pay

## 2018-09-16 DIAGNOSIS — R413 Other amnesia: Secondary | ICD-10-CM | POA: Diagnosis not present

## 2018-09-16 NOTE — Progress Notes (Signed)
[facility]  PATIENT: Sophia Coleman DOB: 1940-10-08  REFERRING CLINICIAN: Eddie Candle HISTORY FROM: patient and daughter  REASON FOR VISIT: new consult    HISTORICAL  CHIEF COMPLAINT:  Chief Complaint  Patient presents with  . Memory Loss    HISTORY OF PRESENT ILLNESS:   78 year old female here for evaluation of memory loss and confusion.  Patient has had 6 to 12 months of mild short-term memory loss, forgetting recent conversations and events, names of grandchildren, without change in ADLs.  In April 2020 she had a more severe change in symptoms.  She had anxiety, shortness of breath, lightheadedness and confusion.  Patient went to the hospital on 09/01/2018 for evaluation.  Lab testing were unremarkable.  She was discharged home.  09/03/2018 patient continued to have symptoms and was very confused and amnestic to her first emergency room visit.  Therefore she went back to the emergency room and had CT scan of the head which was unremarkable.  Since that time patient is doing little bit better.  Patient typically lives alone.  Currently her family is with her.  She normally is able to take care of all of her ADLs.  Her PCP started her on Aricept 5 mg, but she took this dose and had severe crazy dreams, and she stopped the medication.  Patient has history of emphysema, hypertension, heart disease, carotid artery disease status post endarterectomy, ongoing tobacco abuse.    REVIEW OF SYSTEMS: Full 14 system review of systems performed and negative with exception of: As per HPI.  ALLERGIES: Allergies  Allergen Reactions  . Augmentin [Amoxicillin-Pot Clavulanate] Other (See Comments)    "SEVERE SWEATS" Has patient had a PCN reaction causing immediate rash, facial/tongue/throat swelling, SOB or lightheadedness with hypotension: No Has patient had a PCN reaction causing severe rash involving mucus membranes or skin necrosis: No Has patient had a PCN  reaction that required hospitalization: No Has patient had a PCN reaction occurring within the last 10 years: Yes If all of the above answers are "NO", then may proceed with Cephalosporin use.   . Vicodin [Hydrocodone-Acetaminophen] Nausea And Vomiting  . Quinolones Nausea And Vomiting    HOME MEDICATIONS: Outpatient Medications Prior to Visit  Medication Sig Dispense Refill  . donepezil (ARICEPT) 5 MG tablet Take 5 mg by mouth at bedtime. Per PCP    . aspirin EC 81 MG tablet Take 81 mg by mouth daily.    Marland Kitchen doxycycline (VIBRAMYCIN) 100 MG capsule Take 100 mg by mouth 2 (two) times daily. 10 day course starting on 08/20/2018    . losartan (COZAAR) 100 MG tablet Take 100 mg by mouth every morning.     Marland Kitchen PARoxetine (PAXIL) 40 MG tablet Take 40 mg by mouth every morning.     No facility-administered medications prior to visit.     PAST MEDICAL HISTORY: Past Medical History:  Diagnosis Date  . Anemia   . Anxiety   . Arthritis   . Carotid artery occlusion   . Chronic kidney disease   . Colon polyps   . COPD (chronic obstructive pulmonary disease) ([place])   . Depression   . Dyspnea   . Heart murmur   . Hypertension   . Memory difficulty   . Pneumonia   . PONV (postoperative nausea and vomiting)     PAST SURGICAL HISTORY: Past Surgical History:  Procedure Laterality Date  . CAROTID ENDARTERECTOMY    . CATARACT EXTRACTION W/PHACO Left 01/02/2014   Procedure: CATARACT EXTRACTION  PHACO AND INTRAOCULAR LENS PLACEMENT (IOC);  Surgeon: Tonny Branch, MD;  Location: [facility];  Service: Ophthalmology;  Laterality: Left;  CDE 8.17  . CATARACT EXTRACTION W/PHACO Right 03/06/2014   Procedure: CATARACT EXTRACTION PHACO AND INTRAOCULAR LENS PLACEMENT ([place]);  Surgeon: Tonny Branch, MD;  Location: [facility];  Service: Ophthalmology;  Laterality: Right;  CDE:15.14  . CHOLECYSTECTOMY    . COLONOSCOPY  11/13/2011   Procedure: COLONOSCOPY;  Surgeon: Rogene Houston, MD;  [facility];  Service:  Endoscopy;  Laterality: N/A;  1030  . COLONOSCOPY N/A 01/22/2017   Procedure: COLONOSCOPY;  Surgeon: Rogene Houston, MD;  [facility];  Service: Endoscopy;  Laterality: N/A;  1030  . ENDARTERECTOMY Left 04/02/2016   Procedure: ENDARTERECTOMY LEFT CAROTID ARTERY;  Surgeon: Rosetta Posner, MD;  Location: [place];  Service: Vascular;  Laterality: Left;  . PATCH ANGIOPLASTY Left 04/02/2016   Procedure: PATCH ANGIOPLASTY LEFT CAROTID ARTERY USING HEMASHIELD PLATINUM FINESS PATCH;  Surgeon: Rosetta Posner, MD;  Location: [facility] OR;  Service: Vascular;  Laterality: Left;    FAMILY HISTORY: Family History  Problem Relation Age of Onset  . Heart murmur Mother   . Heart Problems Mother   . Heart disease Mother   . Hypertension Mother   . Glaucoma Mother   . Emphysema Father        COPD  . Cancer Sister   . Dementia Brother   . Stroke Maternal Grandmother   . Heart attack Maternal Grandfather   . Kidney disease Paternal Grandfather   . Colon cancer Neg Hx     SOCIAL HISTORY: Social History   Socioeconomic History  . Marital status: Widowed    Spouse name: Not on file  . Number of children: 2  . Years of education: Not on file  . Highest education level: Not on file  Occupational History    Comment: retired  Scientific laboratory technician  . Financial resource strain: Not on file  . Food insecurity:    Worry: Not on file    Inability: Not on file  . Transportation needs:    Medical: Not on file    Non-medical: Not on file  Tobacco Use  . Smoking status: Current Every Day Smoker    Packs/day: 1.00    Years: 50.00    Pack years: 50.00    Types: Cigarettes  . Smokeless tobacco: Never Used  Substance and Sexual Activity  . Alcohol use: No  . Drug use: No  . Sexual activity: Not on file  Lifestyle  . Physical activity:    Days per week: Not on file    Minutes per session: Not on file  . Stress: Not on file  Relationships  . Social connections:    Talks on phone: Not on file    Gets  together: Not on file    Attends religious service: Not on file    Active member of club or organization: Not on file    Attends meetings of clubs or organizations: Not on file    Relationship status: Not on file  . Intimate partner violence:    Fear of current or ex partner: Not on file    Emotionally abused: Not on file    Physically abused: Not on file    Forced sexual activity: Not on file  Other Topics Concern  . Not on file  Social History Narrative   Lives alone     PHYSICAL EXAM  VIDEO EXAM  GENERAL  EXAM/CONSTITUTIONAL:  Vitals: There were no vitals filed for this visit.  There is no height or weight on file to calculate BMI. Wt Readings from Last 3 Encounters:  09/03/18 96 lb 12.5 oz (43.9 kg)  09/01/18 95 lb (43.1 kg)  12/09/17 91 lb 8 oz (41.5 kg)     Patient is in no distress; well developed, nourished and groomed; neck is supple   NEUROLOGIC: MENTAL STATUS:  MMSE - Mini Mental State Exam 09/16/2018  Orientation to time 4  Orientation to Place 5  Registration 3  Attention/ Calculation 2  Recall 0  Language- name 2 objects 2  Language- repeat 1  Language- follow 3 step command 3  Language- read & follow direction 1  Write a sentence 1  Copy design 0  Total score 22    awake, alert, oriented to person, place and time  recent and remote memory intact  normal attention and concentration  language fluent, comprehension intact, naming intact  fund of knowledge appropriate  HOARSE VOICE  CRANIAL NERVE:   2nd, 3rd, 4th, 6th - visual fields full to confrontation, extraocular muscles intact, no nystagmus  5th - facial sensation symmetric  7th - facial strength symmetric  8th - hearing intact  11th - shoulder shrug symmetric  12th - tongue protrusion midline  MOTOR:   NO TREMOR; NO DRIFT IN BUE  SENSORY:   normal and symmetric to light touch  COORDINATION:   fine finger movements normal      DIAGNOSTIC DATA (LABS, IMAGING,  TESTING) - I reviewed patient records, labs, notes, testing and imaging myself where available.  Lab Results  Component Value Date   WBC 10.1 09/03/2018   HGB 12.9 09/03/2018   HCT 40.0 09/03/2018   MCV 93.2 09/03/2018   PLT 527 (H) 09/03/2018      Component Value Date/Time   NA 137 09/03/2018 1233   K 4.4 09/03/2018 1233   CL 106 09/03/2018 1233   CO2 25 09/03/2018 1233   GLUCOSE 99 09/03/2018 1233   BUN 25 (H) 09/03/2018 1233   CREATININE 0.85 09/03/2018 1233   CALCIUM 9.9 09/03/2018 1233   PROT 7.3 09/03/2018 1233   ALBUMIN 3.9 09/03/2018 1233   AST 18 09/03/2018 1233   ALT 15 09/03/2018 1233   ALKPHOS 70 09/03/2018 1233   BILITOT 0.6 09/03/2018 1233   GFRNONAA >60 09/03/2018 1233   GFRAA >60 09/03/2018 1233   No results found for: CHOL, HDL, LDLCALC, LDLDIRECT, TRIG, CHOLHDL No results found for: HGBA1C Lab Results  Component Value Date   VITAMINB12 357 06/16/2017   No results found for: TSH   09/03/18 CT head (without) [I reviewed images myself and agree with interpretation. -VRP] 1. No acute intracranial abnormality. 2. Stable minimal chronic microvascular ischemic changes of the white matter.   ASSESSMENT AND PLAN  78 y.o. year old female here with:  Dx: memory loss (MCI vs mild / prodromal dementia) - mmse 22/30  1. Memory loss    Virtual Visit via Video Note  I connected with Si Gaul on 09/16/18 at 11:30 AM EDT by a video enabled telemedicine application and verified that I am speaking with the correct person using two identifiers.   I discussed the limitations of evaluation and management by telemedicine and the availability of in person appointments. The patient expressed understanding and agreed to proceed.  I discussed the assessment and treatment plan with the patient. The patient was provided an opportunity to ask questions and all were  answered. The patient agreed with the plan and demonstrated an understanding of the instructions.    The patient was advised to call back or seek an in-person evaluation if the symptoms worsen or if the condition fails to improve as anticipated.  I provided 45 minutes of non-face-to-face time during this encounter.   PLAN:  MEMORY LOSS / CONFUSION (MCI vs mild / prodromal dementia) - stop aricept (side effects) - may consider memantine 10mg  at bedtime; increase to twice a day after 1-2 weeks - safety / supervision issues reviewed - caregiver resources provided - caution with driving and finances - brain healthy activities reviewed, including optimizing nutrition, exercise, brain stimulating activities; also stopping smoking   Return for pending if symptoms worsen or fail to improve.    Penni Bombard, MD 12/21/2058, 15:61 AM Certified in Neurology, Neurophysiology and Neuroimaging  [facility] [address], [place], [place] [address] [phone]

## 2018-09-25 IMAGING — CT CT CHEST W/O CM
2 of 3 series · 15 of 36 positions shown, 18 images · non-contrast
Comparison: 06/18/2016

CLINICAL DATA: Follow-up lung nodule

EXAM:
CT CHEST WITHOUT CONTRAST
TECHNIQUE: Multidetector CT imaging of the chest was performed following the
standard protocol without IV contrast.

[Series 2: thorax · axial · 0.57mm/px · z∈[-228,+44]mm · 12 of 160 slices shown, 15 images]
[im 12/160  mediastinal]
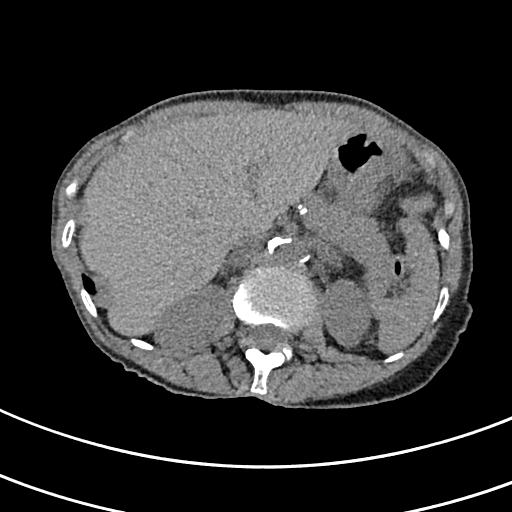
[im 12/160  lung]
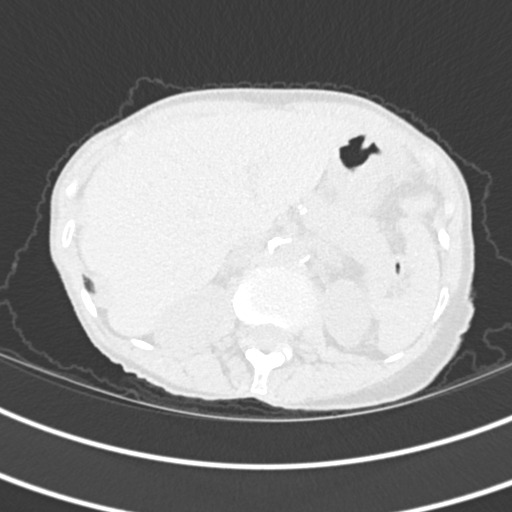
[im 24/160  lung]
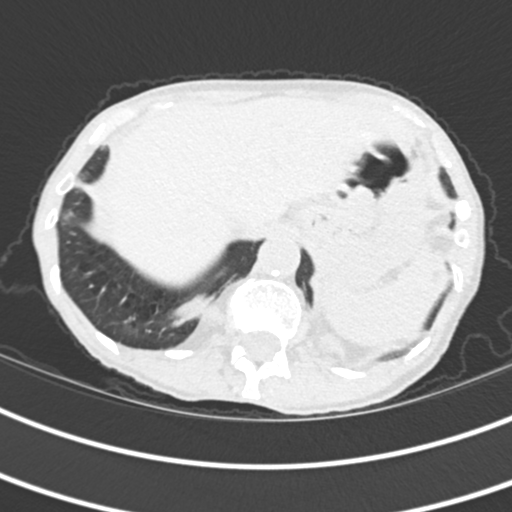
[im 36/160  lung]
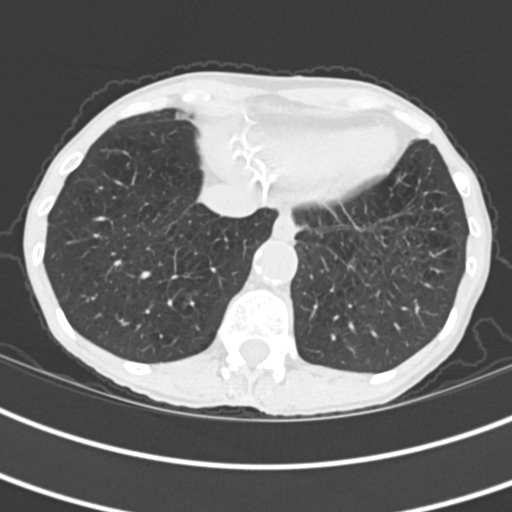
[im 48/160  lung]
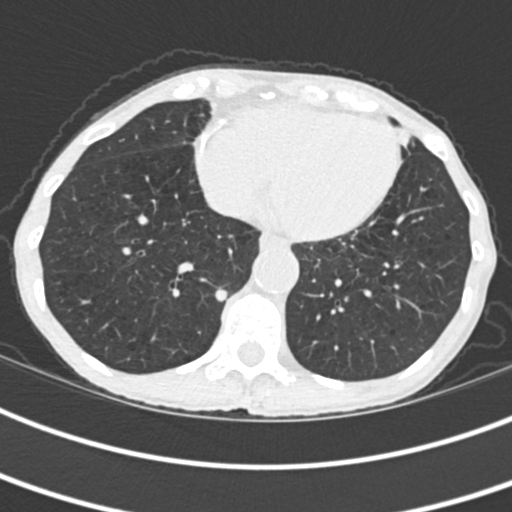
[im 59/160  mediastinal]
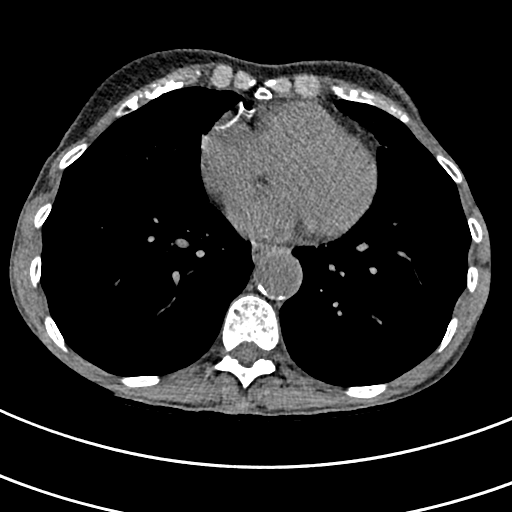
[im 59/160  lung]
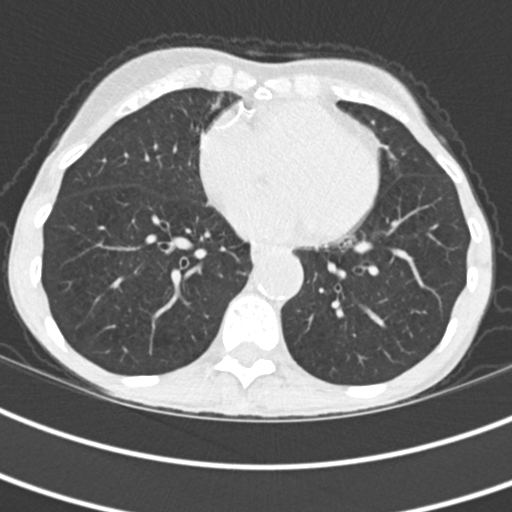
[im 71/160  lung]
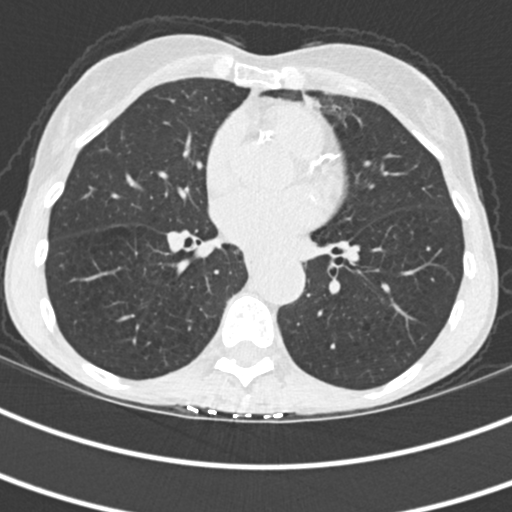
[im 89/160  lung]
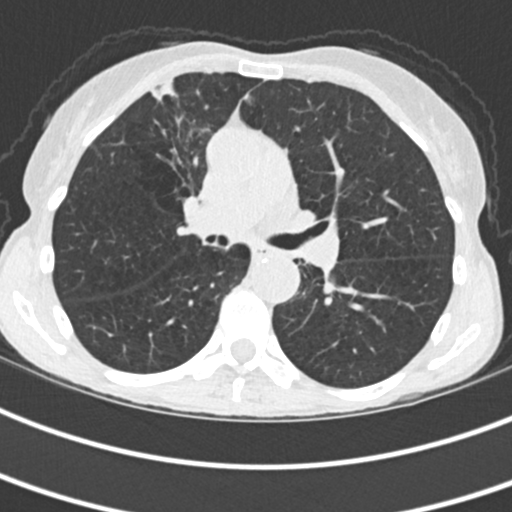
[im 101/160  lung]
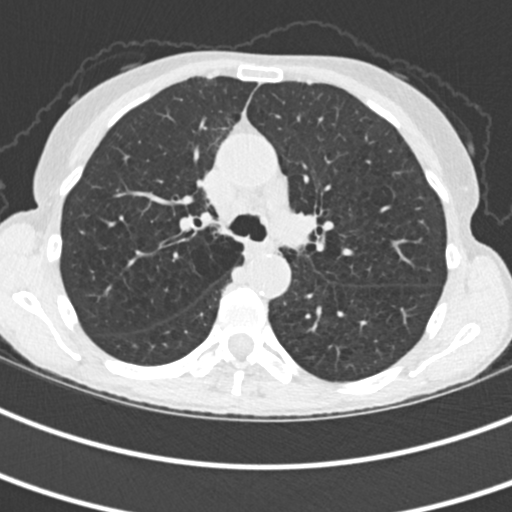
[im 112/160  mediastinal]
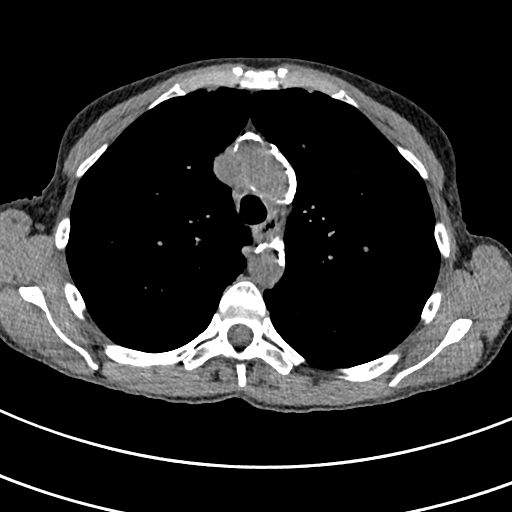
[im 112/160  lung]
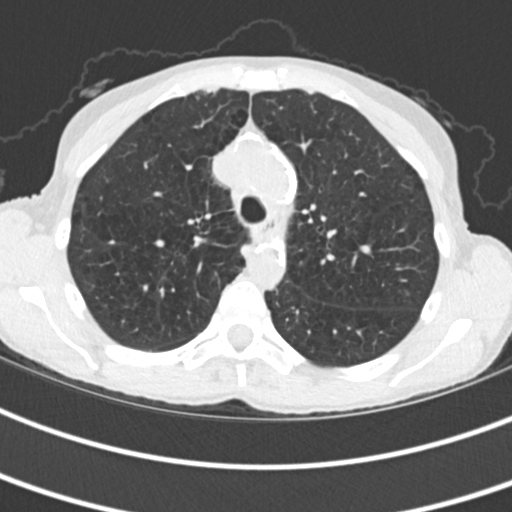
[im 124/160  lung]
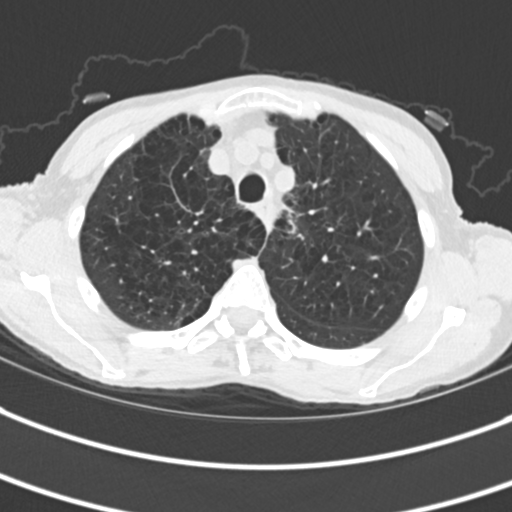
[im 136/160  lung]
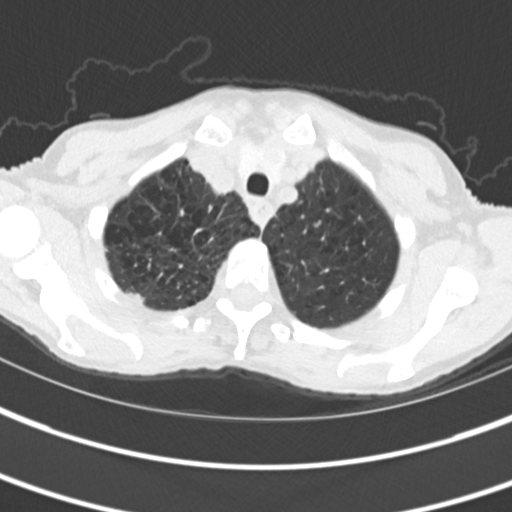
[im 148/160  lung]
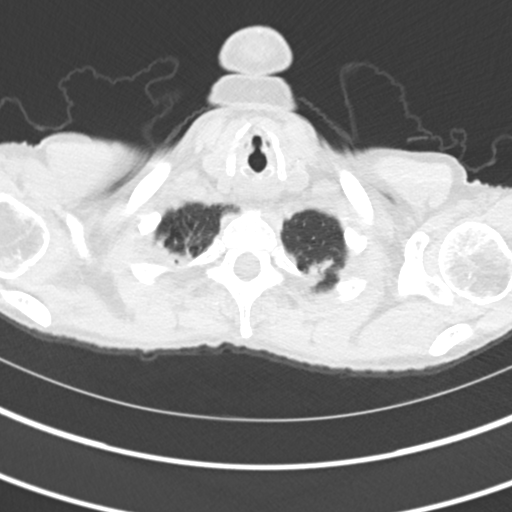

[Series 5: coronal · coronal · 0.62mm/px · 3 of 109 slices shown]
[im 22/109  lung]
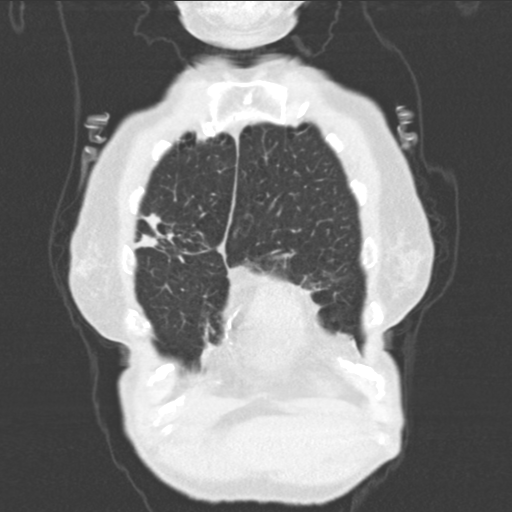
[im 44/109  lung]
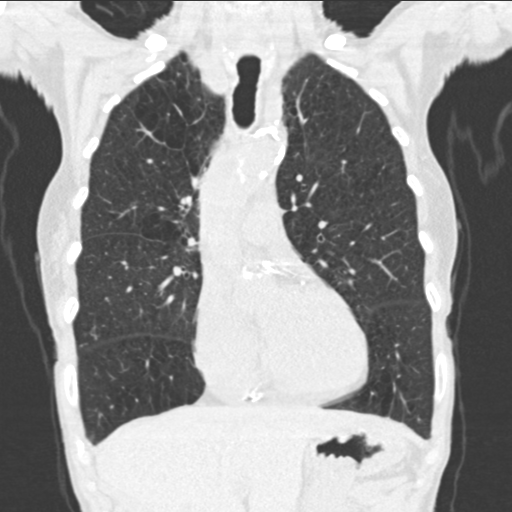
[im 65/109  lung]
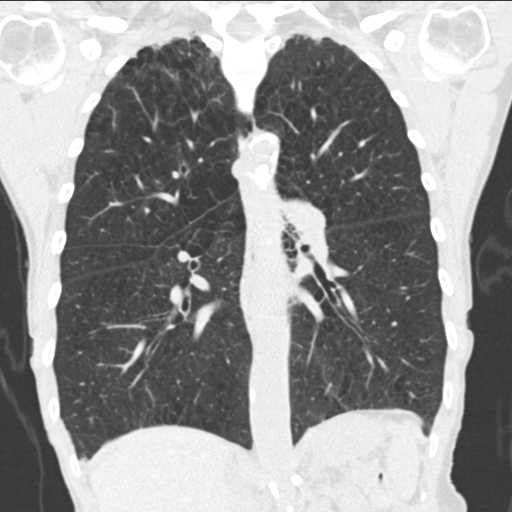

[15 of 36 positions shown; findings below may reference images not displayed]

FINDINGS: Cardiovascular: Diffuse atherosclerotic changes are noted. No
aneurysmal dilatation is noted. No cardiac enlargement is seen.
Heavy coronary calcifications are noted.

Mediastinum/Nodes: Thoracic inlet is within normal limits. No hilar
or mediastinal adenopathy is noted. The esophagus is within normal
limits.

Lungs/Pleura: Diffuse emphysematous changes are noted. Biapical
pleuroparenchymal scarring is seen. Previously seen patchy
infiltrative changes in the left base have resolved in the interval.
New patchy infiltrative changes are noted in the right upper lobe
along the minor fissure. Right lower lobe nodule is again identified
measuring 6-7 mm stable from the prior exam. No new nodules are
noted

Upper Abdomen: 6 mm nonobstructing left renal stone is noted. This
has increased in size from the prior exam at which time it measured
3-4 mm. The remainder of the upper abdomen is within normal limits.

Musculoskeletal: Mild degenerative changes of the thoracic spine are
noted. No acute abnormality is noted.
IMPRESSION: Stable right lower lobe nodule. Given the nodular stability future
CT at 12 months (from today's scan) is considered optional for
low-risk patients, but is recommended for high-risk patients. This
recommendation follows the consensus statement: Guidelines for
Management of Incidental Pulmonary Nodules Detected on CT Images:

Resolution of previously seen left lower lobe infiltrative change.
New patchy infiltrate is noted within the right upper lobe as
described.

Nonobstructing left renal stone slightly increased in size from the
prior study.

Aortic Atherosclerosis (ENXXS-QZP.P) and Emphysema (ENXXS-4YO.O).

## 2018-12-28 ENCOUNTER — Other Ambulatory Visit: Payer: Self-pay | Admitting: Family Medicine

## 2018-12-28 ENCOUNTER — Other Ambulatory Visit (HOSPITAL_COMMUNITY): Payer: Self-pay | Admitting: Family Medicine

## 2018-12-28 DIAGNOSIS — R221 Localized swelling, mass and lump, neck: Secondary | ICD-10-CM | POA: Diagnosis not present

## 2018-12-28 DIAGNOSIS — Z719 Counseling, unspecified: Secondary | ICD-10-CM | POA: Diagnosis not present

## 2018-12-28 DIAGNOSIS — Z681 Body mass index (BMI) 19 or less, adult: Secondary | ICD-10-CM | POA: Diagnosis not present

## 2018-12-28 DIAGNOSIS — R599 Enlarged lymph nodes, unspecified: Secondary | ICD-10-CM

## 2018-12-29 ENCOUNTER — Other Ambulatory Visit (HOSPITAL_COMMUNITY): Payer: Self-pay | Admitting: Family Medicine

## 2018-12-29 DIAGNOSIS — E2839 Other primary ovarian failure: Secondary | ICD-10-CM

## 2018-12-31 ENCOUNTER — Encounter (HOSPITAL_COMMUNITY): Payer: Self-pay

## 2018-12-31 ENCOUNTER — Ambulatory Visit (HOSPITAL_COMMUNITY): Payer: Medicare Other

## 2019-01-06 ENCOUNTER — Ambulatory Visit (HOSPITAL_COMMUNITY)
Admission: RE | Admit: 2019-01-06 | Discharge: 2019-01-06 | Disposition: A | Discharge status: Home / Self Care | Payer: Medicare Other | Source: Ambulatory Visit | Attending: Family Medicine | Admitting: Family Medicine

## 2019-01-06 ENCOUNTER — Other Ambulatory Visit: Payer: Self-pay

## 2019-01-06 DIAGNOSIS — E2839 Other primary ovarian failure: Secondary | ICD-10-CM | POA: Insufficient documentation

## 2019-01-06 DIAGNOSIS — R221 Localized swelling, mass and lump, neck: Secondary | ICD-10-CM | POA: Diagnosis not present

## 2019-01-06 DIAGNOSIS — R599 Enlarged lymph nodes, unspecified: Secondary | ICD-10-CM

## 2019-01-06 DIAGNOSIS — M810 Age-related osteoporosis without current pathological fracture: Secondary | ICD-10-CM | POA: Diagnosis not present

## 2019-01-06 DIAGNOSIS — Z780 Asymptomatic menopausal state: Secondary | ICD-10-CM | POA: Diagnosis not present

## 2019-01-11 ENCOUNTER — Telehealth: Payer: Self-pay | Admitting: Diagnostic Neuroimaging

## 2019-01-11 NOTE — Telephone Encounter (Signed)
Pt daughter(not on a DPR because pt has only had a virtual visit) daughter states pt has worsen. She accepted 1st available appointment and is on waitlist.  Daughter is asking if pt can see a NP.  Pt told the request would be submitted but no guarantee because of no DPR

## 2019-01-11 NOTE — Telephone Encounter (Addendum)
Spoke with daughter, Lynelle Smoke who was present when patient had virtual visit in April. [name] agreed to reschedule with NP for sooner follow up. We rescheduled, and I advised she bring current insurance information, med list. [name] verbalized understanding, appreciation.'

## 2019-01-18 ENCOUNTER — Other Ambulatory Visit: Payer: Self-pay

## 2019-01-18 ENCOUNTER — Encounter: Payer: Self-pay | Admitting: Family Medicine

## 2019-01-18 ENCOUNTER — Ambulatory Visit: Payer: Medicare Other | Admitting: Family Medicine

## 2019-01-18 ENCOUNTER — Encounter

## 2019-01-18 VITALS — BP 112/72 | HR 74 | Temp 98.0°F | Ht 62.0 in | Wt 87.2 lb

## 2019-01-18 DIAGNOSIS — R413 Other amnesia: Secondary | ICD-10-CM

## 2019-01-18 DIAGNOSIS — R42 Dizziness and giddiness: Secondary | ICD-10-CM | POA: Diagnosis not present

## 2019-01-18 DIAGNOSIS — Z681 Body mass index (BMI) 19 or less, adult: Secondary | ICD-10-CM | POA: Diagnosis not present

## 2019-01-18 MED ORDER — MEMANTINE HCL 28 X 5 MG & 21 X 10 MG PO TABS: ORAL_TABLET | ORAL | 12 refills | Status: DC

## 2019-01-18 NOTE — Progress Notes (Signed)
PATIENT: Sophia Coleman DOB: 02/03/1941  REASON FOR VISIT: follow up HISTORY FROM: patient  Chief Complaint  Patient presents with  . Follow-up    Memory f/u. Daughter present. Rm 5. Patient's daughter mentioned that her short term memory is worse. She complained of a foggy feeling.      HISTORY OF PRESENT ILLNESS: Today 01/18/19 Sophia Coleman is a 78 y.o. female here today for follow up for concerns of memory loss. She has tried Aricept in the past with concerns unwanted dreams. She was advised to consider Namenda at last visit.  She feels that this may be a reasonable medication to consider.  Admits to suffering from depression.  She has been on Paxil 40 mg for many years.  She has attempted to switch to a different SSRI in the past but feels that she was unable to tolerate increased anxiety.  She lives at home alone.  She is able to perform all ADLs.  She is able to dose her own medications.  She does have family members that check on her regularly and live close by.  She continues to drive locally and short distances.  There have been no concerns of accidents or getting lost.  No falls at home.   HISTORY: (copied from Dr Gladstone Lighter note on 09/16/2018)  78 year old female here for evaluation of memory loss and confusion.  Patient has had 6 to 12 months of mild short-term memory loss, forgetting recent conversations and events, names of grandchildren, without change in ADLs.  In April 2020 she had a more severe change in symptoms.  She had anxiety, shortness of breath, lightheadedness and confusion.  Patient went to the hospital on 09/01/2018 for evaluation.  Lab testing were unremarkable.  She was discharged home.  09/03/2018 patient continued to have symptoms and was very confused and amnestic to her first emergency room visit.  Therefore she went back to the emergency room and had CT scan of the head which was unremarkable.  Since that time patient is doing little bit better.   Patient typically lives alone.  Currently her family is with her.  She normally is able to take care of all of her ADLs.  Her PCP started her on Aricept 5 mg, but she took this dose and had severe crazy dreams, and she stopped the medication.  Patient has history of emphysema, hypertension, heart disease, carotid artery disease status post endarterectomy, ongoing tobacco abuse.   REVIEW OF SYSTEMS: Out of a complete 14 system review of symptoms, the patient complains only of the following symptoms, memory loss, dizziness and all other reviewed systems are negative.  ALLERGIES: Allergies  Allergen Reactions  . Augmentin [Amoxicillin-Pot Clavulanate] Other (See Comments)    "SEVERE SWEATS" Has patient had a PCN reaction causing immediate rash, facial/tongue/throat swelling, SOB or lightheadedness with hypotension: No Has patient had a PCN reaction causing severe rash involving mucus membranes or skin necrosis: No Has patient had a PCN reaction that required hospitalization: No Has patient had a PCN reaction occurring within the last 10 years: Yes If all of the above answers are "NO", then may proceed with Cephalosporin use.   . Vicodin [Hydrocodone-Acetaminophen] Nausea And Vomiting  . Quinolones Nausea And Vomiting    HOME MEDICATIONS: Outpatient Medications Prior to Visit  Medication Sig Dispense Refill  . aspirin EC 81 MG tablet Take 81 mg by mouth daily.    Marland Kitchen losartan (COZAAR) 100 MG tablet Take 100 mg by mouth every morning.     Marland Kitchen  PARoxetine (PAXIL) 40 MG tablet Take 40 mg by mouth every morning.    . donepezil (ARICEPT) 5 MG tablet Take 5 mg by mouth at bedtime. Per PCP    . doxycycline (VIBRAMYCIN) 100 MG capsule Take 100 mg by mouth 2 (two) times daily. 10 day course starting on 08/20/2018     No facility-administered medications prior to visit.     PAST MEDICAL HISTORY: Past Medical History:  Diagnosis Date  . Anemia   . Anxiety   . Arthritis   . Carotid artery  occlusion   . Chronic kidney disease   . Colon polyps   . COPD (chronic obstructive pulmonary disease) ([place])   . Depression   . Dyspnea   . Heart murmur   . Hypertension   . Memory difficulty   . Pneumonia   . PONV (postoperative nausea and vomiting)     PAST SURGICAL HISTORY: Past Surgical History:  Procedure Laterality Date  . CAROTID ENDARTERECTOMY    . CATARACT EXTRACTION W/PHACO Left 01/02/2014   Procedure: CATARACT EXTRACTION PHACO AND INTRAOCULAR LENS PLACEMENT (IOC);  Surgeon: Tonny Branch, MD;  Location: [facility];  Service: Ophthalmology;  Laterality: Left;  CDE 8.17  . CATARACT EXTRACTION W/PHACO Right 03/06/2014   Procedure: CATARACT EXTRACTION PHACO AND INTRAOCULAR LENS PLACEMENT ([place]);  Surgeon: Tonny Branch, MD;  Location: [facility];  Service: Ophthalmology;  Laterality: Right;  CDE:15.14  . CHOLECYSTECTOMY    . COLONOSCOPY  11/13/2011   Procedure: COLONOSCOPY;  Surgeon: Rogene Houston, MD;  Location: [facility];  Service: Endoscopy;  Laterality: N/A;  1030  . COLONOSCOPY N/A 01/22/2017   Procedure: COLONOSCOPY;  Surgeon: Rogene Houston, MD;  Location: [facility];  Service: Endoscopy;  Laterality: N/A;  1030  . ENDARTERECTOMY Left 04/02/2016   Procedure: ENDARTERECTOMY LEFT CAROTID ARTERY;  Surgeon: Rosetta Posner, MD;  Location: [facility];  Service: Vascular;  Laterality: Left;  . PATCH ANGIOPLASTY Left 04/02/2016   Procedure: PATCH ANGIOPLASTY LEFT CAROTID ARTERY USING HEMASHIELD PLATINUM FINESS PATCH;  Surgeon: Rosetta Posner, MD;  Location: [facility] OR;  Service: Vascular;  Laterality: Left;    FAMILY HISTORY: Family History  Problem Relation Age of Onset  . Heart murmur Mother   . Heart Problems Mother   . Heart disease Mother   . Hypertension Mother   . Glaucoma Mother   . Emphysema Father        COPD  . Cancer Sister   . Dementia Brother   . Stroke Maternal Grandmother   . Heart attack Maternal Grandfather   . Kidney disease Paternal Grandfather   . Colon cancer Neg  Hx     SOCIAL HISTORY: Social History   Socioeconomic History  . Marital status: Widowed    Spouse name: Not on file  . Number of children: 2  . Years of education: Not on file  . Highest education level: Not on file  Occupational History    Comment: retired  Scientific laboratory technician  . Financial resource strain: Not on file  . Food insecurity    Worry: Not on file    Inability: Not on file  . Transportation needs    Medical: Not on file    Non-medical: Not on file  Tobacco Use  . Smoking status: Current Every Day Smoker    Packs/day: 1.00    Years: 50.00    Pack years: 50.00    Types: Cigarettes  . Smokeless tobacco: Never Used  Substance and Sexual Activity  .  Alcohol use: No  . Drug use: No  . Sexual activity: Not on file  Lifestyle  . Physical activity    Days per week: Not on file    Minutes per session: Not on file  . Stress: Not on file  Relationships  . Social Herbalist on phone: Not on file    Gets together: Not on file    Attends religious service: Not on file    Active member of club or organization: Not on file    Attends meetings of clubs or organizations: Not on file    Relationship status: Not on file  . Intimate partner violence    Fear of current or ex partner: Not on file    Emotionally abused: Not on file    Physically abused: Not on file    Forced sexual activity: Not on file  Other Topics Concern  . Not on file  Social History Narrative   Lives alone      PHYSICAL EXAM  Vitals:   01/18/19 1440  BP: 112/72  Pulse: 74  Temp: 98 F (36.7 C)  TempSrc: Oral  Weight: 87 lb 3.2 oz (39.6 kg)  Height: 5\' 2"  (1.575 m)   Body mass index is 15.95 kg/m.  Generalized: Well developed, in no acute distress  Cardiology: normal rate and rhythm, no murmur noted Neurological examination  Mentation: Alert oriented to time, place, history taking. Follows all commands speech and language fluent Cranial nerve II-XII: Pupils were equal round  reactive to light. Extraocular movements were full, visual field were full on confrontational test. Facial sensation and strength were normal. Uvula tongue midline. Head turning and shoulder shrug  were normal and symmetric. Motor: The motor testing reveals 5 over 5 strength of all 4 extremities. Good symmetric motor tone is noted throughout.  Sensory: Sensory testing is intact to soft touch on all 4 extremities. No evidence of extinction is noted.  Coordination: Cerebellar testing reveals mildly ataxic finger-nose-finger and heel-to-shin bilaterally.  Gait and station: Gait is normal. Tandem gait is normal. Romberg is negative. No drift is seen.    DIAGNOSTIC DATA (LABS, IMAGING, TESTING) - I reviewed patient records, labs, notes, testing and imaging myself where available.  MMSE - Mini Mental State Exam 01/18/2019 09/16/2018  Not completed: (No Data) -  Orientation to time 4 4  Orientation to Place 2 5  Registration 3 3  Attention/ Calculation 0 2  Recall 0 0  Language- name 2 objects 2 2  Language- repeat 1 1  Language- follow 3 step command 3 3  Language- read & follow direction 1 1  Write a sentence 1 1  Copy design 0 0  Total score 17 22     Lab Results  Component Value Date   WBC 10.1 09/03/2018   HGB 12.9 09/03/2018   HCT 40.0 09/03/2018   MCV 93.2 09/03/2018   PLT 527 (H) 09/03/2018      Component Value Date/Time   NA 137 09/03/2018 1233   K 4.4 09/03/2018 1233   CL 106 09/03/2018 1233   CO2 25 09/03/2018 1233   GLUCOSE 99 09/03/2018 1233   BUN 25 (H) 09/03/2018 1233   CREATININE 0.85 09/03/2018 1233   CALCIUM 9.9 09/03/2018 1233   PROT 7.3 09/03/2018 1233   ALBUMIN 3.9 09/03/2018 1233   AST 18 09/03/2018 1233   ALT 15 09/03/2018 1233   ALKPHOS 70 09/03/2018 1233   BILITOT 0.6 09/03/2018 1233   GFRNONAA >60  09/03/2018 1233   GFRAA >60 09/03/2018 1233   No results found for: CHOL, HDL, LDLCALC, LDLDIRECT, TRIG, CHOLHDL No results found for: HGBA1C Lab  Results  Component Value Date   [id number] 06/16/2017   No results found for: TSH   ASSESSMENT AND PLAN 78 y.o. year old female  has a past medical history of Anemia, Anxiety, Arthritis, Carotid artery occlusion, Chronic kidney disease, Colon polyps, COPD (chronic obstructive pulmonary disease) ([place]), Depression, Dyspnea, Heart murmur, Hypertension, Memory difficulty, Pneumonia, and PONV (postoperative nausea and vomiting). here with     ICD-10-CM   1. Memory loss  R41.3     Mrs. Loehrer continues to suffer from memory loss.  I suspect that depression is contributing as well.  We have discussed treatment options in detail.  She wishes to start Namenda.  I have called in a titration pack and discussed appropriate ministration with her daughter.  They will assist her in administration of medication.  We have also discussed the need to consider alternate treatment for depression.  I have asked that she reach out to her primary care provider regarding treatment options.  We have discussed use of Zoloft or Lexapro if deemed appropriate by primary care.  I have encouraged her to continue with regular activity.  I would like for her to be as active as possible.  Well-balanced diet advised.  I have advised caution with driving.  I have also discussed with her daughter considering a formal evaluation with OT for driving safety.  She will discuss with her family.  And will call with any concerns or new symptoms after starting Namenda.  She will follow-up with me in 6 months, sooner if needed.  She verbalizes understanding and agreement with this plan.   No orders of the defined types were placed in this encounter.    Meds ordered this encounter  Medications  . memantine (NAMENDA TITRATION PAK) tablet pack    Sig: 5 mg/day for =1 week; 5 mg twice daily for =1 week; 15 mg/day given in 5 mg and 10 mg separated doses for =1 week; then 10 mg twice daily    Dispense:  49 tablet    Refill:  12    Order  Specific Question:   Supervising Provider    Answer:   Melvenia Beam [id number]      I spent 15 minutes with the patient. 50% of this time was spent counseling and educating patient on plan of care and medications.    Debbora Presto, FNP-C 01/18/2019, 4:24 PM [facility] [address] [place], [place] [address] [phone]

## 2019-01-18 NOTE — Patient Instructions (Addendum)
Start Namenda as prescribed with titration pack, call with any new symptoms or concerns  Discuss treatment options for depression with PCP. Sometimes Zoloft (sertraline) or Lexapro (escitalopram) work well in elderly patients.   Please work on being psychically active daily  Follow up with me in 6 months   Dementia Caregiver Guide Dementia is a term used to describe a number of symptoms that affect memory and thinking. The most common symptoms include:  Memory loss.  Trouble with language and communication.  Trouble concentrating.  Poor judgment.  Problems with reasoning.  Child-like behavior and language.  Extreme anxiety.  Angry outbursts.  Wandering from home or public places. Dementia usually gets worse slowly over time. In the early stages, people with dementia can stay independent and safe with some help. In later stages, they need help with daily tasks such as dressing, grooming, and using the bathroom. How to help the person with dementia cope Dementia can be frightening and confusing. Here are some tips to help the person with dementia cope with changes caused by the disease. General tips  Keep the person on track with his or her routine.  Try to identify areas where the person may need help.  Be supportive, patient, calm, and encouraging.  Gently remind the person that adjusting to changes takes time.  Help with the tasks that the person has asked for help with.  Keep the person involved in daily tasks and decisions as much as possible.  Encourage conversation, but try not to get frustrated or harried if the person struggles to find words or does not seem to appreciate your help. Communication tips  When the person is talking or seems frustrated, make eye contact and hold the person's hand.  Ask specific questions that need yes or no answers.  Use simple words, short sentences, and a calm voice. Only give one direction at a time.  When offering  choices, limit them to just 1 or 2.  Avoid correcting the person in a negative way.  If the person is struggling to find the right words, gently try to help him or her. How to recognize symptoms of stress Symptoms of stress in caregivers include:  Feeling frustrated or angry with the person with dementia.  Denying that the person has dementia or that his or her symptoms will not improve.  Feeling hopeless and unappreciated.  Difficulty sleeping.  Difficulty concentrating.  Feeling anxious, irritable, or depressed.  Developing stress-related health problems.  Feeling like you have too little time for your own life. Follow these instructions at home:   Make sure that you and the person you are caring for: ? Get regular sleep. ? Exercise regularly. ? Eat regular, nutritious meals. ? Drink enough fluid to keep your urine clear or pale yellow. ? Take over-the-counter and prescription medicines only as told by your health care providers. ? Attend all scheduled health care appointments.  Join a support group with others who are caregivers.  Ask about respite care resources so that you can have a regular break from the stress of caregiving.  Look for signs of stress in yourself and in the person you are caring for. If you notice signs of stress, take steps to manage it.  Consider any safety risks and take steps to avoid them.  Organize medications in a pill box for each day of the week.  Create a plan to handle any legal or financial matters. Get legal or financial advice if needed.  Keep a calendar  in a central location to remind the person of appointments or other activities. Tips for reducing the risk of injury  Keep floors clear of clutter. Remove rugs, magazine racks, and floor lamps.  Keep hallways well lit, especially at night.  Put a handrail and nonslip mat in the bathtub or shower.  Put childproof locks on cabinets that contain dangerous items, such as  medicines, alcohol, guns, toxic cleaning items, sharp tools or utensils, matches, and lighters.  Put the locks in places where the person cannot see or reach them easily. This will help ensure that the person does not wander out of the house and get lost.  Be prepared for emergencies. Keep a list of emergency phone numbers and addresses in a convenient area.  Remove car keys and lock garage doors so that the person does not try to get in the car and drive.  Have the person wear a bracelet that tracks locations and identifies the person as having memory problems. This should be worn at all times for safety. Where to find support: Many individuals and organizations offer support. These include:  Support groups for people with dementia and for caregivers.  Counselors or therapists.  Home health care services.  Adult day care centers. Where to find more information Alzheimer's Association: CapitalMile.co.nz Contact a health care provider if:  The person's health is rapidly getting worse.  You are no longer able to care for the person.  Caring for the person is affecting your physical and emotional health.  The person threatens himself or herself, you, or anyone else. Summary  Dementia is a term used to describe a number of symptoms that affect memory and thinking.  Dementia usually gets worse slowly over time.  Take steps to reduce the person's risk of injury, and to plan for future care.  Caregivers need support, relief from caregiving, and time for their own lives. This information is not intended to replace advice given to you by your health care provider. Make sure you discuss any questions you have with your health care provider. Document Released: 04/15/2016 Document Revised: 04/24/2017 Document Reviewed: 04/15/2016 Elsevier Patient Education  2020 [place]. Dementia Dementia is a condition that affects the way the brain functions. It often affects memory and thinking.  Usually, dementia gets worse with time and cannot be reversed (progressive dementia). There are many types of dementia, including:  Alzheimer's disease. This type is the most common.  Vascular dementia. This type may happen as the result of a stroke.  Lewy body dementia. This type may happen to people who have Parkinson's disease.  Frontotemporal dementia. This type is caused by damage to nerve cells (neurons) in certain parts of the brain. Some people may be affected by more than one type of dementia. This is called mixed dementia. What are the causes? Dementia is caused by damage to cells in the brain. The area of the brain and the types of cells damaged determine the type of dementia. Usually, this damage is irreversible or cannot be undone. Some examples of irreversible causes include:  Conditions that affect the blood vessels of the brain, such as diabetes, heart disease, or blood vessel disease.  Genetic mutations. In some cases, changes in the brain may be caused by another condition and can be reversed or slowed. Some examples of reversible causes include:  Injury to the brain.  Certain medicines.  Infection, such as meningitis.  Metabolic problems, such as vitamin B12 deficiency or thyroid disease.  Pressure on the  brain, such as from a tumor or blood clot. What are the signs or symptoms? Symptoms of dementia depend on the type of dementia. Common signs of dementia include problems with remembering, thinking, problem solving, decision making, and communicating. These signs develop slowly or get worse with time. This may include:  Problems remembering things.  Having trouble taking a bath or putting clothes on.  Forgetting appointments.  Forgetting to pay bills.  Difficulty planning and preparing meals.  Having trouble speaking.  Getting lost easily. How is this diagnosed? This condition is diagnosed by a specialist (neurologist). It is diagnosed based on the  history of your symptoms, your medical history, a physical exam, and tests. Tests may include:  Tests to evaluate brain function, such as memory tests, cognitive tests, and other tests.  Lab tests, such as blood or urine tests.  Imaging tests, such as a CT scan, a PET scan, or an MRI.  Genetic testing. This may be done if other family members have a diagnosis of certain types of dementia. Your health care provider will talk with you and your family, friends, or caregivers about your history and symptoms. How is this treated?  Treatment for this condition depends on the cause of the dementia. Progressive dementias, such as Alzheimer's disease, cannot be cured, but there may be treatments that help to manage symptoms. Treatment might involve taking medicines that may help to:  Control the dementia.  Slow down the progression of the dementia.  Manage symptoms. In some cases, treating the cause of your dementia can improve symptoms, reverse symptoms, or slow down how quickly your dementia becomes worse. Your health care provider can direct you to support groups, organizations, and other health care providers who can help with decisions about your care. Follow these instructions at home: Medicines  Take over-the-counter and prescription medicines only as told by your health care provider.  Use a pill organizer or pill reminder to help you manage your medicines.  Avoid taking medicines that can affect thinking, such as pain medicines or sleeping medicines. Lifestyle  Make healthy lifestyle choices. ? Be physically active as told by your health care provider. ? Do not use any products that contain nicotine or tobacco, such as cigarettes, e-cigarettes, and chewing tobacco. If you need help quitting, ask your health care provider. ? Do not drink alcohol. ? Practice stress-management techniques when you get stressed. ? Spend time with other people.  Make sure to get quality sleep. These  tips can help you get a good night's rest: ? Avoid napping during the day. ? Keep your sleeping area dark and cool. ? Avoid exercising during the few hours before you go to bed. ? Avoid caffeine products in the evening. Eating and drinking  Drink enough fluid to keep your urine pale yellow.  Eat a healthy diet. General instructions   Work with your health care provider to determine what you need help with and what your safety needs are.  Talk with your health care provider about whether it is safe for you to drive.  If you were given a bracelet that identifies you as a person with memory loss or tracks your location, make sure to wear it at all times.  Work with your family to make important decisions, such as advance directives, medical power of attorney, or a living will.  Keep all follow-up visits as told by your health care provider. This is important. Where to find more information  Alzheimer's Association: [employer]Mile.co.nz  National Institute  on Aging: DVDEnthusiasts.nl  World Health Organization: RoleLink.com.br Contact a health care provider if:  You have any new or worsening symptoms.  You have problems with choking or swallowing. Get help right away if:  You feel depressed or sad, or feel that you want to harm yourself.  Your family members become concerned for your safety. If you ever feel like you may hurt yourself or others, or have thoughts about taking your own life, get help right away. You can go to your nearest emergency department or call:  Your local emergency services (911 in the [place]  A suicide crisis helpline, such as the [place] at 484-481-0557. This is open 24 hours a day. Summary  Dementia is a condition that affects the way the brain functions. Dementia often affects memory and thinking.  Usually, dementia gets worse with time and cannot be reversed (progressive dementia).  Treatment for this condition  depends on the cause of the dementia.  Work with your health care provider to determine what you need help with and what your safety needs are.  Your health care provider can direct you to support groups, organizations, and other health care providers who can help with decisions about your care. This information is not intended to replace advice given to you by your health care provider. Make sure you discuss any questions you have with your health care provider. Document Released: 11/05/2000 Document Revised: 07/27/2018 Document Reviewed: 07/27/2018 Elsevier Patient Education  2020 [employer].

## 2019-02-04 NOTE — Progress Notes (Signed)
I reviewed note and agree with plan.   Penni Bombard, MD [id number], [id number] AM Certified in Neurology, Neurophysiology and Neuroimaging  [facility] [address] [place], [place] [address] [phone]

## 2019-03-10 DIAGNOSIS — Z0001 Encounter for general adult medical examination with abnormal findings: Secondary | ICD-10-CM | POA: Diagnosis not present

## 2019-03-10 DIAGNOSIS — N182 Chronic kidney disease, stage 2 (mild): Secondary | ICD-10-CM | POA: Diagnosis not present

## 2019-03-10 DIAGNOSIS — R413 Other amnesia: Secondary | ICD-10-CM | POA: Diagnosis not present

## 2019-03-10 DIAGNOSIS — J449 Chronic obstructive pulmonary disease, unspecified: Secondary | ICD-10-CM | POA: Diagnosis not present

## 2019-03-10 DIAGNOSIS — Z681 Body mass index (BMI) 19 or less, adult: Secondary | ICD-10-CM | POA: Diagnosis not present

## 2019-03-23 ENCOUNTER — Other Ambulatory Visit: Payer: Self-pay

## 2019-03-23 DIAGNOSIS — I6523 Occlusion and stenosis of bilateral carotid arteries: Secondary | ICD-10-CM

## 2019-03-25 ENCOUNTER — Telehealth (HOSPITAL_COMMUNITY): Payer: Self-pay

## 2019-03-25 NOTE — Telephone Encounter (Signed)

## 2019-03-28 ENCOUNTER — Telehealth: Payer: Self-pay | Admitting: *Deleted

## 2019-03-28 ENCOUNTER — Ambulatory Visit (INDEPENDENT_AMBULATORY_CARE_PROVIDER_SITE_OTHER): Payer: Medicare Other | Admitting: Family

## 2019-03-28 ENCOUNTER — Encounter: Payer: Self-pay | Admitting: Family

## 2019-03-28 ENCOUNTER — Ambulatory Visit (HOSPITAL_COMMUNITY)
Admission: RE | Admit: 2019-03-28 | Discharge: 2019-03-28 | Disposition: A | Discharge status: Home / Self Care | Payer: Medicare Other | Source: Ambulatory Visit | Attending: Family | Admitting: Family

## 2019-03-28 ENCOUNTER — Other Ambulatory Visit: Payer: Self-pay

## 2019-03-28 VITALS — BP 152/74 | HR 63 | Temp 97.5°F | Resp 14 | Ht 61.5 in | Wt 88.0 lb

## 2019-03-28 DIAGNOSIS — F17200 Nicotine dependence, unspecified, uncomplicated: Secondary | ICD-10-CM

## 2019-03-28 DIAGNOSIS — Z9889 Other specified postprocedural states: Secondary | ICD-10-CM | POA: Diagnosis not present

## 2019-03-28 DIAGNOSIS — I6523 Occlusion and stenosis of bilateral carotid arteries: Secondary | ICD-10-CM | POA: Diagnosis not present

## 2019-03-28 NOTE — Patient Instructions (Signed)
Steps to Quit Smoking Smoking tobacco is the leading cause of preventable death. It can affect almost every organ in the body. Smoking puts you and people around you at risk for many serious, long-lasting (chronic) diseases. Quitting smoking can be hard, but it is one of the best things that you can do for your health. It is never too late to quit. How do I get ready to quit? When you decide to quit smoking, make a plan to help you succeed. Before you quit:  Pick a date to quit. Set a date within the next 2 weeks to give you time to prepare.  Write down the reasons why you are quitting. Keep this list in places where you will see it often.  Tell your family, friends, and co-workers that you are quitting. Their support is important.  Talk with your doctor about the choices that may help you quit.  Find out if your health insurance will pay for these treatments.  Know the people, places, things, and activities that make you want to smoke (triggers). Avoid them. What first steps can I take to quit smoking?  Throw away all cigarettes at home, at work, and in your car.  Throw away the things that you use when you smoke, such as ashtrays and lighters.  Clean your car. Make sure to empty the ashtray.  Clean your home, including curtains and carpets. What can I do to help me quit smoking? Talk with your doctor about taking medicines and seeing a counselor at the same time. You are more likely to succeed when you do both.  If you are pregnant or breastfeeding, talk with your doctor about counseling or other ways to quit smoking. Do not take medicine to help you quit smoking unless your doctor tells you to do so. To quit smoking: Quit right away  Quit smoking totally, instead of slowly cutting back on how much you smoke over a period of time.  Go to counseling. You are more likely to quit if you go to counseling sessions regularly. Take medicine You may take medicines to help you quit. Some  medicines need a prescription, and some you can buy over-the-counter. Some medicines may contain a drug called nicotine to replace the nicotine in cigarettes. Medicines may:  Help you to stop having the desire to smoke (cravings).  Help to stop the problems that come when you stop smoking (withdrawal symptoms). Your doctor may ask you to use:  Nicotine patches, gum, or lozenges.  Nicotine inhalers or sprays.  Non-nicotine medicine that is taken by mouth. Find resources Find resources and other ways to help you quit smoking and remain smoke-free after you quit. These resources are most helpful when you use them often. They include:  Online chats with a counselor.  Phone quitlines.  Printed self-help materials.  Support groups or group counseling.  Text messaging programs.  Mobile phone apps. Use apps on your mobile phone or tablet that can help you stick to your quit plan. There are many free apps for mobile phones and tablets as well as websites. Examples include Quit Guide from the CDC and smokefree.gov  What things can I do to make it easier to quit?   Talk to your family and friends. Ask them to support and encourage you.  Call a phone quitline (1-800-QUIT-NOW), reach out to support groups, or work with a counselor.  Ask people who smoke to not smoke around you.  Avoid places that make you want to smoke,   such as: ? Bars. ? Parties. ? Smoke-break areas at work.  Spend time with people who do not smoke.  Lower the stress in your life. Stress can make you want to smoke. Try these things to help your stress: ? Getting regular exercise. ? Doing deep-breathing exercises. ? Doing yoga. ? Meditating. ? Doing a body scan. To do this, close your eyes, focus on one area of your body at a time from head to toe. Notice which parts of your body are tense. Try to relax the muscles in those areas. How will I feel when I quit smoking? Day 1 to 3 weeks Within the first 24 hours,  you may start to have some problems that come from quitting tobacco. These problems are very bad 2-3 days after you quit, but they do not often last for more than 2-3 weeks. You may get these symptoms:  Mood swings.  Feeling restless, nervous, angry, or annoyed.  Trouble concentrating.  Dizziness.  Strong desire for high-sugar foods and nicotine.  Weight gain.  Trouble pooping (constipation).  Feeling like you may vomit (nausea).  Coughing or a sore throat.  Changes in how the medicines that you take for other issues work in your body.  Depression.  Trouble sleeping (insomnia). Week 3 and afterward After the first 2-3 weeks of quitting, you may start to notice more positive results, such as:  Better sense of smell and taste.  Less coughing and sore throat.  Slower heart rate.  Lower blood pressure.  Clearer skin.  Better breathing.  Fewer sick days. Quitting smoking can be hard. Do not give up if you fail the first time. Some people need to try a few times before they succeed. Do your best to stick to your quit plan, and talk with your doctor if you have any questions or concerns. Summary  Smoking tobacco is the leading cause of preventable death. Quitting smoking can be hard, but it is one of the best things that you can do for your health.  When you decide to quit smoking, make a plan to help you succeed.  Quit smoking right away, not slowly over a period of time.  When you start quitting, seek help from your doctor, family, or friends. This information is not intended to replace advice given to you by your health care provider. Make sure you discuss any questions you have with your health care provider. Document Released: 03/08/2009 Document Revised: 07/30/2018 Document Reviewed: 07/31/2018 Elsevier Patient Education  2020 Elsevier Inc.     Stroke Prevention Some medical conditions and lifestyle choices can lead to a higher risk for a stroke. You can  help to prevent a stroke by making nutrition, lifestyle, and other changes. What nutrition changes can be made?   Eat healthy foods. ? Choose foods that are high in fiber. These include:  Fresh fruits.  Fresh vegetables.  Whole grains. ? Eat at least 5 or more servings of fruits and vegetables each day. Try to fill half of your plate at each meal with fruits and vegetables. ? Choose lean protein foods. These include:  Lowfat (lean) cuts of meat.  Chicken without skin.  Fish.  Tofu.  Beans.  Nuts. ? Eat low-fat dairy products. ? Avoid foods that:  Are high in salt (sodium).  Have saturated fat.  Have trans fat.  Have cholesterol.  Are processed.  Are premade.  Follow eating guidelines as told by your doctor. These may include: ? Reducing how many calories you   eat and drink each day. ? Limiting how much salt you eat or drink each day to 1,500 milligrams (mg). ? Using only healthy fats for cooking. These include:  Olive oil.  Canola oil.  Sunflower oil. ? Counting how many carbohydrates you eat and drink each day. What lifestyle changes can be made?  Try to stay at a healthy weight. Talk to your doctor about what a good weight is for you.  Get at least 30 minutes of moderate physical activity at least 5 days a week. This can include: ? Fast walking. ? Biking. ? Swimming.  Do not use any products that have nicotine or tobacco. This includes cigarettes and e-cigarettes. If you need help quitting, ask your doctor. Avoid being around tobacco smoke in general.  Limit how much alcohol you drink to no more than 1 drink a day for nonpregnant women and 2 drinks a day for men. One drink equals 12 oz of beer, 5 oz of wine, or 1 oz of hard liquor.  Do not use drugs.  Avoid taking birth control pills. Talk to your doctor about the risks of taking birth control pills if: ? You are over 35 years old. ? You smoke. ? You get migraines. ? You have had a blood clot.  What other changes can be made?  Manage your cholesterol. ? It is important to eat a healthy diet. ? If your cholesterol cannot be managed through your diet, you may also need to take medicines. Take medicines as told by your doctor.  Manage your diabetes. ? It is important to eat a healthy diet and to exercise regularly. ? If your blood sugar cannot be managed through diet and exercise, you may need to take medicines. Take medicines as told by your doctor.  Control your high blood pressure (hypertension). ? Try to keep your blood pressure below 130/80. This can help lower your risk of stroke. ? It is important to eat a healthy diet and to exercise regularly. ? If your blood pressure cannot be managed through diet and exercise, you may need to take medicines. Take medicines as told by your doctor. ? Ask your doctor if you should check your blood pressure at home. ? Have your blood pressure checked every year. Do this even if your blood pressure is normal.  Talk to your doctor about getting checked for a sleep disorder. Signs of this can include: ? Snoring a lot. ? Feeling very tired.  Take over-the-counter and prescription medicines only as told by your doctor. These may include aspirin or blood thinners (antiplatelets or anticoagulants).  Make sure that any other medical conditions you have are managed. Where to find more information  American Stroke Association: www.strokeassociation.org  National Stroke Association: www.stroke.org Get help right away if:  You have any symptoms of stroke. "BE FAST" is an easy way to remember the main warning signs: ? B - Balance. Signs are dizziness, sudden trouble walking, or loss of balance. ? E - Eyes. Signs are trouble seeing or a sudden change in how you see. ? F - Face. Signs are sudden weakness or loss of feeling of the face, or the face or eyelid drooping on one side. ? A - Arms. Signs are weakness or loss of feeling in an arm. This  happens suddenly and usually on one side of the body. ? S - Speech. Signs are sudden trouble speaking, slurred speech, or trouble understanding what people say. ? T - Time. Time to call emergency   services. Write down what time symptoms started.  You have other signs of stroke, such as: ? A sudden, very bad headache with no known cause. ? Feeling sick to your stomach (nausea). ? Throwing up (vomiting). ? Jerky movements you cannot control (seizure). These symptoms may represent a serious problem that is an emergency. Do not wait to see if the symptoms will go away. Get medical help right away. Call your local emergency services (911 in the [place] Do not drive yourself to the hospital. Summary  You can prevent a stroke by eating healthy, exercising, not smoking, drinking less alcohol, and treating other health problems, such as diabetes, high blood pressure, or high cholesterol.  Do not use any products that contain nicotine or tobacco, such as cigarettes and e-cigarettes.  Get help right away if you have any signs or symptoms of a stroke. This information is not intended to replace advice given to you by your health care provider. Make sure you discuss any questions you have with your health care provider. Document Released: 11/11/2011 Document Revised: 07/08/2018 Document Reviewed: 08/13/2016 Elsevier Patient Education  2020 Elsevier Inc.  

## 2019-03-28 NOTE — Progress Notes (Signed)
Virtual Visit via Telephone Note  I connected with Sophia Coleman on 03/28/2019 using the [employer] by telephone and verified that I was speaking with the correct person using two identifiers. Patient was located at her home and accompanied by her daughter. I am located at the [facility] office/clinic.   The limitations of evaluation and management by telemedicine and the availability of in person appointments have been previously discussed with the patient and are documented in the patients chart. The patient expressed understanding and consented to proceed.  PCP: Sharilyn Sites, MD  Chief Complaint: Follow up extracranial carotid artery stenosis   History of Present Illness: Sophia Coleman is a 78 y.o. female who is s/p left carotid endarterectomy on 04/02/2016 by Dr. Donnetta Hutching for severe asymptomatic disease.   She denies any known history of stroke or TIA. Specifically she deniesa history of amaurosis fugax or monocular blindness, unilateral facial drooping, hemiplegia, orreceptive or expressive aphasia.    Dr. Donnetta Hutching last evaluated pt on 11-04-16. At that time duplex revealed a patent endarterectomy with mild hyperplasia the distal patch and mid internal carotid artery. Velocities suggestive of 40-59% narrowing bilaterally. Dr. Donnetta Hutching discussed these findings with patient and her daughter present. She was to notify us should she develop any neurologic deficits. Otherwise she was to return in 6 months with repeat carotid duplex. She had normal pedal pulses, Dr. Donnetta Hutching did not suspect any peripheral vascular occlusive disease.   Pt reports occasional left hip pain that radiates to left foot; this happens when she is supine or sitting. Pt has known DDD.    Diabetic: no Tobacco use: smoker  (1-1/2 ppd, started at age 54 yrs)  Pt meds include: Statin : no ASA: yes Other anticoagulants/antiplatelets: no   Past Medical History:  Diagnosis Date  . Anemia   . Anxiety   . Arthritis   .  Carotid artery occlusion   . Chronic kidney disease   . Colon polyps   . COPD (chronic obstructive pulmonary disease) ([place])   . Depression   . Dyspnea   . Heart murmur   . Hypertension   . Memory difficulty   . Pneumonia   . PONV (postoperative nausea and vomiting)    Social History   Socioeconomic History  . Marital status: Widowed    Spouse name: Not on file  . Number of children: 2  . Years of education: Not on file  . Highest education level: Not on file  Occupational History    Comment: retired  Scientific laboratory technician  . Financial resource strain: Not on file  . Food insecurity    Worry: Not on file    Inability: Not on file  . Transportation needs    Medical: Not on file    Non-medical: Not on file  Tobacco Use  . Smoking status: Current Every Day Smoker    Packs/day: 1.00    Years: 50.00    Pack years: 50.00    Types: Cigarettes  . Smokeless tobacco: Never Used  Substance and Sexual Activity  . Alcohol use: No  . Drug use: No  . Sexual activity: Not on file  Lifestyle  . Physical activity    Days per week: Not on file    Minutes per session: Not on file  . Stress: Not on file  Relationships  . Social Herbalist on phone: Not on file    Gets together: Not on file    Attends religious service: Not on  file    Active member of club or organization: Not on file    Attends meetings of clubs or organizations: Not on file    Relationship status: Not on file  . Intimate partner violence    Fear of current or ex partner: Not on file    Emotionally abused: Not on file    Physically abused: Not on file    Forced sexual activity: Not on file  Other Topics Concern  . Not on file  Social History Narrative   Lives alone     Past Surgical History:  Procedure Laterality Date  . CAROTID ENDARTERECTOMY    . CATARACT EXTRACTION W/PHACO Left 01/02/2014   Procedure: CATARACT EXTRACTION PHACO AND INTRAOCULAR LENS PLACEMENT (IOC);  Surgeon: Tonny Branch, MD;   Location: [facility];  Service: Ophthalmology;  Laterality: Left;  CDE 8.17  . CATARACT EXTRACTION W/PHACO Right 03/06/2014   Procedure: CATARACT EXTRACTION PHACO AND INTRAOCULAR LENS PLACEMENT ([place]);  Surgeon: Tonny Branch, MD;  Location: [facility];  Service: Ophthalmology;  Laterality: Right;  CDE:15.14  . CHOLECYSTECTOMY    . COLONOSCOPY  11/13/2011   Procedure: COLONOSCOPY;  Surgeon: Rogene Houston, MD;  Location: [facility];  Service: Endoscopy;  Laterality: N/A;  1030  . COLONOSCOPY N/A 01/22/2017   Procedure: COLONOSCOPY;  Surgeon: Rogene Houston, MD;  [facility]: [facility];  Service: Endoscopy;  Laterality: N/A;  1030  . ENDARTERECTOMY Left 04/02/2016   Procedure: ENDARTERECTOMY LEFT CAROTID ARTERY;  Surgeon: Rosetta Posner, MD;  Location: [facility];  Service: Vascular;  Laterality: Left;  . PATCH ANGIOPLASTY Left 04/02/2016   Procedure: PATCH ANGIOPLASTY LEFT CAROTID ARTERY USING HEMASHIELD PLATINUM FINESS PATCH;  Surgeon: Rosetta Posner, MD;  [facility];  Service: Vascular;  Laterality: Left;    Current Meds  Medication Sig  . aspirin EC 81 MG tablet Take 81 mg by mouth daily.  Marland Kitchen losartan (COZAAR) 100 MG tablet Take 100 mg by mouth every morning.   Marland Kitchen PARoxetine (PAXIL) 40 MG tablet Take 40 mg by mouth every morning.   Allergies  Allergen Reactions  . Augmentin [Amoxicillin-Pot Clavulanate] Other (See Comments)    "SEVERE SWEATS" Has patient had a PCN reaction causing immediate rash, facial/tongue/throat swelling, SOB or lightheadedness with hypotension: No Has patient had a PCN reaction causing severe rash involving mucus membranes or skin necrosis: No Has patient had a PCN reaction that required hospitalization: No Has patient had a PCN reaction occurring within the last 10 years: Yes If all of the above answers are "NO", then may proceed with Cephalosporin use.   . Vicodin [Hydrocodone-Acetaminophen] Nausea And Vomiting  . Quinolones Nausea And Vomiting     12 system ROS was  negative unless otherwise noted in HPI   Observations/Objective: DATA Carotid Duplex (03-28-19): Right ICA: 1-39% stenosis Left ICA: (CEA site) 1-39% stenosis Unable to obtain the 40-59% stenosis bilaterally as in the exam on 11-04-16.  Vertebrals:  Bilateral vertebral arteries demonstrate antegrade flow. Subclavians: Normal flow hemodynamics were seen in bilateral subclavian arteries. No change compared to the exam on 07-07-17.     Assessment and Plan: Sophia Coleman is a 78 y.o. female who is s/p left carotid endarterectomy on 04/02/2016.  She has no history of stroke or TIA.   Today's carotid duplex shows 1-39% bilateral ICA stenosis.   Fortunately she does not have DM but unfortunately she continues to smoke 1/2-1 ppd, started at age 67.  She takes a daily 81 mg ASA,  does not take a statin.   Consider statin therapy which is associated with a greater reduction in CVD risk and improved endothelial function, will defer to pt PCP.   Follow Up Instructions:   Follow up 1 year with carotid duplex.   Over 3 minutes was spent counseling patient re smoking cessation, and patient was given several free resources re smoking cessation.   I discussed the assessment and treatment plan with the patient. The patient was provided an opportunity to ask questions and all were answered. The patient agreed with the plan and demonstrated an understanding of the instructions.   The patient was advised to call back or seek an in-person evaluation if the symptoms worsen or if the condition fails to improve as anticipated.  I spent 9 minutes with the patient via telephone encounter.   Gabrielle Dare Callan [facility] Office: [phone]  03/28/2019, 12:02 PM

## 2019-03-28 NOTE — Telephone Encounter (Signed)
Virtual Visit Pre-Appointment Phone Call  Today, I spoke with Sophia Coleman and performed the following actions:  1. I explained that we are currently trying to limit exposure to the COVID-19 virus by seeing patients at home rather than in the office.  I explained that the visits are best done by video, but can be done by telephone.  I asked the patient if a virtual visit that the patient would like to try instead of coming into the office. Sophia Coleman agreed to proceed with the virtual visit scheduled with Vinnie Level Nickel  on 03/28/19.       I confirmed the BEST phone number to call the day of the visit and- I included this in appointment notes.  2. I asked if the patient had access to (through a family member/friend) a smartphone with video capability to be used for her visit?"  The patient said yes -    3. I confirmed consent by  a. sending through [place] or by email the [place] as written at the end of this message or  b. verbally as listed below. i. This visit is being performed in the setting of COVID-19. ii. All virtual visits are billed to your insurance company just like a normal visit would be.   iii. We'd like you to understand that the technology does not allow for your provider to perform an examination, and thus may limit your provider's ability to fully assess your condition.  iv. If your provider identifies any concerns that need to be evaluated in person, we will make arrangements to do so.   v. Finally, though the technology is pretty good, we cannot assure that it will always work on either your or our end, and in the setting of a video visit, we may have to convert it to a phone-only visit.  In either situation, we cannot ensure that we have a secure connection.   vi. Are you willing to proceed?"  STAFF: Did the patient verbally acknowledge consent to telehealth visit? Document YES/NO here: YES  2. I advised the patient to be  prepared - I asked that the patient, on the day of her visit, record any information possible with the equipment at her home, such as blood pressure, pulse, oxygen saturation, and your weight and write them all down. I asked the patient to have a pen and paper handy nearby the day of the visit as well.  3. If the patient was scheduled for a video visit, I informed the patient that the visit with the doctor would start with a text to the smartphone # given to [place] by the patient.         If the patient was scheduled for a telephone call, I informed the patient that the visit with the doctor would start with a call to the telephone # given to [place] by the patient.  4. I Informed patient they will receive a phone call 15 minutes prior to their appointment time from a [facility] or nurse to review medications, allergies, etc. to prepare for the visit.    TELEPHONE CALL NOTE  Sophia Coleman has been deemed a candidate for a follow-up tele-health visit to limit community exposure during the Covid-19 pandemic. I spoke with the patient via phone to ensure availability of phone/video source, confirm preferred email & phone number, and discuss instructions and expectations.  I reminded Sophia Coleman to be prepared with any vital  sign and/or heart rhythm information that could potentially be obtained via home monitoring, at the time of her visit. I reminded Sophia Coleman to expect a phone call prior to her visit.  Cleaster Corin, NT 03/28/2019 11:57 AM     FULL LENGTH CONSENT FOR TELE-HEALTH VISIT   I hereby voluntarily request, consent and authorize [facility] and its employed or contracted physicians, physician assistants, nurse practitioners or other licensed health care professionals (the Practitioner), to provide me with telemedicine health care services (the "Services") as deemed necessary by the treating Practitioner. I acknowledge and consent to receive the Services by the Practitioner via  telemedicine. I understand that the telemedicine visit will involve communicating with the Practitioner through live audiovisual communication technology and the disclosure of certain medical information by electronic transmission. I acknowledge that I have been given the opportunity to request an in-person assessment or other available alternative prior to the telemedicine visit and am voluntarily participating in the telemedicine visit.  I understand that I have the right to withhold or withdraw my consent to the use of telemedicine in the course of my care at any time, without affecting my right to future care or treatment, and that the Practitioner or I may terminate the telemedicine visit at any time. I understand that I have the right to inspect all information obtained and/or recorded in the course of the telemedicine visit and may receive copies of available information for a reasonable fee.  I understand that some of the potential risks of receiving the Services via telemedicine include:  Marland Kitchen Delay or interruption in medical evaluation due to technological equipment failure or disruption; . Information transmitted may not be sufficient (e.g. poor resolution of images) to allow for appropriate medical decision making by the Practitioner; and/or  . In rare instances, security protocols could fail, causing a breach of personal health information.  Furthermore, I acknowledge that it is my responsibility to provide information about my medical history, conditions and care that is complete and accurate to the best of my ability. I acknowledge that Practitioner's advice, recommendations, and/or decision may be based on factors not within their control, such as incomplete or inaccurate data provided by me or distortions of diagnostic images or specimens that may result from electronic transmissions. I understand that the practice of medicine is not an exact science and that Practitioner makes no warranties or  guarantees regarding treatment outcomes. I acknowledge that I will receive a copy of this consent concurrently upon execution via email to the email address I last provided but may also request a printed copy by calling the office of [facility].    I understand that my insurance will be billed for this visit.   I have read or had this consent read to me. . I understand the contents of this consent, which adequately explains the benefits and risks of the Services being provided via telemedicine.  . I have been provided ample opportunity to ask questions regarding this consent and the Services and have had my questions answered to my satisfaction. . I give my informed consent for the services to be provided through the use of telemedicine in my medical care  By participating in this telemedicine visit I agree to the above.

## 2019-03-31 ENCOUNTER — Other Ambulatory Visit: Payer: Self-pay

## 2019-03-31 DIAGNOSIS — I6523 Occlusion and stenosis of bilateral carotid arteries: Secondary | ICD-10-CM

## 2019-04-11 ENCOUNTER — Ambulatory Visit: Payer: Medicare Other | Admitting: Diagnostic Neuroimaging

## 2019-05-09 ENCOUNTER — Encounter (HOSPITAL_COMMUNITY): Payer: Medicare Other

## 2019-06-01 DIAGNOSIS — J441 Chronic obstructive pulmonary disease with (acute) exacerbation: Secondary | ICD-10-CM | POA: Diagnosis not present

## 2019-06-01 DIAGNOSIS — J069 Acute upper respiratory infection, unspecified: Secondary | ICD-10-CM | POA: Diagnosis not present

## 2019-06-10 ENCOUNTER — Encounter (HOSPITAL_COMMUNITY): Payer: Medicare Other

## 2019-06-10 DIAGNOSIS — Z1231 Encounter for screening mammogram for malignant neoplasm of breast: Secondary | ICD-10-CM

## 2019-06-13 ENCOUNTER — Emergency Department (HOSPITAL_COMMUNITY): Payer: Medicare Other

## 2019-06-13 ENCOUNTER — Encounter (HOSPITAL_COMMUNITY): Payer: Self-pay | Admitting: Emergency Medicine

## 2019-06-13 ENCOUNTER — Emergency Department (HOSPITAL_COMMUNITY)
Admission: EM | Admit: 2019-06-13 | Discharge: 2019-06-13 | Disposition: A | Discharge status: Home / Self Care | Payer: Medicare Other | Attending: Emergency Medicine | Admitting: Emergency Medicine

## 2019-06-13 ENCOUNTER — Other Ambulatory Visit: Payer: Self-pay

## 2019-06-13 DIAGNOSIS — I129 Hypertensive chronic kidney disease with stage 1 through stage 4 chronic kidney disease, or unspecified chronic kidney disease: Secondary | ICD-10-CM | POA: Diagnosis not present

## 2019-06-13 DIAGNOSIS — F17210 Nicotine dependence, cigarettes, uncomplicated: Secondary | ICD-10-CM | POA: Diagnosis not present

## 2019-06-13 DIAGNOSIS — Z79899 Other long term (current) drug therapy: Secondary | ICD-10-CM | POA: Diagnosis not present

## 2019-06-13 DIAGNOSIS — Z20822 Contact with and (suspected) exposure to covid-19: Secondary | ICD-10-CM | POA: Insufficient documentation

## 2019-06-13 DIAGNOSIS — Z7982 Long term (current) use of aspirin: Secondary | ICD-10-CM | POA: Diagnosis not present

## 2019-06-13 DIAGNOSIS — R410 Disorientation, unspecified: Secondary | ICD-10-CM | POA: Diagnosis not present

## 2019-06-13 DIAGNOSIS — J449 Chronic obstructive pulmonary disease, unspecified: Secondary | ICD-10-CM | POA: Insufficient documentation

## 2019-06-13 DIAGNOSIS — R05 Cough: Secondary | ICD-10-CM | POA: Insufficient documentation

## 2019-06-13 DIAGNOSIS — R531 Weakness: Secondary | ICD-10-CM | POA: Diagnosis not present

## 2019-06-13 DIAGNOSIS — G9340 Encephalopathy, unspecified: Secondary | ICD-10-CM | POA: Diagnosis not present

## 2019-06-13 DIAGNOSIS — D649 Anemia, unspecified: Secondary | ICD-10-CM | POA: Insufficient documentation

## 2019-06-13 DIAGNOSIS — N189 Chronic kidney disease, unspecified: Secondary | ICD-10-CM | POA: Insufficient documentation

## 2019-06-13 LAB — CBC WITH DIFFERENTIAL/PLATELET
Abs Immature Granulocytes: 0.03 10*3/uL (ref 0.00–0.07)
Basophils Absolute: 0.1 10*3/uL (ref 0.0–0.1)
Basophils Relative: 1 %
Eosinophils Absolute: 0.1 10*3/uL (ref 0.0–0.5)
Eosinophils Relative: 1 %
HCT: 28.6 % — ABNORMAL LOW (ref 36.0–46.0)
Hemoglobin: 8.5 g/dL — ABNORMAL LOW (ref 12.0–15.0)
Immature Granulocytes: 0 %
Lymphocytes Relative: 14 %
Lymphs Abs: 1.2 10*3/uL (ref 0.7–4.0)
MCH: 25.9 pg — ABNORMAL LOW (ref 26.0–34.0)
MCHC: 29.7 g/dL — ABNORMAL LOW (ref 30.0–36.0)
MCV: 87.2 fL (ref 80.0–100.0)
Monocytes Absolute: 0.6 10*3/uL (ref 0.1–1.0)
Monocytes Relative: 7 %
Neutro Abs: 6.5 10*3/uL (ref 1.7–7.7)
Neutrophils Relative %: 77 %
Platelets: 430 10*3/uL — ABNORMAL HIGH (ref 150–400)
RBC: 3.28 MIL/uL — ABNORMAL LOW (ref 3.87–5.11)
RDW: 14.2 % (ref 11.5–15.5)
WBC: 8.4 10*3/uL (ref 4.0–10.5)
nRBC: 0 % (ref 0.0–0.2)

## 2019-06-13 LAB — COMPREHENSIVE METABOLIC PANEL
ALT: 20 U/L (ref 0–44)
AST: 21 U/L (ref 15–41)
Albumin: 4.2 g/dL (ref 3.5–5.0)
Alkaline Phosphatase: 75 U/L (ref 38–126)
Anion gap: 8 (ref 5–15)
BUN: 23 mg/dL (ref 8–23)
CO2: 23 mmol/L (ref 22–32)
Calcium: 9.9 mg/dL (ref 8.9–10.3)
Chloride: 105 mmol/L (ref 98–111)
Creatinine, Ser: 0.85 mg/dL (ref 0.44–1.00)
GFR calc Af Amer: >60 mL/min (ref >60)
GFR calc non Af Amer: >60 mL/min (ref >60)
Glucose, Bld: 101 mg/dL — ABNORMAL HIGH (ref 70–99)
Potassium: 4.1 mmol/L (ref 3.5–5.1)
Sodium: 136 mmol/L (ref 135–145)
Total Bilirubin: 0.5 mg/dL (ref 0.3–1.2)
Total Protein: 8.2 g/dL — ABNORMAL HIGH (ref 6.5–8.1)

## 2019-06-13 LAB — URINALYSIS, ROUTINE W REFLEX MICROSCOPIC
Bilirubin Urine: NEGATIVE
Glucose, UA: NEGATIVE mg/dL
Hgb urine dipstick: NEGATIVE
Ketones, ur: NEGATIVE mg/dL
Leukocytes,Ua: NEGATIVE
Nitrite: NEGATIVE
Protein, ur: NEGATIVE mg/dL
Specific Gravity, Urine: 1.015 (ref 1.005–1.030)
pH: 7 (ref 5.0–8.0)

## 2019-06-13 LAB — ETHANOL: Alcohol, Ethyl (B): <10 mg/dL (ref <10)

## 2019-06-13 LAB — TROPONIN I (HIGH SENSITIVITY): Troponin I (High Sensitivity): 4 ng/L (ref <18)

## 2019-06-13 LAB — POC SARS CORONAVIRUS 2 AG -  ED: SARS Coronavirus 2 Ag: NEGATIVE

## 2019-06-13 LAB — CBG MONITORING, ED: Glucose-Capillary: 96 mg/dL (ref 70–99)

## 2019-06-13 MED ORDER — FERROUS SULFATE 325 (65 FE) MG PO TABS: 325.0000 mg | ORAL_TABLET | Freq: Every day | ORAL | 0 refills | Status: AC

## 2019-06-13 MED ORDER — SODIUM CHLORIDE 0.9 % IV BOLUS
1000.0000 mL | Freq: Once | INTRAVENOUS | Status: AC
  Administered 2019-06-13: 14:00:00 1000 mL via INTRAVENOUS

## 2019-06-13 NOTE — ED Triage Notes (Signed)
Pt reports fatigue, no energy, weakness, intermittent confusion, dizziness for last several days. Pt denies any gi/gu symptoms, fever.

## 2019-06-13 NOTE — ED Notes (Signed)
Spoke with patients daughter about her condition and intermittent confusion. Daughter stated that she had been recently diagnosed with new onset dementia and has been having more episodes of confusion and forgetfulness but is still able to complete ADLs and live alone. Patient oriented to person and place.

## 2019-06-13 NOTE — Discharge Instructions (Signed)
It was recommended you stay in the hospital for further evaluation of your low hemoglobin.  Today it is 8.5.  Is very important to follow-up with your doctor in the next 2 days for recheck.  Start taking iron.  If you notice any bleeding or any new/worsening symptoms then return to the ER for evaluation.

## 2019-06-13 NOTE — ED Provider Notes (Signed)
[facility] EMERGENCY DEPARTMENT Provider Note   CSN: [id number] Arrival date & time: 06/13/19  1034     History Chief Complaint  Patient presents with  . Weakness    Sophia Coleman is a 79 y.o. female.  HPI 79 year old female presents with feeling generally weak.  She is vague about her symptoms.  States she had a cough like a cold about a week ago though that is improved.  She wonders if it is in her chest.  She denies any new shortness of breath.  Denies any fevers or headaches.  When asked regarding triage note indicating she feels confused she states she has been feeling more confused for the past week or so but cannot explain exactly what this means.  No pain such as chest pain or abdominal pain.  I discussed with the daughter Lynelle Smoke, over the phone.  She states the patient has been complaining of normal feeling in her head, perhaps lightheadedness, and fatigue for about a week.  The memory issues including disorientation has been a progressive worsening for months.   Past Medical History:  Diagnosis Date  . Anemia   . Anxiety   . Arthritis   . Carotid artery occlusion   . Chronic kidney disease   . Colon polyps   . COPD (chronic obstructive pulmonary disease) ([place])   . Depression   . Dyspnea   . Heart murmur   . Hypertension   . Memory difficulty   . Pneumonia   . PONV (postoperative nausea and vomiting)     Patient Active Problem List   Diagnosis Date Noted  . Iron deficiency anemia due to chronic blood loss 04/22/2017  . History of colonic polyps 09/18/2016  . Carotid stenosis 04/02/2016    Past Surgical History:  Procedure Laterality Date  . CAROTID ENDARTERECTOMY    . CATARACT EXTRACTION W/PHACO Left 01/02/2014   Procedure: CATARACT EXTRACTION PHACO AND INTRAOCULAR LENS PLACEMENT (IOC);  Surgeon: Tonny Branch, MD;  Location: [facility] OR[facility];  Service: Ophthalmology;  Laterality: Left;  CDE 8.17  . CATARACT EXTRACTION W/PHACO Right 03/06/2014   Procedure: CATARACT  EXTRACTION PHACO AND INTRAOCULAR LENS PLACEMENT ([place]);  Surgeon: Tonny Branch, MD;  Location: [facility];  Service: Ophthalmology;  Laterality: Right;  CDE:15.14  . CHOLECYSTECTOMY    . COLONOSCOPY  11/13/2011   Procedure: COLONOSCOPY;  Surgeon: Rogene Houston, MD;  Location: [facility] END[facility] SUITE;  Service: Endoscopy;  Laterality: N/A;  1030  . COLONOSCOPY N/A 01/22/2017   Procedure: COLONOSCOPY;  Surgeon: Rogene Houston, MD;  Location: [facility];  Service: Endoscopy;  Laterality: N/A;  1030  . ENDARTERECTOMY Left 04/02/2016   Procedure: ENDARTERECTOMY LEFT CAROTID ARTERY;  Surgeon: Rosetta Posner, MD;  Location: [facility];  Service: Vascular;  Laterality: Left;  . PATCH ANGIOPLASTY Left 04/02/2016   Procedure: PATCH ANGIOPLASTY LEFT CAROTID ARTERY USING HEMASHIELD PLATINUM FINESS PATCH;  Surgeon: Rosetta Posner, MD;  Location: [facility] OR;  Service: Vascular;  Laterality: Left;     OB History   No obstetric history on file.     Family History  Problem Relation Age of Onset  . Heart murmur Mother   . Heart Problems Mother   . Heart disease Mother   . Hypertension Mother   . Glaucoma Mother   . Emphysema Father        COPD  . Cancer Sister   . Dementia Brother   . Stroke Maternal Grandmother   . Heart attack Maternal Grandfather   .  Kidney disease Paternal Grandfather   . Colon cancer Neg Hx     Social History   Tobacco Use  . Smoking status: Current Every Day Smoker    Packs/day: 1.00    Years: 50.00    Pack years: 50.00    Types: Cigarettes  . Smokeless tobacco: Never Used  Substance Use Topics  . Alcohol use: No  . Drug use: No    Home Medications Prior to Admission medications   Medication Sig Start Date End Date Taking? Authorizing Provider  aspirin EC 81 MG tablet Take 81 mg by mouth daily.   Yes [provider]  losartan (COZAAR) 100 MG tablet Take 100 mg by mouth every morning.    Yes [provider]  PARoxetine (PAXIL) 40 MG tablet Take 40 mg by mouth every  morning.   Yes [provider]  azithromycin (ZITHROMAX) 250 MG tablet Take 2 tablets on day1, then 1 tablet for day2-day5. 06/01/19   [provider]  donepezil (ARICEPT) 5 MG tablet Take 5 mg by mouth at bedtime. 03/10/19   [provider]  ferrous sulfate 325 (65 FE) MG tablet Take 1 tablet (325 mg total) by mouth daily. 06/13/19   Sherwood Gambler, MD  predniSONE (STERAPRED UNI-PAK 21 TAB) 5 MG (21) TBPK tablet TAKE AS DIRECTED PERLPACKAGE INSTRUCTIONS. 06/01/19   [provider]    Allergies    Vicodin [hydrocodone-acetaminophen], Augmentin [amoxicillin-pot clavulanate], and Quinolones  Review of Systems   Review of Systems  Constitutional: Negative for fever.  Respiratory: Positive for cough. Negative for shortness of breath.   Cardiovascular: Negative for chest pain.  Gastrointestinal: Negative for vomiting.  Genitourinary: Negative for dysuria.  Neurological: Negative for headaches.  Psychiatric/Behavioral: Positive for confusion.  All other systems reviewed and are negative.   Physical Exam Updated Vital Signs BP (!) 180/64 (BP Location: Right Arm)   Pulse 72   Temp 98 F (36.7 C) (Oral)   Resp 18   Ht 5' 1.5" (1.562 m)   Wt 45 kg   SpO2 99%   BMI 18.44 kg/m   Physical Exam Vitals and nursing note reviewed.  Constitutional:      General: She is not in acute distress.    Appearance: She is well-developed. She is not ill-appearing or diaphoretic.  HENT:     Head: Normocephalic and atraumatic.     Right Ear: External ear normal.     Left Ear: External ear normal.     Nose: Nose normal.  Eyes:     General:        Right eye: No discharge.        Left eye: No discharge.     Extraocular Movements: Extraocular movements intact.     Pupils: Pupils are equal, round, and reactive to light.  Cardiovascular:     Rate and Rhythm: Normal rate and regular rhythm.     Heart sounds: Normal heart sounds.  Pulmonary:     Effort: Pulmonary  effort is normal.     Breath sounds: Normal breath sounds.  Abdominal:     Palpations: Abdomen is soft.     Tenderness: There is no abdominal tenderness.  Skin:    General: Skin is warm and dry.  Neurological:     Mental Status: She is alert. She is disoriented.     Comments: Awake, alert, oriented to person and place. Disoriented to time, does not know president, month or year. CN 3-12 grossly intact. 5/5 strength in all 4  extremities. Grossly normal sensation. Normal finger to nose.   Psychiatric:        Mood and Affect: Mood is not anxious.     ED Results / Procedures / Treatments   Labs (all labs ordered are listed, but only abnormal results are displayed) Labs Reviewed  URINALYSIS, ROUTINE W REFLEX MICROSCOPIC - Abnormal; Notable for the following components:      Result Value   APPearance HAZY (*)    All other components within normal limits  COMPREHENSIVE METABOLIC PANEL - Abnormal; Notable for the following components:   Glucose, Bld 101 (*)    Total Protein 8.2 (*)    All other components within normal limits  CBC WITH DIFFERENTIAL/PLATELET - Abnormal; Notable for the following components:   RBC 3.28 (*)    Hemoglobin 8.5 (*)    HCT 28.6 (*)    MCH 25.9 (*)    MCHC 29.7 (*)    Platelets 430 (*)    All other components within normal limits  URINE CULTURE  NOVEL CORONAVIRUS, NAA (HOSP ORDER, SEND-OUT TO REF LAB; TAT 18-24 HRS)  ETHANOL  CBG MONITORING, ED  POC SARS CORONAVIRUS 2 AG -  ED  POC OCCULT BLOOD, ED  TROPONIN I (HIGH SENSITIVITY)    EKG EKG Interpretation  Date/Time:  Monday June 13 2019 11:17:08 EST Ventricular Rate:  71 PR Interval:  118 QRS Duration: 78 QT Interval:  394 QTC Calculation: 428 R Axis:   86 Text Interpretation: Normal sinus rhythm Anteroseptal infarct , age undetermined Abnormal ECG no significant change since April 2020 Confirmed by Sherwood Gambler [phone]) on 06/13/2019 12:44:16 PM   Radiology CT Head Wo Contrast  Result  Date: 06/13/2019 CLINICAL DATA:  Encephalopathy.  New onset dementia.  Confusion. EXAM: CT HEAD WITHOUT CONTRAST TECHNIQUE: Contiguous axial images were obtained from the base of the skull through the vertex without intravenous contrast. COMPARISON:  September 03, 2018 FINDINGS: Brain: No subdural, epidural, or subarachnoid hemorrhage. Ventricles and sulci are stable. Cerebellum, brainstem, and basal cisterns are normal. Mild white matter changes. No acute cortical ischemia or infarct. No mass effect or midline shift. Vascular: Calcified atherosclerosis is seen in the intracranial carotids. Skull: Normal. Negative for fracture or focal lesion. Sinuses/Orbits: No acute finding. Other: None. IMPRESSION: No acute intracranial abnormalities. Electronically Signed   By: Dorise Bullion III M.D   On: 06/13/2019 14:51   DG Chest Portable 1 View  Result Date: 06/13/2019 CLINICAL DATA:  Acute mental status change. Cough. EXAM: PORTABLE CHEST 1 VIEW COMPARISON:  June 01, 2019 FINDINGS: No pneumothorax. Nipple shadows are seen bilaterally. The heart, hila, and mediastinum are normal. Hyperinflation of the lungs is identified consistent with emphysema or COPD. No focal infiltrate or overt edema. IMPRESSION: Hyperinflation of the lungs consistent with emphysema/COPD. No acute abnormalities are identified. Electronically Signed   By: Dorise Bullion III M.D   On: 06/13/2019 13:55    Procedures Procedures (including critical care time)  Medications Ordered in ED Medications  sodium chloride 0.9 % bolus 1,000 mL (0 mLs Intravenous Stopped 06/13/19 1535)    ED Course  I have reviewed the triage vital signs and the nursing notes.  Pertinent labs & imaging results that were available during my care of the patient were reviewed by me and considered in my medical decision making (see chart for details).    MDM Rules/Calculators/A&P                      Patient's  confusion appears consistent with her chronic decline  and is not really a new thing.  Work-up is largely unremarkable, however she is found to have a hemoglobin of 8.  Likely this is the cause of her fatigue and what sounds like lightheadedness.  I did a rectal exam with nurse chaperone and there is no gross blood.  Occult shows no obvious occult blood.  I discussed further with her daughter Lynelle Smoke, who is okay with patient coming home.  Patient is adamant she does not want to stay in the hospital.  I think this is probably a subacute problem and think it is reasonable given she has follow-up with her PCP in 2 days.  Advised recheck hemoglobin then.  Otherwise, appears stable for discharge home with strict return precautions. Final Clinical Impression(s) / ED Diagnoses Final diagnoses:  Symptomatic anemia    Rx / DC Orders ED Discharge Orders         Ordered    ferrous sulfate 325 (65 FE) MG tablet  Daily     06/13/19 1518           Sherwood Gambler, MD 06/13/19 1750

## 2019-06-15 ENCOUNTER — Encounter (HOSPITAL_COMMUNITY): Payer: Self-pay

## 2019-06-15 ENCOUNTER — Emergency Department (HOSPITAL_COMMUNITY)
Admission: EM | Admit: 2019-06-15 | Discharge: 2019-06-15 | Disposition: A | Discharge status: Home / Self Care | Payer: Medicare Other | Attending: Emergency Medicine | Admitting: Emergency Medicine

## 2019-06-15 ENCOUNTER — Other Ambulatory Visit: Payer: Self-pay

## 2019-06-15 DIAGNOSIS — D649 Anemia, unspecified: Secondary | ICD-10-CM | POA: Diagnosis not present

## 2019-06-15 DIAGNOSIS — I129 Hypertensive chronic kidney disease with stage 1 through stage 4 chronic kidney disease, or unspecified chronic kidney disease: Secondary | ICD-10-CM | POA: Insufficient documentation

## 2019-06-15 DIAGNOSIS — D631 Anemia in chronic kidney disease: Secondary | ICD-10-CM | POA: Insufficient documentation

## 2019-06-15 DIAGNOSIS — J449 Chronic obstructive pulmonary disease, unspecified: Secondary | ICD-10-CM | POA: Insufficient documentation

## 2019-06-15 DIAGNOSIS — Z79899 Other long term (current) drug therapy: Secondary | ICD-10-CM | POA: Diagnosis not present

## 2019-06-15 DIAGNOSIS — Z7982 Long term (current) use of aspirin: Secondary | ICD-10-CM | POA: Insufficient documentation

## 2019-06-15 DIAGNOSIS — F17210 Nicotine dependence, cigarettes, uncomplicated: Secondary | ICD-10-CM | POA: Diagnosis not present

## 2019-06-15 DIAGNOSIS — N189 Chronic kidney disease, unspecified: Secondary | ICD-10-CM | POA: Diagnosis not present

## 2019-06-15 DIAGNOSIS — D509 Iron deficiency anemia, unspecified: Secondary | ICD-10-CM | POA: Diagnosis not present

## 2019-06-15 DIAGNOSIS — Z681 Body mass index (BMI) 19 or less, adult: Secondary | ICD-10-CM | POA: Diagnosis not present

## 2019-06-15 LAB — BASIC METABOLIC PANEL
Anion gap: 6 (ref 5–15)
BUN: 26 mg/dL — ABNORMAL HIGH (ref 8–23)
CO2: 26 mmol/L (ref 22–32)
Calcium: 9.6 mg/dL (ref 8.9–10.3)
Chloride: 103 mmol/L (ref 98–111)
Creatinine, Ser: 0.84 mg/dL (ref 0.44–1.00)
GFR calc Af Amer: >60 mL/min (ref >60)
GFR calc non Af Amer: >60 mL/min (ref >60)
Glucose, Bld: 87 mg/dL (ref 70–99)
Potassium: 4.3 mmol/L (ref 3.5–5.1)
Sodium: 135 mmol/L (ref 135–145)

## 2019-06-15 LAB — TYPE AND SCREEN
ABO/RH(D): O POS
Antibody Screen: NEGATIVE

## 2019-06-15 LAB — CBC
HCT: 26.1 % — ABNORMAL LOW (ref 36.0–46.0)
Hemoglobin: 7.7 g/dL — ABNORMAL LOW (ref 12.0–15.0)
MCH: 26.2 pg (ref 26.0–34.0)
MCHC: 29.5 g/dL — ABNORMAL LOW (ref 30.0–36.0)
MCV: 88.8 fL (ref 80.0–100.0)
Platelets: 406 10*3/uL — ABNORMAL HIGH (ref 150–400)
RBC: 2.94 MIL/uL — ABNORMAL LOW (ref 3.87–5.11)
RDW: 14.6 % (ref 11.5–15.5)
WBC: 8 10*3/uL (ref 4.0–10.5)
nRBC: 0 % (ref 0.0–0.2)

## 2019-06-15 LAB — URINE CULTURE

## 2019-06-15 NOTE — Discharge Instructions (Addendum)
Hemoglobin here tonight 7.7.  Transfusion threshold without active bleeding or significant symptoms and is now hemoglobin below 7.  Follow-up with your primary care provider.  They can continue to monitor.  Lab results provided.  Return for any new or worse symptoms.

## 2019-06-15 NOTE — ED Triage Notes (Signed)
Pt here for low hemoglobin. Per PCP, patient's hemoglobin is 7.5. Pt denies dizziness and lightheadedness.

## 2019-06-15 NOTE — ED Provider Notes (Signed)
[facility] EMERGENCY DEPARTMENT Provider Note   CSN: [id number] Arrival date & time: 06/15/19  1710     History Chief Complaint  Patient presents with  . Abnormal Lab    Sophia Coleman is a 79 y.o. female.  Patient sent in from primary care doctor for hemoglobin of 7.5.  Patient was here on January 18 had a hemoglobin of 8.  Is currently being treated by iron.  Patient denies any significant symptoms other than feeling fatigued.  No significant shortness of breath no feeling like she is got a pass out.  No black bowel movements.  No red blood in her bowel movements.  When she was here on the 18th she had a Hemoccult done which was negative.  Informed family that and currently we do not only transfuse unless hemoglobin is below 7 so we will recheck her labs.  Patient is not on blood thinners.        Past Medical History:  Diagnosis Date  . Anemia   . Anxiety   . Arthritis   . Carotid artery occlusion   . Chronic kidney disease   . Colon polyps   . COPD (chronic obstructive pulmonary disease) ([place])   . Depression   . Dyspnea   . Heart murmur   . Hypertension   . Memory difficulty   . Pneumonia   . PONV (postoperative nausea and vomiting)     Patient Active Problem List   Diagnosis Date Noted  . Iron deficiency anemia due to chronic blood loss 04/22/2017  . History of colonic polyps 09/18/2016  . Carotid stenosis 04/02/2016    Past Surgical History:  Procedure Laterality Date  . CAROTID ENDARTERECTOMY    . CATARACT EXTRACTION W/PHACO Left 01/02/2014   Procedure: CATARACT EXTRACTION PHACO AND INTRAOCULAR LENS PLACEMENT (IOC);  Surgeon: Tonny Branch, MD;  Location: [facility];  Service: Ophthalmology;  Laterality: Left;  CDE 8.17  . CATARACT EXTRACTION W/PHACO Right 03/06/2014   Procedure: CATARACT EXTRACTION PHACO AND INTRAOCULAR LENS PLACEMENT ([place]);  Surgeon: Tonny Branch, MD;  Location: [facility];  Service: Ophthalmology;  Laterality: Right;  CDE:15.14  . CHOLECYSTECTOMY     . COLONOSCOPY  11/13/2011   Procedure: COLONOSCOPY;  Surgeon: Rogene Houston, MD;  [facility];  Service: Endoscopy;  Laterality: N/A;  1030  . COLONOSCOPY N/A 01/22/2017   Procedure: COLONOSCOPY;  Surgeon: Rogene Houston, MD;  [facility];  Service: Endoscopy;  Laterality: N/A;  1030  . ENDARTERECTOMY Left 04/02/2016   Procedure: ENDARTERECTOMY LEFT CAROTID ARTERY;  Surgeon: Rosetta Posner, MD;  Location: [place];  Service: Vascular;  Laterality: Left;  . PATCH ANGIOPLASTY Left 04/02/2016   Procedure: PATCH ANGIOPLASTY LEFT CAROTID ARTERY USING HEMASHIELD PLATINUM FINESS PATCH;  Surgeon: Rosetta Posner, MD;  [facility];  Service: Vascular;  Laterality: Left;     OB History   No obstetric history on file.     Family History  Problem Relation Age of Onset  . Heart murmur Mother   . Heart Problems Mother   . Heart disease Mother   . Hypertension Mother   . Glaucoma Mother   . Emphysema Father        COPD  . Cancer Sister   . Dementia Brother   . Stroke Maternal Grandmother   . Heart attack Maternal Grandfather   . Kidney disease Paternal Grandfather   . Colon cancer Neg Hx     Social History   Tobacco Use  .  Smoking status: Current Every Day Smoker    Packs/day: 1.00    Years: 50.00    Pack years: 50.00    Types: Cigarettes  . Smokeless tobacco: Never Used  Substance Use Topics  . Alcohol use: No  . Drug use: No    Home Medications Prior to Admission medications   Medication Sig Start Date End Date Taking? Authorizing Provider  aspirin EC 81 MG tablet Take 81 mg by mouth daily.    [provider]  azithromycin (ZITHROMAX) 250 MG tablet Take 2 tablets on day1, then 1 tablet for day2-day5. 06/01/19   [provider]  donepezil (ARICEPT) 5 MG tablet Take 5 mg by mouth at bedtime. 03/10/19   [provider]  ferrous sulfate 325 (65 FE) MG tablet Take 1 tablet (325 mg total) by mouth daily. 06/13/19   Sherwood Gambler, MD    losartan (COZAAR) 100 MG tablet Take 100 mg by mouth every morning.     [provider]  PARoxetine (PAXIL) 40 MG tablet Take 40 mg by mouth every morning.    [provider]  predniSONE (STERAPRED UNI-PAK 21 TAB) 5 MG (21) TBPK tablet TAKE AS DIRECTED PERLPACKAGE INSTRUCTIONS. 06/01/19   [provider]    Allergies    Vicodin [hydrocodone-acetaminophen], Augmentin [amoxicillin-pot clavulanate], and Quinolones  Review of Systems   Review of Systems  Constitutional: Positive for fatigue. Negative for chills and fever.  HENT: Negative for rhinorrhea and sore throat.   Eyes: Negative for visual disturbance.  Respiratory: Negative for cough and shortness of breath.   Cardiovascular: Negative for chest pain and leg swelling.  Gastrointestinal: Negative for abdominal pain, blood in stool, diarrhea, nausea and vomiting.  Genitourinary: Negative for dysuria.  Musculoskeletal: Negative for back pain and neck pain.  Skin: Negative for rash.  Neurological: Negative for dizziness, light-headedness and headaches.  Hematological: Does not bruise/bleed easily.  Psychiatric/Behavioral: Negative for confusion.    Physical Exam Updated Vital Signs BP 138/67 (BP Location: Right Arm)   Pulse 67   Temp 98.1 F (36.7 C) (Temporal)   Resp 18   Ht 1.549 m (5\' 1" )   Wt 43.1 kg   SpO2 100%   BMI 17.95 kg/m   Physical Exam Vitals and nursing note reviewed.  Constitutional:      General: She is not in acute distress.    Appearance: Normal appearance. She is well-developed.  HENT:     Head: Normocephalic and atraumatic.  Eyes:     Extraocular Movements: Extraocular movements intact.     Conjunctiva/sclera: Conjunctivae normal.     Pupils: Pupils are equal, round, and reactive to light.  Cardiovascular:     Rate and Rhythm: Normal rate and regular rhythm.     Heart sounds: No murmur.  Pulmonary:     Effort: Pulmonary effort is normal. No respiratory distress.      Breath sounds: Normal breath sounds.  Abdominal:     Palpations: Abdomen is soft.     Tenderness: There is no abdominal tenderness.  Musculoskeletal:        General: Normal range of motion.     Cervical back: Normal range of motion and neck supple.  Skin:    General: Skin is warm and dry.     Capillary Refill: Capillary refill takes less than 2 seconds.  Neurological:     General: No focal deficit present.     Mental Status: She is alert.     ED Results / Procedures /  Treatments   Labs (all labs ordered are listed, but only abnormal results are displayed) Labs Reviewed  CBC - Abnormal; Notable for the following components:      Result Value   RBC 2.94 (*)    Hemoglobin 7.7 (*)    HCT 26.1 (*)    MCHC 29.5 (*)    Platelets 406 (*)    All other components within normal limits  BASIC METABOLIC PANEL - Abnormal; Notable for the following components:   BUN 26 (*)    All other components within normal limits  TYPE AND SCREEN    EKG None  Radiology No results found.  Procedures Procedures (including critical care time)  Medications Ordered in ED Medications - No data to display  ED Course  I have reviewed the triage vital signs and the nursing notes.  Pertinent labs & imaging results that were available during my care of the patient were reviewed by me and considered in my medical decision making (see chart for details).    MDM Rules/Calculators/A&P                      Patient hemoglobin here 7.7.  Vital signs are stable.  Patient without any active bleeding.  No significant symptoms.  Does not be criteria for blood transfusion as an inpatient.  Recommend following back up with primary care doctor for recheck of hemoglobin.  Also there is a consideration where she could be arranged to have an outpatient transfusion arranged as poor the desires of the primary care doctor.      Final Clinical Impression(s) / ED Diagnoses Final diagnoses:  Anemia, unspecified  type    Rx / DC Orders ED Discharge Orders    None       Fredia Sorrow, MD 06/15/19 2253

## 2019-06-23 ENCOUNTER — Ambulatory Visit (INDEPENDENT_AMBULATORY_CARE_PROVIDER_SITE_OTHER): Payer: Medicare Other | Admitting: Gastroenterology

## 2019-06-23 ENCOUNTER — Encounter (INDEPENDENT_AMBULATORY_CARE_PROVIDER_SITE_OTHER): Payer: Self-pay | Admitting: Gastroenterology

## 2019-06-23 ENCOUNTER — Other Ambulatory Visit: Payer: Self-pay

## 2019-06-23 VITALS — BP 175/84 | HR 82 | Temp 97.6°F | Ht 65.0 in | Wt 92.0 lb

## 2019-06-23 DIAGNOSIS — D509 Iron deficiency anemia, unspecified: Secondary | ICD-10-CM

## 2019-06-23 NOTE — Progress Notes (Signed)
Patient profile: Sophia Coleman is a 79 y.o. female seen for evaluation of anemia.  Last seen in clinic 2018.  History of Present Illness: Sophia Coleman is seen today for for evaluation of anemia, she denies any obvious GI bleeding or dark stools.  Unsure exactly when the dizziness or weakness started but states she felt okay around Christmas time and has become may be progressively dizzy over the past few weeks.  Worse when she is standing.  She denies any specific GI symptoms.  She has no nausea vomiting.  She has very rare GERD symptoms less than once a week if any trigger foods.  She has no dysphagia.  Reports has a good appetite.  She usually has a bowel movement every day.  She denies rectal bleeding melena or dark stools.  No lower abdominal pain.  Denies constipation or diarrhea.  She is accompanied by her daughter-in-law today who helps with history.  Daughter-in-law notes that they realize about 10 days ago the baby aspirin she was supposed to be on was actually 325 mg aspirin.  She denies other NSAID use.  She denies alcohol.  She does smoke about three fourths a pack a day.  Daughter-in-law Sophia Coleman accompanies helps with history, pt has some evidence of memory impairment. Has weight loss as below but denies feeling she is loosing weight or early satiety, etc    Wt Readings from Last 3 Encounters:  06/23/19 92 lb (41.7 kg)  06/15/19 95 lb (43.1 kg)  06/13/19 99 lb 3.3 oz (45 kg)     Last Colonoscopy: Findings: 2018 The perianal and digital rectal examinations were normal. Five small and medium-sized angiodysplastic lesions without bleeding were found in the cecum. A few small-mouthed diverticula were found in the sigmoid colon. External hemorrhoids were found during retroflexion. The hemorrhoids were small   Last Endoscopy: unable to find on file   Past Medical History:  Past Medical History:  Diagnosis Date  . Anemia   . Anxiety   . Arthritis   . Carotid artery  occlusion   . Chronic kidney disease   . Colon polyps   . COPD (chronic obstructive pulmonary disease) ([place])   . Depression   . Dyspnea   . Heart murmur   . Hypertension   . Memory difficulty   . Pneumonia   . PONV (postoperative nausea and vomiting)     Problem List: Patient Active Problem List   Diagnosis Date Noted  . Iron deficiency anemia due to chronic blood loss 04/22/2017  . History of colonic polyps 09/18/2016  . Carotid stenosis 04/02/2016    Past Surgical History: Past Surgical History:  Procedure Laterality Date  . CAROTID ENDARTERECTOMY    . CATARACT EXTRACTION W/PHACO Left 01/02/2014   Procedure: CATARACT EXTRACTION PHACO AND INTRAOCULAR LENS PLACEMENT (IOC);  Surgeon: Tonny Branch, MD;  Location: [facility];  Service: Ophthalmology;  Laterality: Left;  CDE 8.17  . CATARACT EXTRACTION W/PHACO Right 03/06/2014   Procedure: CATARACT EXTRACTION PHACO AND INTRAOCULAR LENS PLACEMENT ([place]);  Surgeon: Tonny Branch, MD;  Location: [facility];  Service: Ophthalmology;  Laterality: Right;  CDE:15.14  . CHOLECYSTECTOMY    . COLONOSCOPY  11/13/2011   Procedure: COLONOSCOPY;  Surgeon: Rogene Houston, MD;  [facility];  Service: Endoscopy;  Laterality: N/A;  1030  . COLONOSCOPY N/A 01/22/2017   Procedure: COLONOSCOPY;  Surgeon: Rogene Houston, MD;  [facility];  Service: Endoscopy;  Laterality: N/A;  1030  . ENDARTERECTOMY  Left 04/02/2016   Procedure: ENDARTERECTOMY LEFT CAROTID ARTERY;  Surgeon: Rosetta Posner, MD;  Location: [place];  Service: Vascular;  Laterality: Left;  . PATCH ANGIOPLASTY Left 04/02/2016   Procedure: PATCH ANGIOPLASTY LEFT CAROTID ARTERY USING HEMASHIELD PLATINUM FINESS PATCH;  Surgeon: Rosetta Posner, MD;  Location: [place];  Service: Vascular;  Laterality: Left;    Allergies: Allergies  Allergen Reactions  . Vicodin [Hydrocodone-Acetaminophen] Nausea And Vomiting  . Augmentin [Amoxicillin-Pot Clavulanate] Other (See Comments)    "SEVERE  SWEATS" Has patient had a PCN reaction causing immediate rash, facial/tongue/throat swelling, SOB or lightheadedness with hypotension: No Has patient had a PCN reaction causing severe rash involving mucus membranes or skin necrosis: No Has patient had a PCN reaction that required hospitalization: No Has patient had a PCN reaction occurring within the last 10 years: Yes If all of the above answers are "NO", then may proceed with Cephalosporin use.   . Quinolones Nausea And Vomiting      Home Medications:  Current Outpatient Medications:  .  azithromycin (ZITHROMAX) 250 MG tablet, Take 2 tablets on day1, then 1 tablet for day2-day5., Disp: , Rfl:  .  ferrous sulfate 325 (65 FE) MG tablet, Take 1 tablet (325 mg total) by mouth daily., Disp: 30 tablet, Rfl: 0 .  losartan (COZAAR) 100 MG tablet, Take 100 mg by mouth every morning. , Disp: , Rfl:  .  PARoxetine (PAXIL) 40 MG tablet, Take 40 mg by mouth every morning., Disp: , Rfl:  .  aspirin EC 81 MG tablet, Take 81 mg by mouth daily., Disp: , Rfl:  .  donepezil (ARICEPT) 5 MG tablet, Take 5 mg by mouth at bedtime., Disp: , Rfl:  .  predniSONE (STERAPRED UNI-PAK 21 TAB) 5 MG (21) TBPK tablet, TAKE AS DIRECTED PERLPACKAGE INSTRUCTIONS., Disp: , Rfl:    Family History: family history includes Cancer in her sister; Dementia in her brother; Emphysema in her father; Glaucoma in her mother; Heart Problems in her mother; Heart attack in her maternal grandfather; Heart disease in her mother; Heart murmur in her mother; Hypertension in her mother; Kidney disease in her paternal grandfather; Lung cancer in her sister; Stroke in her maternal grandmother.    Social History:   reports that she has been smoking cigarettes. She has a 50.00 pack-year smoking history. She has never used smokeless tobacco. She reports that she does not drink alcohol or use drugs.   Review of Systems: Constitutional: + Dizzy and weak  eyes: No changes in vision. ENT: No oral  lesions, sore throat.  GI: see HPI.  Heme/Lymph: No easy bruising.  CV: No chest pain.  GU: No hematuria.  Integumentary: No rashes.  Neuro: No headaches.  Psych: No depression/anxiety.  Endocrine: No heat/cold intolerance.  Allergic/Immunologic: No urticaria.  Resp: No cough, SOB.  Musculoskeletal: No joint swelling.    Physical Examination: BP (!) 175/84 (BP Location: Right Arm, Patient Position: Sitting, Cuff Size: Small)   Pulse 82   Temp 97.6 F (36.4 C) (Temporal)   Ht 5\' 5"  (1.651 m)   Wt 92 lb (41.7 kg)   BMI 15.31 kg/m   Pt will repeat BP at home. To notify pcp if stays elevated Gen: NAD, alert and oriented x 4 HEENT: PEERLA, EOMI, Neck: supple, no JVD Chest: CTA bilaterally, no wheezes, crackles, or other adventitious sounds CV: RRR, no m/g/c/r Abd: soft, NT, ND, +BS in all four quadrants; no HSM, guarding, ridigity, or rebound tenderness Rectal-brown stool in rectal  vault, Hemoccult negative Ext: no edema, well perfused with 2+ pulses, Skin: + rash on back, unilateral erythematous, per patient itches/burns, consider shingles, to see pcp Lymph: no noted LAD  Data Reviewed:  06/15/19--CBC w/ Hgb 7.7, MCV 88 (ER labs, Hgb on PCP labs that day was 7.5)  06/13/19--CBC w/ Hgb 8.5, MCV 87, platelet 430, per notes occult negative    Comparison CBC 08/2018--Hgb 12.9   Assessment/Plan: Ms. Gacia is a 79 y.o. female seen today for anemia.  1.  Anemia--Hemoccult negative both in the emergency room and in our office.  She denies obvious GI bleeding.  She does have some significant NSAID use of taking at 325 mg aspirin instead of an 81 mg aspirin on accident.  She stopped this 10 days ago.  She has been on an iron supplement long-term since she needed IV iron in 2019.  Oral iron has been increased from once to twice a day when hemoglobin noted to be 7.7.  Will repeat today for comparison.  She may need a transfusion prior to endoscopic procedures.  Recommend EGD colonoscopy  to evaluate for GI bleeding.  She has a history of cecum AVMs and no prior EGD.  Sophia Coleman was seen today for follow-up.  Diagnoses and all orders for this visit:  Iron deficiency anemia, unspecified iron deficiency anemia type -     CBC with Differential -     Fe+TIBC+Fer -     COMPLETE METABOLIC PANEL WITH GFR    Patient denies CP, SOB, and use of blood thinners. I discussed the risks and benefits of procedure including bleeding, perforation, infection, missed lesions, medication reactions and possible hospitalization or surgery if complications. All questions answered. Denies prior issues w/ sedation.    EGD/colonoscopy order to be placed when covid restrictions lifted.  40 min total pt care time, >50% face to face plus review of ER visits 06/12/18, 06/15/19 and PCP notes   I personally performed the service, non-incident to. (WP)  Laurine Blazer, [facility]

## 2019-06-23 NOTE — Patient Instructions (Signed)
We are checking labs today for evaluation we will call w/ results

## 2019-06-24 DIAGNOSIS — Z681 Body mass index (BMI) 19 or less, adult: Secondary | ICD-10-CM | POA: Diagnosis not present

## 2019-06-24 DIAGNOSIS — B029 Zoster without complications: Secondary | ICD-10-CM | POA: Diagnosis not present

## 2019-06-24 LAB — CBC WITH DIFFERENTIAL/PLATELET
Absolute Monocytes: 731 cells/uL (ref 200–950)
Basophils Absolute: 58 cells/uL (ref 0–200)
Basophils Relative: 1 %
Eosinophils Absolute: 70 cells/uL (ref 15–500)
Eosinophils Relative: 1.2 %
HCT: 28.9 % — ABNORMAL LOW (ref 35.0–45.0)
Hemoglobin: 9.1 g/dL — ABNORMAL LOW (ref 11.7–15.5)
Lymphs Abs: 319 cells/uL — ABNORMAL LOW (ref 850–3900)
MCH: 26.6 pg — ABNORMAL LOW (ref 27.0–33.0)
MCHC: 31.5 g/dL — ABNORMAL LOW (ref 32.0–36.0)
MCV: 84.5 fL (ref 80.0–100.0)
MPV: 9.8 fL (ref 7.5–12.5)
Monocytes Relative: 12.6 %
Neutro Abs: 4623 cells/uL (ref 1500–7800)
Neutrophils Relative %: 79.7 %
Platelets: 314 10*3/uL (ref 140–400)
RBC: 3.42 10*6/uL — ABNORMAL LOW (ref 3.80–5.10)
RDW: 16.5 % — ABNORMAL HIGH (ref 11.0–15.0)
Total Lymphocyte: 5.5 %
WBC: 5.8 10*3/uL (ref 3.8–10.8)

## 2019-06-24 LAB — COMPLETE METABOLIC PANEL WITH GFR
AG Ratio: 1.4 (calc) (ref 1.0–2.5)
ALT: 17 U/L (ref 6–29)
AST: 20 U/L (ref 10–35)
Albumin: 4.1 g/dL (ref 3.6–5.1)
Alkaline phosphatase (APISO): 73 U/L (ref 37–153)
BUN: 22 mg/dL (ref 7–25)
CO2: 25 mmol/L (ref 20–32)
Calcium: 10.4 mg/dL (ref 8.6–10.4)
Chloride: 105 mmol/L (ref 98–110)
Creat: 0.89 mg/dL (ref 0.60–0.93)
GFR, Est African American: 72 mL/min/{1.73_m2} (ref >=60)
GFR, Est Non African American: 63 mL/min/{1.73_m2} (ref >=60)
Globulin: 2.9 g/dL (calc) (ref 1.9–3.7)
Glucose, Bld: 102 mg/dL (ref 65–139)
Potassium: 4.8 mmol/L (ref 3.5–5.3)
Sodium: 137 mmol/L (ref 135–146)
Total Bilirubin: 0.3 mg/dL (ref 0.2–1.2)
Total Protein: 7 g/dL (ref 6.1–8.1)

## 2019-06-24 LAB — IRON,TIBC AND FERRITIN PANEL
%SAT: 4 % (calc) — ABNORMAL LOW (ref 16–45)
Ferritin: 33 ng/mL (ref 16–288)
Iron: 21 ug/dL — ABNORMAL LOW (ref 45–160)
TIBC: 512 mcg/dL (calc) — ABNORMAL HIGH (ref 250–450)

## 2019-06-27 DIAGNOSIS — Z1211 Encounter for screening for malignant neoplasm of colon: Secondary | ICD-10-CM | POA: Diagnosis not present

## 2019-06-28 DIAGNOSIS — B029 Zoster without complications: Secondary | ICD-10-CM | POA: Diagnosis not present

## 2019-06-28 DIAGNOSIS — Z681 Body mass index (BMI) 19 or less, adult: Secondary | ICD-10-CM | POA: Diagnosis not present

## 2019-07-05 ENCOUNTER — Emergency Department (HOSPITAL_COMMUNITY)
Admission: EM | Admit: 2019-07-05 | Discharge: 2019-07-05 | Disposition: A | Discharge status: Home / Self Care | Payer: Medicare Other | Attending: Emergency Medicine | Admitting: Emergency Medicine

## 2019-07-05 ENCOUNTER — Encounter (HOSPITAL_COMMUNITY): Payer: Self-pay | Admitting: Emergency Medicine

## 2019-07-05 ENCOUNTER — Other Ambulatory Visit: Payer: Self-pay

## 2019-07-05 DIAGNOSIS — F419 Anxiety disorder, unspecified: Secondary | ICD-10-CM | POA: Diagnosis not present

## 2019-07-05 DIAGNOSIS — N189 Chronic kidney disease, unspecified: Secondary | ICD-10-CM | POA: Insufficient documentation

## 2019-07-05 DIAGNOSIS — Z79899 Other long term (current) drug therapy: Secondary | ICD-10-CM | POA: Diagnosis not present

## 2019-07-05 DIAGNOSIS — F17210 Nicotine dependence, cigarettes, uncomplicated: Secondary | ICD-10-CM | POA: Diagnosis not present

## 2019-07-05 DIAGNOSIS — J449 Chronic obstructive pulmonary disease, unspecified: Secondary | ICD-10-CM | POA: Insufficient documentation

## 2019-07-05 DIAGNOSIS — I1310 Hypertensive heart and chronic kidney disease without heart failure, with stage 1 through stage 4 chronic kidney disease, or unspecified chronic kidney disease: Secondary | ICD-10-CM | POA: Diagnosis not present

## 2019-07-05 DIAGNOSIS — I10 Essential (primary) hypertension: Secondary | ICD-10-CM | POA: Diagnosis not present

## 2019-07-05 DIAGNOSIS — Z7982 Long term (current) use of aspirin: Secondary | ICD-10-CM | POA: Insufficient documentation

## 2019-07-05 LAB — URINALYSIS, ROUTINE W REFLEX MICROSCOPIC
Bilirubin Urine: NEGATIVE
Glucose, UA: NEGATIVE mg/dL
Hgb urine dipstick: NEGATIVE
Ketones, ur: 20 mg/dL — AB
Leukocytes,Ua: NEGATIVE
Nitrite: NEGATIVE
Protein, ur: NEGATIVE mg/dL
Specific Gravity, Urine: 1.016 (ref 1.005–1.030)
pH: 7 (ref 5.0–8.0)

## 2019-07-05 LAB — COMPREHENSIVE METABOLIC PANEL
ALT: 24 U/L (ref 0–44)
AST: 27 U/L (ref 15–41)
Albumin: 3.9 g/dL (ref 3.5–5.0)
Alkaline Phosphatase: 79 U/L (ref 38–126)
Anion gap: 10 (ref 5–15)
BUN: 20 mg/dL (ref 8–23)
CO2: 22 mmol/L (ref 22–32)
Calcium: 10 mg/dL (ref 8.9–10.3)
Chloride: 103 mmol/L (ref 98–111)
Creatinine, Ser: 0.92 mg/dL (ref 0.44–1.00)
GFR calc Af Amer: >60 mL/min (ref >60)
GFR calc non Af Amer: >60 mL/min (ref >60)
Glucose, Bld: 97 mg/dL (ref 70–99)
Potassium: 4.8 mmol/L (ref 3.5–5.1)
Sodium: 135 mmol/L (ref 135–145)
Total Bilirubin: 0.6 mg/dL (ref 0.3–1.2)
Total Protein: 7.5 g/dL (ref 6.5–8.1)

## 2019-07-05 LAB — CBC
HCT: 33.8 % — ABNORMAL LOW (ref 36.0–46.0)
Hemoglobin: 10.2 g/dL — ABNORMAL LOW (ref 12.0–15.0)
MCH: 27.1 pg (ref 26.0–34.0)
MCHC: 30.2 g/dL (ref 30.0–36.0)
MCV: 89.9 fL (ref 80.0–100.0)
Platelets: 663 10*3/uL — ABNORMAL HIGH (ref 150–400)
RBC: 3.76 MIL/uL — ABNORMAL LOW (ref 3.87–5.11)
RDW: 20.4 % — ABNORMAL HIGH (ref 11.5–15.5)
WBC: 11.5 10*3/uL — ABNORMAL HIGH (ref 4.0–10.5)
nRBC: 0 % (ref 0.0–0.2)

## 2019-07-05 NOTE — Discharge Instructions (Addendum)
Your labs tonight did not show any signs of infection.  Your hemoglobin level was 10.2 today.  This is an improvement from your previous blood counts.  Be sure to continue taking your iron daily.  Follow-up with your primary doctor for recheck.

## 2019-07-05 NOTE — ED Provider Notes (Signed)
[facility] EMERGENCY DEPARTMENT Provider Note   CSN: [id number] Arrival date & time: 07/05/19  1803     History Chief Complaint  Patient presents with  . Anxiety    Sophia Coleman is a 79 y.o. female.  HPI      Sophia Coleman is a 79 y.o. female with past medical history of dementia, hypertension, chronic kidney disease, and anemia presents to the Emergency Department with her son.  Son states that she has a history of dementia and recurrent anxiety.  He states that at times she gets very confused and paranoid.  He states this has been happening for some time, but today she had a sudden onset of anxiety after returning home from the store.  He states her symptoms typically spontaneously resolve, but he is concerned as to why they continue to occur.  He states that she has recently been treated for shingles of her left chest and he was concerned that she may be developing an infection or that her anemia may be worsening.  Patient denies any symptoms at present.  States that she has a neurology appointment with [facility] neurology sometime early March    Past Medical History:  Diagnosis Date  . Anemia   . Anxiety   . Arthritis   . Carotid artery occlusion   . Chronic kidney disease   . Colon polyps   . COPD (chronic obstructive pulmonary disease) ([place])   . Depression   . Dyspnea   . Heart murmur   . Hypertension   . Memory difficulty   . Pneumonia   . PONV (postoperative nausea and vomiting)     Patient Active Problem List   Diagnosis Date Noted  . Iron deficiency anemia due to chronic blood loss 04/22/2017  . History of colonic polyps 09/18/2016  . Carotid stenosis 04/02/2016    Past Surgical History:  Procedure Laterality Date  . CAROTID ENDARTERECTOMY    . CATARACT EXTRACTION W/PHACO Left 01/02/2014   Procedure: CATARACT EXTRACTION PHACO AND INTRAOCULAR LENS PLACEMENT (IOC);  Surgeon: Tonny Branch, MD;  Location: [facility];  Service: Ophthalmology;  Laterality: Left;   CDE 8.17  . CATARACT EXTRACTION W/PHACO Right 03/06/2014   Procedure: CATARACT EXTRACTION PHACO AND INTRAOCULAR LENS PLACEMENT ([place]);  Surgeon: Tonny Branch, MD;  Location: [facility] OR[facility];  Service: Ophthalmology;  Laterality: Right;  CDE:15.14  . CHOLECYSTECTOMY    . COLONOSCOPY  11/13/2011   Procedure: COLONOSCOPY;  Surgeon: Rogene Houston, MD;  Location: [facility];  Service: Endoscopy;  Laterality: N/A;  1030  . COLONOSCOPY N/A 01/22/2017   Procedure: COLONOSCOPY;  Surgeon: Rogene Houston, MD;  [facility];  Service: Endoscopy;  Laterality: N/A;  1030  . ENDARTERECTOMY Left 04/02/2016   Procedure: ENDARTERECTOMY LEFT CAROTID ARTERY;  Surgeon: Rosetta Posner, MD;  Location: [facility];  Service: Vascular;  Laterality: Left;  . PATCH ANGIOPLASTY Left 04/02/2016   Procedure: PATCH ANGIOPLASTY LEFT CAROTID ARTERY USING HEMASHIELD PLATINUM FINESS PATCH;  Surgeon: Rosetta Posner, MD;  Location: [facility] OR;  Service: Vascular;  Laterality: Left;     OB History   No obstetric history on file.     Family History  Problem Relation Age of Onset  . Heart murmur Mother   . Heart Problems Mother   . Heart disease Mother   . Hypertension Mother   . Glaucoma Mother   . Emphysema Father        COPD  . Cancer Sister   .  Lung cancer Sister   . Dementia Brother   . Stroke Maternal Grandmother   . Heart attack Maternal Grandfather   . Kidney disease Paternal Grandfather   . Colon cancer Neg Hx     Social History   Tobacco Use  . Smoking status: Current Every Day Smoker    Packs/day: 1.00    Years: 50.00    Pack years: 50.00    Types: Cigarettes  . Smokeless tobacco: Never Used  Substance Use Topics  . Alcohol use: No  . Drug use: No    Home Medications Prior to Admission medications   Medication Sig Start Date End Date Taking? Authorizing Provider  aspirin EC 81 MG tablet Take 81 mg by mouth daily.    [provider]  azithromycin (ZITHROMAX) 250 MG tablet Take 2 tablets on day1,  then 1 tablet for day2-day5. 06/01/19   [provider]  donepezil (ARICEPT) 5 MG tablet Take 5 mg by mouth at bedtime. 03/10/19   [provider]  ferrous sulfate 325 (65 FE) MG tablet Take 1 tablet (325 mg total) by mouth daily. 06/13/19   Sherwood Gambler, MD  losartan (COZAAR) 100 MG tablet Take 100 mg by mouth every morning.     [provider]  PARoxetine (PAXIL) 40 MG tablet Take 40 mg by mouth every morning.    [provider]  predniSONE (STERAPRED UNI-PAK 21 TAB) 5 MG (21) TBPK tablet TAKE AS DIRECTED PERLPACKAGE INSTRUCTIONS. 06/01/19   [provider]    Allergies    Vicodin [hydrocodone-acetaminophen], Augmentin [amoxicillin-pot clavulanate], and Quinolones  Review of Systems   Review of Systems  Constitutional: Negative for chills, fatigue and fever.  Respiratory: Negative for cough and shortness of breath.   Cardiovascular: Negative for chest pain and palpitations.  Gastrointestinal: Negative for abdominal pain, nausea and vomiting.  Genitourinary: Negative for dysuria and frequency.  Musculoskeletal: Negative for arthralgias, neck pain and neck stiffness.  Skin: Positive for rash.  Neurological: Negative for dizziness, syncope, weakness and numbness.  Hematological: Does not bruise/bleed easily.  Psychiatric/Behavioral: Negative for decreased concentration, self-injury and suicidal ideas. The patient is nervous/anxious.     Physical Exam Updated Vital Signs BP 112/66   Pulse 73   Temp 98.2 F (36.8 C) (Oral)   Resp 15   Ht 5\' 5"  (1.651 m)   Wt 41 kg   SpO2 100%   BMI 15.04 kg/m   Physical Exam Vitals and nursing note reviewed.  Constitutional:      Appearance: Normal appearance. She is not ill-appearing or toxic-appearing.  HENT:     Head: Atraumatic.  Eyes:     Extraocular Movements: Extraocular movements intact.     Conjunctiva/sclera: Conjunctivae normal.     Pupils: Pupils are equal, round, and reactive to  light.  Cardiovascular:     Rate and Rhythm: Normal rate and regular rhythm.     Pulses: Normal pulses.  Pulmonary:     Effort: Pulmonary effort is normal.     Breath sounds: Normal breath sounds.  Abdominal:     General: There is no distension.     Palpations: Abdomen is soft.     Tenderness: There is no abdominal tenderness.  Musculoskeletal:     Cervical back: Normal range of motion. No rigidity or tenderness.  Skin:    General: Skin is warm.     Comments: Maculopapular lesions with crusting of the left lower breast and chest wall that extend to the midline thoracic spine.  Lesions appear to be healing.  Neurological:     General: No focal deficit present.     Mental Status: She is alert and oriented to person, place, and time.     Sensory: No sensory deficit.     Motor: Motor function is intact. No weakness.     Coordination: Coordination is intact.     Comments: CN II-XII intact.  Speech clear, no pronator drift or facial weakness.    Psychiatric:        Mood and Affect: Mood normal.        Behavior: Behavior normal.        Thought Content: Thought content normal.     ED Results / Procedures / Treatments   Labs (all labs ordered are listed, but only abnormal results are displayed) Labs Reviewed  CBC - Abnormal; Notable for the following components:      Result Value   WBC 11.5 (*)    RBC 3.76 (*)    Hemoglobin 10.2 (*)    HCT 33.8 (*)    RDW 20.4 (*)    Platelets 663 (*)    All other components within normal limits  URINALYSIS, ROUTINE W REFLEX MICROSCOPIC - Abnormal; Notable for the following components:   APPearance CLOUDY (*)    Ketones, ur 20 (*)    All other components within normal limits  COMPREHENSIVE METABOLIC PANEL    EKG EKG Interpretation  Date/Time:  Tuesday July 05 2019 18:13:04 EST Ventricular Rate:  72 PR Interval:  120 QRS Duration: 78 QT Interval:  402 QTC Calculation: 440 R Axis:   67 Text Interpretation: Normal sinus rhythm  Anterior infarct , age undetermined Abnormal ECG Confirmed by Nat Christen [phone]) on 07/05/2019 8:52:30 PM   Radiology No results found.  Procedures Procedures (including critical care time)  Medications Ordered in ED Medications - No data to display  ED Course  I have reviewed the triage vital signs and the nursing notes.  Pertinent labs & imaging results that were available during my care of the patient were reviewed by me and considered in my medical decision making (see chart for details).    MDM Rules/Calculators/A&P                      Patient here for evaluation of anxiety.  This is been a reoccurring problem for her.  Family member concerned that her chronic anemia may be worse or that she has an infection.  Patient denies symptoms at present.  She has recently been treated for shingles which appear to be healing without indication for secondary infection.  She is calm, cooperative and alert.  Well-appearing.  No focal neuro deficits on exam.  Work-up this evening is also reassuring.  Family reassured.  She does have upcoming follow-up appointment with her neurologist.  Patient also seen by Dr. Lacinda Axon and care plan discussed.     Final Clinical Impression(s) / ED Diagnoses Final diagnoses:  Anxiety    Rx / DC Orders ED Discharge Orders    None       Bufford Lope 07/05/19 2352    Nat Christen, MD 07/06/19 1521

## 2019-07-05 NOTE — ED Triage Notes (Addendum)
Pt family reports pt has history of dementia and reports anxiety, paranoia, confusion today. Pt family reports history of same and reports usually gets back to baseline but reports wants to figure out why this keep happening.  Pt hyperventilating/paranoid in triage. Pt consoled and redirected by family.  Pt is currently being treated for shingles.

## 2019-07-06 ENCOUNTER — Telehealth (INDEPENDENT_AMBULATORY_CARE_PROVIDER_SITE_OTHER): Payer: Self-pay | Admitting: *Deleted

## 2019-07-06 NOTE — Telephone Encounter (Signed)
Called patient to schedule TCS/EGD - she stated she didn't remember that being discussed at [place] but she isn't ready to do that now - she will call me when she is ready to schedule

## 2019-07-07 NOTE — Telephone Encounter (Signed)
Left message for Sophia Coleman to give me a call to schedule procedures

## 2019-07-07 NOTE — Telephone Encounter (Signed)
Patient has evidence of memory impairment. We discussed this in detail at her visit, her daughter-in-law Lupita Dawn was at the visit but per the chart her daughter handles all with her appointments.  Number in chart.  Can you reach out to her daughter to see if she would like to schedule thanks.

## 2019-07-11 ENCOUNTER — Encounter (HOSPITAL_COMMUNITY): Payer: Medicare Other

## 2019-07-25 ENCOUNTER — Other Ambulatory Visit: Payer: Self-pay

## 2019-07-25 ENCOUNTER — Encounter: Payer: Self-pay | Admitting: Family Medicine

## 2019-07-25 ENCOUNTER — Ambulatory Visit: Payer: Medicare Other | Admitting: Family Medicine

## 2019-07-25 VITALS — BP 156/70 | HR 77 | Temp 97.8°F | Ht 60.0 in | Wt 91.0 lb

## 2019-07-25 DIAGNOSIS — F329 Major depressive disorder, single episode, unspecified: Secondary | ICD-10-CM

## 2019-07-25 DIAGNOSIS — R413 Other amnesia: Secondary | ICD-10-CM | POA: Diagnosis not present

## 2019-07-25 DIAGNOSIS — F419 Anxiety disorder, unspecified: Secondary | ICD-10-CM

## 2019-07-25 DIAGNOSIS — F32A Depression, unspecified: Secondary | ICD-10-CM

## 2019-07-25 MED ORDER — BUSPIRONE HCL 5 MG PO TABS: 5.0000 mg | ORAL_TABLET | Freq: Every day | ORAL | 11 refills | Status: DC

## 2019-07-25 NOTE — Progress Notes (Signed)
PATIENT: Sophia Coleman DOB: 11/02/1940  REASON FOR VISIT: follow up HISTORY FROM: patient  Chief Complaint  Patient presents with  . Follow-up    RM2. with daughter. Short term memory has worsened.     HISTORY OF PRESENT ILLNESS: Today 07/25/19 Sophia Coleman is a 79 y.o. female here today for follow up for memory loss. She tried Namenda but did not like how she felt on it and stopped. She feels that it caused nightmares. She also had nightmares on Aricept. Her daughter is with her today and aids in history. She is doing fairly well. She continues to live alone. She is able to perform all ADL's independently. She is eating well. She continues to note anxiety and depression. She continues Paxil daily prescribed by PCP. Her daughter reports that she is having more panic symptoms recently. She gets worked up very quickly. She has family that live across the street and can come when she needs help. They have had to call EMS recently due to panic attacks. She has taken Xanax in the past for anxiety that does help but patient and daughter are hesitant to continue medication.  HISTORY: (copied from my note on 01/18/2019)  Sophia Coleman is a 79 y.o. female here today for follow up for concerns of memory loss. She has tried Aricept in the past with concerns unwanted dreams. She was advised to consider Namenda at last visit.  She feels that this may be a reasonable medication to consider.  Admits to suffering from depression.  She has been on Paxil 40 mg for many years.  She has attempted to switch to a different SSRI in the past but feels that she was unable to tolerate increased anxiety.  She lives at home alone.  She is able to perform all ADLs.  She is able to dose her own medications.  She does have family members that check on her regularly and live close by.  She continues to drive locally and short distances.  There have been no concerns of accidents or getting lost.  No falls at  home.   HISTORY: (copied from Dr Gladstone Lighter note on 09/16/2018)  79 year old female here for evaluation of memory loss and confusion.  Patient has had 6 to 12 months of mild short-term memory loss, forgetting recent conversations and events, names of grandchildren, without change in ADLs.  In April 2020 she had a more severe change in symptoms. She had anxiety, shortness of breath, lightheadedness and confusion. Patient went to the hospital on 09/01/2018 for evaluation. Lab testing were unremarkable. She was discharged home.  09/03/2018 patient continued to have symptoms and was very confused and amnestic to her first emergency room visit. Therefore she went back to the emergency room and had CT scan of the head which was unremarkable.  Since that time patient is doing little bit better.  Patient typically lives alone. Currently her family is with her. She normally is able to take care of all of her ADLs.  Her PCP started her on Aricept 5 mg, but she took this dose and had severe crazy dreams, and she stopped the medication.  Patient has history of emphysema, hypertension, heart disease, carotid artery disease status post endarterectomy, ongoing tobacco abuse.   REVIEW OF SYSTEMS: Out of a complete 14 system review of symptoms, the patient complains only of the following symptoms, memory loss, anxiety, depression and all other reviewed systems are negative.  ALLERGIES: Allergies  Allergen Reactions  .  Vicodin [Hydrocodone-Acetaminophen] Nausea And Vomiting  . Augmentin [Amoxicillin-Pot Clavulanate] Other (See Comments)    "SEVERE SWEATS" Has patient had a PCN reaction causing immediate rash, facial/tongue/throat swelling, SOB or lightheadedness with hypotension: No Has patient had a PCN reaction causing severe rash involving mucus membranes or skin necrosis: No Has patient had a PCN reaction that required hospitalization: No Has patient had a PCN reaction occurring  within the last 10 years: Yes If all of the above answers are "NO", then may proceed with Cephalosporin use.   . Quinolones Nausea And Vomiting    HOME MEDICATIONS: Outpatient Medications Prior to Visit  Medication Sig Dispense Refill  . ferrous sulfate 325 (65 FE) MG tablet Take 1 tablet (325 mg total) by mouth daily. 30 tablet 0  . losartan (COZAAR) 100 MG tablet Take 100 mg by mouth every morning.     Marland Kitchen PARoxetine (PAXIL) 40 MG tablet Take 40 mg by mouth every morning.    Marland Kitchen aspirin EC 81 MG tablet Take 81 mg by mouth daily.    Marland Kitchen azithromycin (ZITHROMAX) 250 MG tablet Take 2 tablets on day1, then 1 tablet for day2-day5.    . donepezil (ARICEPT) 5 MG tablet Take 5 mg by mouth at bedtime.    . predniSONE (STERAPRED UNI-PAK 21 TAB) 5 MG (21) TBPK tablet TAKE AS DIRECTED PERLPACKAGE INSTRUCTIONS.     No facility-administered medications prior to visit.    PAST MEDICAL HISTORY: Past Medical History:  Diagnosis Date  . Anemia   . Anxiety   . Arthritis   . Carotid artery occlusion   . Chronic kidney disease   . Colon polyps   . COPD (chronic obstructive pulmonary disease) ([place])   . Depression   . Dyspnea   . Heart murmur   . Hypertension   . Memory difficulty   . Pneumonia   . PONV (postoperative nausea and vomiting)     PAST SURGICAL HISTORY: Past Surgical History:  Procedure Laterality Date  . CAROTID ENDARTERECTOMY    . CATARACT EXTRACTION W/PHACO Left 01/02/2014   Procedure: CATARACT EXTRACTION PHACO AND INTRAOCULAR LENS PLACEMENT (IOC);  Surgeon: Tonny Branch, MD;  Location: [facility];  Service: Ophthalmology;  Laterality: Left;  CDE 8.17  . CATARACT EXTRACTION W/PHACO Right 03/06/2014   Procedure: CATARACT EXTRACTION PHACO AND INTRAOCULAR LENS PLACEMENT ([place]);  Surgeon: Tonny Branch, MD;  Location: [facility];  Service: Ophthalmology;  Laterality: Right;  CDE:15.14  . CHOLECYSTECTOMY    . COLONOSCOPY  11/13/2011   Procedure: COLONOSCOPY;  Surgeon: Rogene Houston, MD;  [facility];  Service: Endoscopy;  Laterality: N/A;  1030  . COLONOSCOPY N/A 01/22/2017   Procedure: COLONOSCOPY;  Surgeon: Rogene Houston, MD;  [facility];  Service: Endoscopy;  Laterality: N/A;  1030  . ENDARTERECTOMY Left 04/02/2016   Procedure: ENDARTERECTOMY LEFT CAROTID ARTERY;  Surgeon: Rosetta Posner, MD;  Location: [facility];  Service: Vascular;  Laterality: Left;  . PATCH ANGIOPLASTY Left 04/02/2016   Procedure: PATCH ANGIOPLASTY LEFT CAROTID ARTERY USING HEMASHIELD PLATINUM FINESS PATCH;  Surgeon: Rosetta Posner, MD;  Location: [facility] OR;  Service: Vascular;  Laterality: Left;    FAMILY HISTORY: Family History  Problem Relation Age of Onset  . Heart murmur Mother   . Heart Problems Mother   . Heart disease Mother   . Hypertension Mother   . Glaucoma Mother   . Emphysema Father        COPD  . Cancer Sister   .  Lung cancer Sister   . Dementia Brother   . Stroke Maternal Grandmother   . Heart attack Maternal Grandfather   . Kidney disease Paternal Grandfather   . Colon cancer Neg Hx     SOCIAL HISTORY: Social History   Socioeconomic History  . Marital status: Widowed    Spouse name: Not on file  . Number of children: 2  . Years of education: Not on file  . Highest education level: Not on file  Occupational History    Comment: retired  Tobacco Use  . Smoking status: Current Every Day Smoker    Packs/day: 1.00    Years: 50.00    Pack years: 50.00    Types: Cigarettes  . Smokeless tobacco: Never Used  Substance and Sexual Activity  . Alcohol use: No  . Drug use: No  . Sexual activity: Not on file  Other Topics Concern  . Not on file  Social History Narrative   Lives alone   Social Determinants of Health   Financial Resource Strain:   . Difficulty of Paying Living Expenses: Not on file  Food Insecurity:   . Worried About Charity fundraiser in the Last Year: Not on file  . Ran Out of Food in the Last Year: Not on file  Transportation Needs:   .  Lack of Transportation (Medical): Not on file  . Lack of Transportation (Non-Medical): Not on file  Physical Activity:   . Days of Exercise per Week: Not on file  . Minutes of Exercise per Session: Not on file  Stress:   . Feeling of Stress : Not on file  Social Connections:   . Frequency of Communication with Friends and Family: Not on file  . Frequency of Social Gatherings with Friends and Family: Not on file  . Attends Religious Services: Not on file  . Active Member of Clubs or Organizations: Not on file  . Attends Archivist Meetings: Not on file  . Marital Status: Not on file  Intimate Partner Violence:   . Fear of Current or Ex-Partner: Not on file  . Emotionally Abused: Not on file  . Physically Abused: Not on file  . Sexually Abused: Not on file      PHYSICAL EXAM  Vitals:   07/25/19 1524  BP: (!) 156/70  Pulse: 77  Temp: 97.8 F (36.6 C)  SpO2: 98%  Weight: 91 lb (41.3 kg)  Height: 5' (1.524 m)   Body mass index is 17.77 kg/m.  Generalized: Well developed, in no acute distress  Cardiology: normal rate and rhythm, no murmur noted Respiratory: clear to auscultation bilaterally  Neurological examination  Mentation: Alert oriented to time, place, history taking. Follows all commands speech and language fluent Cranial nerve II-XII: Pupils were equal round reactive to light. Extraocular movements were full, visual field were full  Motor: The motor testing reveals 5 over 5 strength of all 4 extremities. Good symmetric motor tone is noted throughout.   Gait and station: Gait is normal.    DIAGNOSTIC DATA (LABS, IMAGING, TESTING) - I reviewed patient records, labs, notes, testing and imaging myself where available.  MMSE - Mini Mental State Exam 07/25/2019 01/18/2019 09/16/2018  Not completed: - (No Data) -  Orientation to time 3 4 4   Orientation to Place 4 2 5   Registration 3 3 3   Attention/ Calculation 0 0 2  Recall 0 0 0  Language- name 2 objects 2 2  2   Language- repeat 1 1 1  Language- follow 3 step command 3 3 3   Language- read & follow direction 1 1 1   Write a sentence 1 1 1   Copy design 1 0 0  Total score 19 17 22      Lab Results  Component Value Date   WBC 11.5 (H) 07/05/2019   HGB 10.2 (L) 07/05/2019   HCT 33.8 (L) 07/05/2019   MCV 89.9 07/05/2019   PLT 663 (H) 07/05/2019      Component Value Date/Time   NA 135 07/05/2019 1919   K 4.8 07/05/2019 1919   CL 103 07/05/2019 1919   CO2 22 07/05/2019 1919   GLUCOSE 97 07/05/2019 1919   BUN 20 07/05/2019 1919   CREATININE 0.92 07/05/2019 1919   CREATININE 0.89 06/23/2019 1308   CALCIUM 10.0 07/05/2019 1919   PROT 7.5 07/05/2019 1919   ALBUMIN 3.9 07/05/2019 1919   AST 27 07/05/2019 1919   ALT 24 07/05/2019 1919   ALKPHOS 79 07/05/2019 1919   BILITOT 0.6 07/05/2019 1919   GFRNONAA >60 07/05/2019 1919   GFRNONAA 63 06/23/2019 1308   GFRAA >60 07/05/2019 1919   GFRAA 72 06/23/2019 1308   No results found for: CHOL, HDL, LDLCALC, LDLDIRECT, TRIG, CHOLHDL No results found for: HGBA1C Lab Results  Component Value Date   VITAMINB12 357 06/16/2017   No results found for: TSH    ASSESSMENT AND PLAN 79 y.o. year old female  has a past medical history of Anemia, Anxiety, Arthritis, Carotid artery occlusion, Chronic kidney disease, Colon polyps, COPD (chronic obstructive pulmonary disease) ([place]), Depression, Dyspnea, Heart murmur, Hypertension, Memory difficulty, Pneumonia, and PONV (postoperative nausea and vomiting). here with     ICD-10-CM   1. Memory loss  R41.3   2. Anxiety and depression  F41.9    F32.9     Sophia Coleman is doing fairly well, overall. Memory is stable. She continues to have good and bad days. She does note increasing anxiety. Her daughter is concerned of more panic symptoms. We have discussed adding low dose buspirone. Daughter is pharmacy tech and comfortable with this option. We will start 5mg  daily. May increase dose pending response. She and her  daughter were educated on possible side effects and given additional information in AVS. Daughter will call with any concerns. I have advised that she not drive alone. Local daytime driving advised. She will stay active and follow up with PCP as directed. We will follow up in 6 months, sooner if needed. She and her daughter verbalize understanding and agreement with this plan.    No orders of the defined types were placed in this encounter.    Meds ordered this encounter  Medications  . busPIRone (BUSPAR) 5 MG tablet    Sig: Take 1 tablet (5 mg total) by mouth at bedtime.    Dispense:  30 tablet    Refill:  11    Order Specific Question:   Supervising Provider    Answer:   Melvenia Beam [id number]      I spent 20 minutes with the patient. 50% of this time was spent counseling and educating patient on plan of care and medications.    Debbora Presto, FNP-C 07/25/2019, 4:32 PM [facility] [address] [place], [place] [address] [phone]

## 2019-07-25 NOTE — Patient Instructions (Addendum)
We will add buspirone (Buspar) for help with anxiety, start 5mg  daily. We may increase dose if needed and well tolerated.   I advise caution with driving. I recommend someone be with you. Daytime, local driving only.   Continue close follow up with PCP   Stay well hydrated and eat regular meals   Follow up in 6 months     Memory Compensation Strategies  1. Use "WARM" strategy.  W= write it down  A= associate it  R= repeat it  M= make a mental note  2.   You can keep a Social worker.  Use a 3-ring notebook with sections for the following: calendar, important names and phone numbers,  medications, doctors' names/phone numbers, lists/reminders, and a section to journal what you did  each day.   3.    Use a calendar to write appointments down.  4.    Write yourself a schedule for the day.  This can be placed on the calendar or in a separate section of the Memory Notebook.  Keeping a  regular schedule can help memory.  5.    Use medication organizer with sections for each day or morning/evening pills.  You may need help loading it  6.    Keep a basket, or pegboard by the door.  Place items that you need to take out with you in the basket or on the pegboard.  You may also want to  include a message board for reminders.  7.    Use sticky notes.  Place sticky notes with reminders in a place where the task is performed.  For example: " turn off the  stove" placed by the stove, "lock the door" placed on the door at eye level, " take your medications" on  the bathroom mirror or by the place where you normally take your medications.  8.    Use alarms/timers.  Use while cooking to remind yourself to check on food or as a reminder to take your medicine, or as a  reminder to make a call, or as a reminder to perform another task, etc.   Buspirone tablets What is this medicine? BUSPIRONE (byoo SPYE rone) is used to treat anxiety disorders. This medicine may be used for other  purposes; ask your health care provider or pharmacist if you have questions. COMMON BRAND NAME(S): BuSpar What should I tell my health care provider before I take this medicine? They need to know if you have any of these conditions:  kidney or liver disease  an unusual or allergic reaction to buspirone, other medicines, foods, dyes, or preservatives  pregnant or trying to get pregnant  breast-feeding How should I use this medicine? Take this medicine by mouth with a glass of water. Follow the directions on the prescription label. You may take this medicine with or without food. To ensure that this medicine always works the same way for you, you should take it either always with or always without food. Take your doses at regular intervals. Do not take your medicine more often than directed. Do not stop taking except on the advice of your doctor or health care professional. Talk to your pediatrician regarding the use of this medicine in children. Special care may be needed. Overdosage: If you think you have taken too much of this medicine contact a poison control center or emergency room at once. NOTE: This medicine is only for you. Do not share this medicine with others. What if I miss a  dose? If you miss a dose, take it as soon as you can. If it is almost time for your next dose, take only that dose. Do not take double or extra doses. What may interact with this medicine? Do not take this medicine with any of the following medications:  linezolid  MAOIs like Carbex, Eldepryl, Marplan, Nardil, and Parnate  methylene blue  procarbazine This medicine may also interact with the following medications:  diazepam  digoxin  diltiazem  erythromycin  grapefruit juice  haloperidol  medicines for mental depression or mood problems  medicines for seizures like carbamazepine, phenobarbital and phenytoin  nefazodone  other medications for anxiety  rifampin  ritonavir  some  antifungal medicines like itraconazole, ketoconazole, and voriconazole  verapamil  warfarin This list may not describe all possible interactions. Give your health care provider a list of all the medicines, herbs, non-prescription drugs, or dietary supplements you use. Also tell them if you smoke, drink alcohol, or use illegal drugs. Some items may interact with your medicine. What should I watch for while using this medicine? Visit your doctor or health care professional for regular checks on your progress. It may take 1 to 2 weeks before your anxiety gets better. You may get drowsy or dizzy. Do not drive, use machinery, or do anything that needs mental alertness until you know how this drug affects you. Do not stand or sit up quickly, especially if you are an older patient. This reduces the risk of dizzy or fainting spells. Alcohol can make you more drowsy and dizzy. Avoid alcoholic drinks. What side effects may I notice from receiving this medicine? Side effects that you should report to your doctor or health care professional as soon as possible:  blurred vision or other vision changes  chest pain  confusion  difficulty breathing  feelings of hostility or anger  muscle aches and pains  numbness or tingling in hands or feet  ringing in the ears  skin rash and itching  vomiting  weakness Side effects that usually do not require medical attention (report to your doctor or health care professional if they continue or are bothersome):  disturbed dreams, nightmares  headache  nausea  restlessness or nervousness  sore throat and nasal congestion  stomach upset This list may not describe all possible side effects. Call your doctor for medical advice about side effects. You may report side effects to FDA at 1-800-FDA-1088. Where should I keep my medicine? Keep out of the reach of children. Store at room temperature below 30 degrees C (86 degrees F). Protect from light. Keep  container tightly closed. Throw away any unused medicine after the expiration date. NOTE: This sheet is a summary. It may not cover all possible information. If you have questions about this medicine, talk to your doctor, pharmacist, or health care provider.  2020 Elsevier/Gold Standard (2009-12-20 18:06:11)

## 2019-08-17 NOTE — Progress Notes (Signed)
I reviewed note and agree with plan.   Penni Bombard, MD [id number], [id number] AM Certified in Neurology, Neurophysiology and Neuroimaging  [facility] [address] [place], [place] [address] [phone]

## 2019-12-19 ENCOUNTER — Other Ambulatory Visit (HOSPITAL_COMMUNITY): Payer: Self-pay | Admitting: Family Medicine

## 2019-12-19 DIAGNOSIS — Z1231 Encounter for screening mammogram for malignant neoplasm of breast: Secondary | ICD-10-CM

## 2019-12-21 ENCOUNTER — Other Ambulatory Visit: Payer: Self-pay

## 2019-12-21 ENCOUNTER — Ambulatory Visit (HOSPITAL_COMMUNITY)
Admission: RE | Admit: 2019-12-21 | Discharge: 2019-12-21 | Disposition: A | Discharge status: Home / Self Care | Payer: Medicare Other | Source: Ambulatory Visit | Attending: Family Medicine | Admitting: Family Medicine

## 2019-12-21 DIAGNOSIS — Z1231 Encounter for screening mammogram for malignant neoplasm of breast: Secondary | ICD-10-CM | POA: Diagnosis not present

## 2020-01-13 ENCOUNTER — Other Ambulatory Visit (HOSPITAL_COMMUNITY): Payer: Self-pay | Admitting: Family Medicine

## 2020-01-13 DIAGNOSIS — Z681 Body mass index (BMI) 19 or less, adult: Secondary | ICD-10-CM | POA: Diagnosis not present

## 2020-01-13 DIAGNOSIS — J449 Chronic obstructive pulmonary disease, unspecified: Secondary | ICD-10-CM | POA: Diagnosis not present

## 2020-01-13 DIAGNOSIS — M545 Low back pain, unspecified: Secondary | ICD-10-CM

## 2020-01-13 DIAGNOSIS — N182 Chronic kidney disease, stage 2 (mild): Secondary | ICD-10-CM | POA: Diagnosis not present

## 2020-01-13 DIAGNOSIS — I10 Essential (primary) hypertension: Secondary | ICD-10-CM | POA: Diagnosis not present

## 2020-01-14 ENCOUNTER — Other Ambulatory Visit (HOSPITAL_COMMUNITY): Payer: Self-pay | Admitting: Family Medicine

## 2020-01-14 ENCOUNTER — Other Ambulatory Visit: Payer: Self-pay

## 2020-01-14 ENCOUNTER — Ambulatory Visit (HOSPITAL_COMMUNITY)
Admission: RE | Admit: 2020-01-14 | Discharge: 2020-01-14 | Disposition: A | Discharge status: Home / Self Care | Payer: Medicare Other | Source: Ambulatory Visit | Attending: Family Medicine | Admitting: Family Medicine

## 2020-01-14 DIAGNOSIS — M5440 Lumbago with sciatica, unspecified side: Secondary | ICD-10-CM | POA: Diagnosis not present

## 2020-01-14 DIAGNOSIS — M4726 Other spondylosis with radiculopathy, lumbar region: Secondary | ICD-10-CM | POA: Diagnosis not present

## 2020-01-14 DIAGNOSIS — M545 Low back pain, unspecified: Secondary | ICD-10-CM

## 2020-01-14 DIAGNOSIS — M4186 Other forms of scoliosis, lumbar region: Secondary | ICD-10-CM | POA: Diagnosis not present

## 2020-01-25 ENCOUNTER — Ambulatory Visit: Payer: Medicare Other | Admitting: Family Medicine

## 2020-01-25 ENCOUNTER — Encounter: Payer: Self-pay | Admitting: Family Medicine

## 2020-01-25 ENCOUNTER — Other Ambulatory Visit: Payer: Self-pay

## 2020-01-25 VITALS — BP 118/61 | HR 73 | Ht 59.0 in | Wt 88.0 lb

## 2020-01-25 DIAGNOSIS — R413 Other amnesia: Secondary | ICD-10-CM | POA: Diagnosis not present

## 2020-01-25 DIAGNOSIS — F329 Major depressive disorder, single episode, unspecified: Secondary | ICD-10-CM

## 2020-01-25 DIAGNOSIS — F32A Depression, unspecified: Secondary | ICD-10-CM

## 2020-01-25 DIAGNOSIS — F419 Anxiety disorder, unspecified: Secondary | ICD-10-CM | POA: Diagnosis not present

## 2020-01-25 MED ORDER — BUSPIRONE HCL 5 MG PO TABS: 5.0000 mg | ORAL_TABLET | Freq: Two times a day (BID) | ORAL | 11 refills | Status: DC

## 2020-01-25 NOTE — Patient Instructions (Addendum)
Let's try taking Buspar 5mg  twice daily. Please monitor for side effects as discussed.   Continue to stay well hydrated. Focus on healthy diet and regular activity. Stay mentally and physically active.   Follow up with me in 6 months    Memory Compensation Strategies  1. Use "WARM" strategy.  W= write it down  A= associate it  R= repeat it  M= make a mental note  2.   You can keep a Social worker.  Use a 3-ring notebook with sections for the following: calendar, important names and phone numbers,  medications, doctors' names/phone numbers, lists/reminders, and a section to journal what you did  each day.   3.    Use a calendar to write appointments down.  4.    Write yourself a schedule for the day.  This can be placed on the calendar or in a separate section of the Memory Notebook.  Keeping a  regular schedule can help memory.  5.    Use medication organizer with sections for each day or morning/evening pills.  You may need help loading it  6.    Keep a basket, or pegboard by the door.  Place items that you need to take out with you in the basket or on the pegboard.  You may also want to  include a message board for reminders.  7.    Use sticky notes.  Place sticky notes with reminders in a place where the task is performed.  For example: " turn off the  stove" placed by the stove, "lock the door" placed on the door at eye level, " take your medications" on  the bathroom mirror or by the place where you normally take your medications.  8.    Use alarms/timers.  Use while cooking to remind yourself to check on food or as a reminder to take your medicine, or as a  reminder to make a call, or as a reminder to perform another task, etc.   Dementia Caregiver Guide Dementia is a term used to describe a number of symptoms that affect memory and thinking. The most common symptoms include:  Memory loss.  Trouble with language and communication.  Trouble concentrating.  Poor  judgment.  Problems with reasoning.  Child-like behavior and language.  Extreme anxiety.  Angry outbursts.  Wandering from home or public places. Dementia usually gets worse slowly over time. In the early stages, people with dementia can stay independent and safe with some help. In later stages, they need help with daily tasks such as dressing, grooming, and using the bathroom. How to help the person with dementia cope Dementia can be frightening and confusing. Here are some tips to help the person with dementia cope with changes caused by the disease. General tips  Keep the person on track with his or her routine.  Try to identify areas where the person may need help.  Be supportive, patient, calm, and encouraging.  Gently remind the person that adjusting to changes takes time.  Help with the tasks that the person has asked for help with.  Keep the person involved in daily tasks and decisions as much as possible.  Encourage conversation, but try not to get frustrated or harried if the person struggles to find words or does not seem to appreciate your help. Communication tips  When the person is talking or seems frustrated, make eye contact and hold the person's hand.  Ask specific questions that need yes or no answers.  Use simple words, short sentences, and a calm voice. Only give one direction at a time.  When offering choices, limit them to just 1 or 2.  Avoid correcting the person in a negative way.  If the person is struggling to find the right words, gently try to help him or her. How to recognize symptoms of stress Symptoms of stress in caregivers include:  Feeling frustrated or angry with the person with dementia.  Denying that the person has dementia or that his or her symptoms will not improve.  Feeling hopeless and unappreciated.  Difficulty sleeping.  Difficulty concentrating.  Feeling anxious, irritable, or depressed.  Developing stress-related  health problems.  Feeling like you have too little time for your own life. Follow these instructions at home:   Make sure that you and the person you are caring for: ? Get regular sleep. ? Exercise regularly. ? Eat regular, nutritious meals. ? Drink enough fluid to keep your urine clear or pale yellow. ? Take over-the-counter and prescription medicines only as told by your health care providers. ? Attend all scheduled health care appointments.  Join a support group with others who are caregivers.  Ask about respite care resources so that you can have a regular break from the stress of caregiving.  Look for signs of stress in yourself and in the person you are caring for. If you notice signs of stress, take steps to manage it.  Consider any safety risks and take steps to avoid them.  Organize medications in a pill box for each day of the week.  Create a plan to handle any legal or financial matters. Get legal or financial advice if needed.  Keep a calendar in a central location to remind the person of appointments or other activities. Tips for reducing the risk of injury  Keep floors clear of clutter. Remove rugs, magazine racks, and floor lamps.  Keep hallways well lit, especially at night.  Put a handrail and nonslip mat in the bathtub or shower.  Put childproof locks on cabinets that contain dangerous items, such as medicines, alcohol, guns, toxic cleaning items, sharp tools or utensils, matches, and lighters.  Put the locks in places where the person cannot see or reach them easily. This will help ensure that the person does not wander out of the house and get lost.  Be prepared for emergencies. Keep a list of emergency phone numbers and addresses in a convenient area.  Remove car keys and lock garage doors so that the person does not try to get in the car and drive.  Have the person wear a bracelet that tracks locations and identifies the person as having memory  problems. This should be worn at all times for safety. Where to find support: Many individuals and organizations offer support. These include:  Support groups for people with dementia and for caregivers.  Counselors or therapists.  Home health care services.  Adult day care centers. Where to find more information [employer]: [employer] Contact a health care provider if:  The person's health is rapidly getting worse.  You are no longer able to care for the person.  Caring for the person is affecting your physical and emotional health.  The person threatens himself or herself, you, or anyone else. Summary  Dementia is a term used to describe a number of symptoms that affect memory and thinking.  Dementia usually gets worse slowly over time.  Take steps to reduce the person's risk of injury, and to  plan for future care.  Caregivers need support, relief from caregiving, and time for their own lives. This information is not intended to replace advice given to you by your health care provider. Make sure you discuss any questions you have with your health care provider. Document Revised: 04/24/2017 Document Reviewed: 04/15/2016 Elsevier Patient Education  2020 [employer].

## 2020-01-25 NOTE — Progress Notes (Signed)
PATIENT: Sophia Coleman DOB: 1941/05/15  REASON FOR VISIT: follow up HISTORY FROM: patient  Chief Complaint  Patient presents with  . Follow-up    rm 2  . Memory Loss    mmse 24     HISTORY OF PRESENT ILLNESS: Today 01/26/20 Sophia Coleman is a 79 y.o. female here today for follow up for memory loss.  We started her on a trial of BuSpar 5 mg at bedtime in March.  She does feel like this medication has helped significantly with her anxiety.  Her daughter presents with her today and is in agreement.  She feels that she is sleeping better.  She continues to be active.  She is driving without difficulty.  Driving is only in short, local, familiar areas.  Her daughter reports that she does have more irritability when performing difficult task.  She continues to assist her daughter with finances.  Her daughter reports that she will often become irritable and refused to help.  Short-term memory continues to be the biggest concern.  Otherwise, she seems to be doing very well.  HISTORY: (copied from my note on 07/25/2019)  Sophia Coleman is a 79 y.o. female here today for follow up for memory loss. She tried Namenda but did not like how she felt on it and stopped. She feels that it caused nightmares. She also had nightmares on Aricept. Her daughter is with her today and aids in history. She is doing fairly well. She continues to live alone. She is able to perform all ADL's independently. She is eating well. She continues to note anxiety and depression. She continues Paxil daily prescribed by PCP. Her daughter reports that she is having more panic symptoms recently. She gets worked up very quickly. She has family that live across the street and can come when she needs help. They have had to call EMS recently due to panic attacks. She has taken Xanax in the past for anxiety that does help but patient and daughter are hesitant to continue medication.  HISTORY: (copied from my note on  01/18/2019)  Sophia Coleman a 79 y.o.femalehere today for follow up for concerns of memory loss. She has tried Aricept in the past with concerns unwanted dreams. She was advised to consider Namenda at last visit.She feels that this may be a reasonable medication to consider. Admits to suffering from depression. She has been on Paxil 40 mg for many years. She has attempted to switch to a different SSRI in the past but feels that she was unable to tolerate increased anxiety. She lives at home alone. She is able to perform all ADLs. She is able to dose her own medications. She does have family members that check on her regularly and live close by. She continues to drive locally and short distances. There have been no concerns of accidents or getting lost. No falls at home.   HISTORY: (copied fromDr Penumalli'snote on 09/16/2018)  79 year old female here for evaluation of memory loss and confusion.  Patient has had 6 to 12 months of mild short-term memory loss, forgetting recent conversations and events, names of grandchildren, without change in ADLs.  In April 2020 she had a more severe change in symptoms. She had anxiety, shortness of breath, lightheadedness and confusion. Patient went to the hospital on 09/01/2018 for evaluation. Lab testing were unremarkable. She was discharged home.  09/03/2018 patient continued to have symptoms and was very confused and amnestic to her first emergency room visit.  Therefore she went back to the emergency room and had CT scan of the head which was unremarkable.  Since that time patient is doing little bit better.  Patient typically lives alone. Currently her family is with her. She normally is able to take care of all of her ADLs.  Her PCP started her on Aricept 5 mg, but she took this dose and had severe crazy dreams, and she stopped the medication.  Patient has history of emphysema, hypertension, heart disease, carotid artery  disease status post endarterectomy, ongoing tobacco abuse.   REVIEW OF SYSTEMS: Out of a complete 14 system review of symptoms, the patient complains only of the following symptoms, memory loss, anxiety and all other reviewed systems are negative.   ALLERGIES: Allergies  Allergen Reactions  . Vicodin [Hydrocodone-Acetaminophen] Nausea And Vomiting  . Augmentin [Amoxicillin-Pot Clavulanate] Other (See Comments)    "SEVERE SWEATS" Has patient had a PCN reaction causing immediate rash, facial/tongue/throat swelling, SOB or lightheadedness with hypotension: No Has patient had a PCN reaction causing severe rash involving mucus membranes or skin necrosis: No Has patient had a PCN reaction that required hospitalization: No Has patient had a PCN reaction occurring within the last 10 years: Yes If all of the above answers are "NO", then may proceed with Cephalosporin use.   . Quinolones Nausea And Vomiting    HOME MEDICATIONS: Outpatient Medications Prior to Visit  Medication Sig Dispense Refill  . ferrous sulfate 325 (65 FE) MG tablet Take 1 tablet (325 mg total) by mouth daily. 30 tablet 0  . losartan (COZAAR) 100 MG tablet Take 100 mg by mouth every morning.     Marland Kitchen PARoxetine (PAXIL) 40 MG tablet Take 40 mg by mouth every morning.    . busPIRone (BUSPAR) 5 MG tablet Take 1 tablet (5 mg total) by mouth at bedtime. 30 tablet 11   No facility-administered medications prior to visit.    PAST MEDICAL HISTORY: Past Medical History:  Diagnosis Date  . Anemia   . Anxiety   . Arthritis   . Carotid artery occlusion   . Chronic kidney disease   . Colon polyps   . COPD (chronic obstructive pulmonary disease) ([place])   . Depression   . Dyspnea   . Heart murmur   . Hypertension   . Memory difficulty   . Pneumonia   . PONV (postoperative nausea and vomiting)     PAST SURGICAL HISTORY: Past Surgical History:  Procedure Laterality Date  . CAROTID ENDARTERECTOMY    . CATARACT EXTRACTION  W/PHACO Left 01/02/2014   Procedure: CATARACT EXTRACTION PHACO AND INTRAOCULAR LENS PLACEMENT (IOC);  Surgeon: Tonny Branch, MD;  Location: [facility];  Service: Ophthalmology;  Laterality: Left;  CDE 8.17  . CATARACT EXTRACTION W/PHACO Right 03/06/2014   Procedure: CATARACT EXTRACTION PHACO AND INTRAOCULAR LENS PLACEMENT ([place]);  Surgeon: Tonny Branch, MD;  Location: [facility];  Service: Ophthalmology;  Laterality: Right;  CDE:15.14  . CHOLECYSTECTOMY    . COLONOSCOPY  11/13/2011   Procedure: COLONOSCOPY;  Surgeon: Rogene Houston, MD;  Location: [facility];  Service: Endoscopy;  Laterality: N/A;  1030  . COLONOSCOPY N/A 01/22/2017   Procedure: COLONOSCOPY;  Surgeon: Rogene Houston, MD;  [facility];  Service: Endoscopy;  Laterality: N/A;  1030  . ENDARTERECTOMY Left 04/02/2016   Procedure: ENDARTERECTOMY LEFT CAROTID ARTERY;  Surgeon: Rosetta Posner, MD;  Location: [facility];  Service: Vascular;  Laterality: Left;  . PATCH ANGIOPLASTY Left 04/02/2016   Procedure:  PATCH ANGIOPLASTY LEFT CAROTID ARTERY USING HEMASHIELD PLATINUM FINESS PATCH;  Surgeon: Rosetta Posner, MD;  Location[facility] OR;  Service: Vascular;  Laterality: Left;    FAMILY HISTORY: Family History  Problem Relation Age of Onset  . Heart murmur Mother   . Heart Problems Mother   . Heart disease Mother   . Hypertension Mother   . Glaucoma Mother   . Emphysema Father        COPD  . Cancer Sister   . Lung cancer Sister   . Dementia Brother   . Stroke Maternal Grandmother   . Heart attack Maternal Grandfather   . Kidney disease Paternal Grandfather   . Colon cancer Neg Hx     SOCIAL HISTORY: Social History   Socioeconomic History  . Marital status: Widowed    Spouse name: Not on file  . Number of children: 2  . Years of education: Not on file  . Highest education level: Not on file  Occupational History    Comment: retired  Tobacco Use  . Smoking status: Current Every Day Smoker    Packs/day: 1.00    Years: 50.00     Pack years: 50.00    Types: Cigarettes  . Smokeless tobacco: Never Used  Vaping Use  . Vaping Use: Never used  Substance and Sexual Activity  . Alcohol use: No  . Drug use: No  . Sexual activity: Not on file  Other Topics Concern  . Not on file  Social History Narrative   Lives alone   Social Determinants of Health   Financial Resource Strain:   . Difficulty of Paying Living Expenses: Not on file  Food Insecurity:   . Worried About Charity fundraiser in the Last Year: Not on file  . Ran Out of Food in the Last Year: Not on file  Transportation Needs:   . Lack of Transportation (Medical): Not on file  . Lack of Transportation (Non-Medical): Not on file  Physical Activity:   . Days of Exercise per Week: Not on file  . Minutes of Exercise per Session: Not on file  Stress:   . Feeling of Stress : Not on file  Social Connections:   . Frequency of Communication with Friends and Family: Not on file  . Frequency of Social Gatherings with Friends and Family: Not on file  . Attends Religious Services: Not on file  . Active Member of Clubs or Organizations: Not on file  . Attends Archivist Meetings: Not on file  . Marital Status: Not on file  Intimate Partner Violence:   . Fear of Current or Ex-Partner: Not on file  . Emotionally Abused: Not on file  . Physically Abused: Not on file  . Sexually Abused: Not on file      PHYSICAL EXAM  Vitals:   01/25/20 1329  BP: 118/61  Pulse: 73  Weight: 88 lb (39.9 kg)  Height: 4\' 11"  (1.499 m)   Body mass index is 17.77 kg/m.  Generalized: Well developed, in no acute distress  Cardiology: normal rate and rhythm, no murmur noted Respiratory: clear to auscultation bilaterally  Neurological examination  Mentation: Alert oriented to time, place, history taking. Follows all commands speech and language fluent Cranial nerve II-XII: Pupils were equal round reactive to light. Extraocular movements were full, visual field were  full on confrontational test. Facial sensation and strength were normal. Head turning and shoulder shrug  were normal and symmetric. Motor: The motor testing reveals 5  over 5 strength of all 4 extremities. Good symmetric motor tone is noted throughout.  Sensory: Sensory testing is intact to soft touch on all 4 extremities. No evidence of extinction is noted.  Coordination: Cerebellar testing reveals good finger-nose-finger and heel-to-shin bilaterally.  Gait and station: Gait is normal.    DIAGNOSTIC DATA (LABS, IMAGING, TESTING) - I reviewed patient records, labs, notes, testing and imaging myself where available.  MMSE - Mini Mental State Exam 01/25/2020 07/25/2019 01/18/2019  Not completed: - - (No Data)  Orientation to time 4 3 4   Orientation to Place 3 4 2   Registration 3 3 3   Attention/ Calculation 5 0 0  Recall 0 0 0  Language- name 2 objects 2 2 2   Language- repeat 1 1 1   Language- follow 3 step command 3 3 3   Language- read & follow direction 1 1 1   Write a sentence 1 1 1   Copy design 1 1 0  Total score 24 19 17      Lab Results  Component Value Date   WBC 11.5 (H) 07/05/2019   HGB 10.2 (L) 07/05/2019   HCT 33.8 (L) 07/05/2019   MCV 89.9 07/05/2019   PLT 663 (H) 07/05/2019      Component Value Date/Time   NA 135 07/05/2019 1919   K 4.8 07/05/2019 1919   CL 103 07/05/2019 1919   CO2 22 07/05/2019 1919   GLUCOSE 97 07/05/2019 1919   BUN 20 07/05/2019 1919   CREATININE 0.92 07/05/2019 1919   CREATININE 0.89 06/23/2019 1308   CALCIUM 10.0 07/05/2019 1919   PROT 7.5 07/05/2019 1919   ALBUMIN 3.9 07/05/2019 1919   AST 27 07/05/2019 1919   ALT 24 07/05/2019 1919   ALKPHOS 79 07/05/2019 1919   BILITOT 0.6 07/05/2019 1919   GFRNONAA >60 07/05/2019 1919   GFRNONAA 63 06/23/2019 1308   GFRAA >60 07/05/2019 1919   GFRAA 72 06/23/2019 1308   No results found for: CHOL, HDL, LDLCALC, LDLDIRECT, TRIG, CHOLHDL No results found for: HGBA1C Lab Results  Component Value  Date   VITAMINB12 357 06/16/2017   No results found for: TSH     ASSESSMENT AND PLAN 79 y.o. year old female  has a past medical history of Anemia, Anxiety, Arthritis, Carotid artery occlusion, Chronic kidney disease, Colon polyps, COPD (chronic obstructive pulmonary disease) ([place]), Depression, Dyspnea, Heart murmur, Hypertension, Memory difficulty, Pneumonia, and PONV (postoperative nausea and vomiting). here with     ICD-10-CM   1. Memory loss  R41.3   2. Anxiety and depression  F41.9    F32.9     Sophia Coleman presents today and reports improvement in anxiety since last being seen in March.  She is tolerating BuSpar well and without obvious adverse effects.  Her daughter does feel that this is helped significantly with her anxiety.  MMSE in March was 19 of 30, today is 24 of 30.  We have discussed possibility of increasing BuSpar to 5 mg twice daily.  She is also on Paxil 40 mg daily.  I have discussed possible side effects in detail with her and her daughter.  We will trial BuSpar 5 mg twice daily.  They will monitor for any increase in sedate of side effects very closely.  They will call me with any concerns.  We have discussed weight loss since last being seen.  She feels that this is in direct correlation with being more active over the summer.  We will continue to monitor closely.  She will  stay well-hydrated and eat well-balanced meals.  She will continue follow-up with primary care as directed.  She will follow-up with me in 6 months, sooner if needed.  She verbalizes understanding and agreement with this plan.   No orders of the defined types were placed in this encounter.    Meds ordered this encounter  Medications  . busPIRone (BUSPAR) 5 MG tablet    Sig: Take 1 tablet (5 mg total) by mouth 2 (two) times daily.    Dispense:  60 tablet    Refill:  11    Order Specific Question:   Supervising Provider    Answer:   Melvenia Beam [id number]      I spent 30 minutes with the  patient. 50% of this time was spent counseling and educating patient on plan of care and medications.    Debbora Presto, FNP-C 01/26/2020, 7:21 AM [facility] [address] [place], [place] [address] [phone]

## 2020-01-26 ENCOUNTER — Encounter: Payer: Self-pay | Admitting: Family Medicine

## 2020-02-28 NOTE — Progress Notes (Signed)
I reviewed note and agree with plan.   Penni Bombard, MD 01/26/40, 5:93 PM Certified in Neurology, Neurophysiology and Neuroimaging  [facility] [address] [place], [place] [address] [phone]

## 2020-03-19 DIAGNOSIS — Z0000 Encounter for general adult medical examination without abnormal findings: Secondary | ICD-10-CM | POA: Diagnosis not present

## 2020-03-19 DIAGNOSIS — Z23 Encounter for immunization: Secondary | ICD-10-CM | POA: Diagnosis not present

## 2020-03-19 DIAGNOSIS — Z1389 Encounter for screening for other disorder: Secondary | ICD-10-CM | POA: Diagnosis not present

## 2020-03-19 DIAGNOSIS — D509 Iron deficiency anemia, unspecified: Secondary | ICD-10-CM | POA: Diagnosis not present

## 2020-03-19 DIAGNOSIS — Z681 Body mass index (BMI) 19 or less, adult: Secondary | ICD-10-CM | POA: Diagnosis not present

## 2020-03-19 DIAGNOSIS — Z0001 Encounter for general adult medical examination with abnormal findings: Secondary | ICD-10-CM | POA: Diagnosis not present

## 2020-07-03 DIAGNOSIS — R509 Fever, unspecified: Secondary | ICD-10-CM | POA: Diagnosis not present

## 2020-07-03 DIAGNOSIS — R051 Acute cough: Secondary | ICD-10-CM | POA: Diagnosis not present

## 2020-07-03 DIAGNOSIS — M791 Myalgia, unspecified site: Secondary | ICD-10-CM | POA: Diagnosis not present

## 2020-07-05 DIAGNOSIS — J209 Acute bronchitis, unspecified: Secondary | ICD-10-CM | POA: Diagnosis not present

## 2020-07-05 DIAGNOSIS — U071 COVID-19: Secondary | ICD-10-CM | POA: Diagnosis not present

## 2020-07-24 ENCOUNTER — Ambulatory Visit: Payer: Medicare Other | Admitting: Family Medicine

## 2020-07-24 ENCOUNTER — Encounter: Payer: Self-pay | Admitting: Family Medicine

## 2020-07-24 ENCOUNTER — Other Ambulatory Visit: Payer: Self-pay

## 2020-07-24 VITALS — BP 157/74 | HR 64 | Ht 60.0 in | Wt 92.0 lb

## 2020-07-24 DIAGNOSIS — F0280 Dementia in other diseases classified elsewhere without behavioral disturbance: Secondary | ICD-10-CM | POA: Diagnosis not present

## 2020-07-24 DIAGNOSIS — F32A Depression, unspecified: Secondary | ICD-10-CM | POA: Diagnosis not present

## 2020-07-24 DIAGNOSIS — F419 Anxiety disorder, unspecified: Secondary | ICD-10-CM | POA: Diagnosis not present

## 2020-07-24 DIAGNOSIS — G301 Alzheimer's disease with late onset: Secondary | ICD-10-CM

## 2020-07-24 MED ORDER — MEMANTINE HCL 5 MG PO TABS: 5.0000 mg | ORAL_TABLET | Freq: Every day | ORAL | 1 refills | Status: DC

## 2020-07-24 NOTE — Patient Instructions (Signed)
Below is our plan:  We will continue Buspar (buspirone) 5mg  twice daily. I will add Namenda (memantine) 5mg  daily at bedtime.   Please make sure you are staying well hydrated. I recommend 50-60 ounces daily. Well balanced diet and regular exercise encouraged. Consistent sleep schedule with 6-8 hours recommended.   Please continue follow up with care team as directed.   Follow up with me in 3 months   You may receive a survey regarding today's visit. I encourage you to leave honest feed back as I do use this information to improve patient care. Thank you for seeing me today!      Dementia Caregiver Guide Dementia is a term used to describe a number of symptoms that affect memory and thinking. The most common symptoms include:  Memory loss.  Trouble with language and communication.  Trouble concentrating.  Poor judgment and problems with reasoning.  Wandering from home or public places.  Extreme anxiety or depression.  Being suspicious or having angry outbursts and accusations.  Child-like behavior and language. Dementia can be frightening and confusing. And taking care of someone with dementia can be challenging. This guide provides tips to help you when providing care for a person with dementia. How to help manage lifestyle changes Dementia usually gets worse slowly over time. In the early stages, people with dementia can stay independent and safe with some help. In later stages, they need help with daily tasks such as dressing, grooming, and using the bathroom. There are actions you can take to help a person manage his or her life while living with this condition. Communicating  When the person is talking or seems frustrated, make eye contact and hold the person's hand.  Ask specific questions that need yes or no answers.  Use simple words, short sentences, and a calm voice. Only give one direction at a time.  When offering choices, limit the person to just one or  two.  Avoid correcting the person in a negative way.  If the person is struggling to find the right words, gently try to help him or her. Preventing injury  Keep floors clear of clutter. Remove rugs, magazine racks, and floor lamps.  Keep hallways well lit, especially at night.  Put a handrail and nonslip mat in the bathtub or shower.  Put childproof locks on cabinets that contain dangerous items, such as medicines, alcohol, guns, toxic cleaning items, sharp tools or utensils, matches, and lighters.  For doors to the outside of the house, put the locks in places where the person cannot see or reach them easily. This will help ensure that the person does not wander out of the house and get lost.  Be prepared for emergencies. Keep a list of emergency phone numbers and addresses in a convenient area.  Remove car keys and lock garage doors so that the person does not try to get in the car and drive.  Have the person wear a bracelet that tracks locations and identifies the person as having memory problems. This should be worn at all times for safety.   Helping with daily life  Keep the person on track with his or her routine.  Try to identify areas where the person may need help.  Be supportive, patient, calm, and encouraging.  Gently remind the person that adjusting to changes takes time.  Help with the tasks that the person has asked for help with.  Keep the person involved in daily tasks and decisions as much as possible.  Encourage conversation, but try not to get frustrated if the person struggles to find words or does not seem to appreciate your help.   How to recognize stress Look for signs of stress in yourself and in the person you are caring for. If you notice signs of stress, take steps to manage it. Symptoms of stress include:  Feeling anxious, irritable, frustrated, or angry.  Denying that the person has dementia or that his or her symptoms will not  improve.  Feeling depressed, hopeless, or unappreciated.  Difficulty sleeping.  Difficulty concentrating.  Developing stress-related health problems.  Feeling like you have too little time for your own life. Follow these instructions at home: Take care of your health Make sure that you and the person you are caring for:  Get regular sleep.  Exercise regularly.  Eat regular, nutritious meals.  Take over-the-counter and prescription medicines only as told by your health care providers.  Drink enough fluid to keep your urine pale yellow.  Attend all scheduled health care appointments.   General instructions  Join a support group with others who are caregivers.  Ask about respite care resources. Respite care can provide short-term care for the person so that you can have a regular break from the stress of caregiving.  Consider any safety risks and take steps to avoid them.  Organize medicines in a pill box for each day of the week.  Create a plan to handle any legal or financial matters. Get legal or financial advice if needed.  Keep a calendar in a central location to remind the person of appointments or other activities. Where to find support: Many individuals and organizations offer support. These include:  Support groups for people with dementia.  Support groups for caregivers.  Counselors or therapists.  Home health care services.  Adult day care centers. Where to find more information  Centers for Disease Control and Prevention: http://www.wolf.info/  Alzheimer's Association: [place].co.nz  Family Caregiver Alliance: www.caregiver.[place]: www.alzfdn.org Contact a health care provider if:  The person's health is rapidly getting worse.  You are no longer able to care for the person.  Caring for the person is affecting your physical and emotional health.  You are feeling depressed or anxious about caring for the person. Get help  right away if:  The person threatens himself or herself, you, or anyone else.  You feel depressed or sad, or feel that you want to harm yourself. If you ever feel like your loved one may hurt himself or herself or others, or if he or she shares thoughts about taking his or her own life, get help right away. You can go to your nearest emergency department or:  Call your local emergency services (911 in the [place]  Call a suicide crisis helpline, such as the [place] at 562-613-8592. This is open 24 hours a day in the [place]  Text the Crisis Text Line at (319)087-7622 (in the [place].). Summary  Dementia is a term used to describe a number of symptoms that affect memory and thinking.  Dementia usually gets worse slowly over time.  Take steps to reduce the person's risk of injury and to plan for future care.  Caregivers need support, relief from caregiving, and time for their own lives. This information is not intended to replace advice given to you by your health care provider. Make sure you discuss any questions you have with your health care provider. Document Revised: 09/26/2019 Document Reviewed:  09/26/2019 Elsevier Patient Education  2021 [employer] Strategies  1. Use "WARM" strategy.  W= write it down  A= associate it  R= repeat it  M= make a mental note  2.   You can keep a Social worker.  Use a 3-ring notebook with sections for the following: calendar, important names and phone numbers,  medications, doctors' names/phone numbers, lists/reminders, and a section to journal what you did  each day.   3.    Use a calendar to write appointments down.  4.    Write yourself a schedule for the day.  This can be placed on the calendar or in a separate section of the Memory Notebook.  Keeping a  regular schedule can help memory.  5.    Use medication organizer with sections for each day or morning/evening pills.  You may need help  loading it  6.    Keep a basket, or pegboard by the door.  Place items that you need to take out with you in the basket or on the pegboard.  You may also want to  include a message board for reminders.  7.    Use sticky notes.  Place sticky notes with reminders in a place where the task is performed.  For example: " turn off the  stove" placed by the stove, "lock the door" placed on the door at eye level, " take your medications" on  the bathroom mirror or by the place where you normally take your medications.  8.    Use alarms/timers.  Use while cooking to remind yourself to check on food or as a reminder to take your medicine, or as a  reminder to make a call, or as a reminder to perform another task, etc.

## 2020-07-24 NOTE — Progress Notes (Signed)
PATIENT: Sophia Coleman DOB: 02-Oct-1940  REASON FOR VISIT: follow up HISTORY FROM: patient  Chief Complaint  Patient presents with  . Follow-up    RM 1 With daughter(tammy) PT thinks memory is about the same, daughter states it has declined..  She has forgotten that I have taken her weight and didn't know what BP was checking for      HISTORY OF PRESENT ILLNESS: 07/24/20 ALL:  She is doing fairly well, today. No significant worsening. Her daughter does feel that short term memory continues to progress. She has had more concerns of anxiety attacks over the past month. Her daughter reports that she is calling her or her sister in law every morning stating that something is wrong. And that she needs help right away. By the time someone gets to her, she doesn't remember calling. It has been harder for her to shop independently. Her daughter is helping with grocery shopping. She rarely drives. She is seeing PCP annually. She continues Paxil 40mg  daily. She has had nightmares with Aricept. She reports trying Namenda in the past but her daughter does not think she ever started it.   01/25/2020 ALL:  Sophia Coleman is a 80 y.o. female here today for follow up for memory loss.  We started her on a trial of BuSpar 5 mg at bedtime in March.  She does feel like this medication has helped significantly with her anxiety.  Her daughter presents with her today and is in agreement.  She feels that she is sleeping better.  She continues to be active.  She is driving without difficulty.  Driving is only in short, local, familiar areas.  Her daughter reports that she does have more irritability when performing difficult task.  She continues to assist her daughter with finances.  Her daughter reports that she will often become irritable and refused to help.  Short-term memory continues to be the biggest concern.  Otherwise, she seems to be doing very well.  HISTORY: (copied from my note on 07/25/2019)  Sophia Coleman is a 80 y.o. female here today for follow up for memory loss. She tried Namenda but did not like how she felt on it and stopped. She feels that it caused nightmares. She also had nightmares on Aricept. Her daughter is with her today and aids in history. She is doing fairly well. She continues to live alone. She is able to perform all ADL's independently. She is eating well. She continues to note anxiety and depression. She continues Paxil daily prescribed by PCP. Her daughter reports that she is having more panic symptoms recently. She gets worked up very quickly. She has family that live across the street and can come when she needs help. They have had to call EMS recently due to panic attacks. She has taken Xanax in the past for anxiety that does help but patient and daughter are hesitant to continue medication.  HISTORY: (copied from my note on 01/18/2019)  Sophia Coleman a 80 y.o.femalehere today for follow up for concerns of memory loss. She has tried Aricept in the past with concerns unwanted dreams. She was advised to consider Namenda at last visit.She feels that this may be a reasonable medication to consider. Admits to suffering from depression. She has been on Paxil 40 mg for many years. She has attempted to switch to a different SSRI in the past but feels that she was unable to tolerate increased anxiety. She lives at home alone.  She is able to perform all ADLs. She is able to dose her own medications. She does have family members that check on her regularly and live close by. She continues to drive locally and short distances. There have been no concerns of accidents or getting lost. No falls at home.   HISTORY: (copied fromDr Penumalli'snote on 09/16/2018)  80 year old female here for evaluation of memory loss and confusion.  Patient has had 6 to 12 months of mild short-term memory loss, forgetting recent conversations and events, names of grandchildren,  without change in ADLs.  In April 2020 she had a more severe change in symptoms. She had anxiety, shortness of breath, lightheadedness and confusion. Patient went to the hospital on 09/01/2018 for evaluation. Lab testing were unremarkable. She was discharged home.  09/03/2018 patient continued to have symptoms and was very confused and amnestic to her first emergency room visit. Therefore she went back to the emergency room and had CT scan of the head which was unremarkable.  Since that time patient is doing little bit better.  Patient typically lives alone. Currently her family is with her. She normally is able to take care of all of her ADLs.  Her PCP started her on Aricept 5 mg, but she took this dose and had severe crazy dreams, and she stopped the medication.  Patient has history of emphysema, hypertension, heart disease, carotid artery disease status post endarterectomy, ongoing tobacco abuse.   REVIEW OF SYSTEMS: Out of a complete 14 system review of symptoms, the patient complains only of the following symptoms, memory loss, anxiety and all other reviewed systems are negative.   ALLERGIES: Allergies  Allergen Reactions  . Vicodin [Hydrocodone-Acetaminophen] Nausea And Vomiting  . Augmentin [Amoxicillin-Pot Clavulanate] Other (See Comments)    "SEVERE SWEATS" Has patient had a PCN reaction causing immediate rash, facial/tongue/throat swelling, SOB or lightheadedness with hypotension: No Has patient had a PCN reaction causing severe rash involving mucus membranes or skin necrosis: No Has patient had a PCN reaction that required hospitalization: No Has patient had a PCN reaction occurring within the last 10 years: Yes If all of the above answers are "NO", then may proceed with Cephalosporin use.   . Quinolones Nausea And Vomiting    HOME MEDICATIONS: Outpatient Medications Prior to Visit  Medication Sig Dispense Refill  . busPIRone (BUSPAR) 5 MG tablet Take 1 tablet  (5 mg total) by mouth 2 (two) times daily. 60 tablet 11  . ferrous sulfate 325 (65 FE) MG tablet Take 1 tablet (325 mg total) by mouth daily. 30 tablet 0  . losartan (COZAAR) 100 MG tablet Take 100 mg by mouth every morning.     Marland Kitchen PARoxetine (PAXIL) 40 MG tablet Take 40 mg by mouth every morning.     No facility-administered medications prior to visit.    PAST MEDICAL HISTORY: Past Medical History:  Diagnosis Date  . Anemia   . Anxiety   . Arthritis   . Carotid artery occlusion   . Chronic kidney disease   . Colon polyps   . COPD (chronic obstructive pulmonary disease) ([place])   . Depression   . Dyspnea   . Heart murmur   . Hypertension   . Memory difficulty   . Pneumonia   . PONV (postoperative nausea and vomiting)     PAST SURGICAL HISTORY: Past Surgical History:  Procedure Laterality Date  . CAROTID ENDARTERECTOMY    . CATARACT EXTRACTION W/PHACO Left 01/02/2014   Procedure: CATARACT EXTRACTION PHACO AND INTRAOCULAR  LENS PLACEMENT (I[facility]);  Surgeon: Tonny Branch, MD;  Location: [facility];  Service: Ophthalmology;  Laterality: Left;  CDE 8.17  . CATARACT EXTRACTION W/PHACO Right 03/06/2014   Procedure: CATARACT EXTRACTION PHACO AND INTRAOCULAR LENS PLACEMENT ([place]);  Surgeon: Tonny Branch, MD;  Location: [facility];  Service: Ophthalmology;  Laterality: Right;  CDE:15.14  . CHOLECYSTECTOMY    . COLONOSCOPY  11/13/2011   Procedure: COLONOSCOPY;  Surgeon: Rogene Houston, MD;  Location: [facility];  Service: Endoscopy;  Laterality: N/A;  1030  . COLONOSCOPY N/A 01/22/2017   Procedure: COLONOSCOPY;  Surgeon: Rogene Houston, MD;  Location: [facility];  Service: Endoscopy;  Laterality: N/A;  1030  . ENDARTERECTOMY Left 04/02/2016   Procedure: ENDARTERECTOMY LEFT CAROTID ARTERY;  Surgeon: Rosetta Posner, MD;  Location: [facility];  Service: Vascular;  Laterality: Left;  . PATCH ANGIOPLASTY Left 04/02/2016   Procedure: PATCH ANGIOPLASTY LEFT CAROTID ARTERY USING HEMASHIELD PLATINUM FINESS PATCH;   Surgeon: Rosetta Posner, MD;  [facility];  Service: Vascular;  Laterality: Left;    FAMILY HISTORY: Family History  Problem Relation Age of Onset  . Heart murmur Mother   . Heart Problems Mother   . Heart disease Mother   . Hypertension Mother   . Glaucoma Mother   . Emphysema Father        COPD  . Cancer Sister   . Lung cancer Sister   . Dementia Brother   . Stroke Maternal Grandmother   . Heart attack Maternal Grandfather   . Kidney disease Paternal Grandfather   . Colon cancer Neg Hx     SOCIAL HISTORY: Social History   Socioeconomic History  . Marital status: Widowed    Spouse name: Not on file  . Number of children: 2  . Years of education: Not on file  . Highest education level: Not on file  Occupational History    Comment: retired  Tobacco Use  . Smoking status: Current Every Day Smoker    Packs/day: 1.00    Years: 50.00    Pack years: 50.00    Types: Cigarettes  . Smokeless tobacco: Never Used  Vaping Use  . Vaping Use: Never used  Substance and Sexual Activity  . Alcohol use: No  . Drug use: No  . Sexual activity: Not on file  Other Topics Concern  . Not on file  Social History Narrative   Lives alone   Social Determinants of Health   Financial Resource Strain: Not on file  Food Insecurity: Not on file  Transportation Needs: Not on file  Physical Activity: Not on file  Stress: Not on file  Social Connections: Not on file  Intimate Partner Violence: Not on file      PHYSICAL EXAM  Vitals:   07/24/20 1311  BP: (!) 157/74  Pulse: 64  Weight: 92 lb (41.7 kg)  Height: 5' (1.524 m)   Body mass index is 17.97 kg/m.  Generalized: Well developed, in no acute distress  Cardiology: normal rate and rhythm, no murmur noted Respiratory: clear to auscultation bilaterally  Neurological examination  Mentation: Alert oriented to time, place, history taking. Follows all commands speech and language fluent Cranial nerve II-XII: Pupils were  equal round reactive to light. Extraocular movements were full, visual field were full on confrontational test. Facial sensation and strength were normal. Head turning and shoulder shrug  were normal and symmetric. Motor: The motor testing reveals 5 over 5 strength of all 4 extremities. Good symmetric  motor tone is noted throughout.  Sensory: Sensory testing is intact to soft touch on all 4 extremities. No evidence of extinction is noted.  Coordination: Cerebellar testing reveals good finger-nose-finger and heel-to-shin bilaterally.  Gait and station: Gait is normal.    DIAGNOSTIC DATA (LABS, IMAGING, TESTING) - I reviewed patient records, labs, notes, testing and imaging myself where available.  MMSE - Mini Mental State Exam 07/24/2020 01/25/2020 07/25/2019  Not completed: - - -  Orientation to time 1 4 3   Orientation to Place 4 3 4   Registration 3 3 3   Attention/ Calculation 2 5 0  Recall 2 0 0  Language- name 2 objects 1 2 2   Language- repeat 1 1 1   Language- follow 3 step command 3 3 3   Language- read & follow direction 1 1 1   Write a sentence 1 1 1   Copy design 0 1 1  Total score 19 24 19      Lab Results  Component Value Date   WBC 11.5 (H) 07/05/2019   HGB 10.2 (L) 07/05/2019   HCT 33.8 (L) 07/05/2019   MCV 89.9 07/05/2019   PLT 663 (H) 07/05/2019      Component Value Date/Time   NA 135 07/05/2019 1919   K 4.8 07/05/2019 1919   CL 103 07/05/2019 1919   CO2 22 07/05/2019 1919   GLUCOSE 97 07/05/2019 1919   BUN 20 07/05/2019 1919   CREATININE 0.92 07/05/2019 1919   CREATININE 0.89 06/23/2019 1308   CALCIUM 10.0 07/05/2019 1919   PROT 7.5 07/05/2019 1919   ALBUMIN 3.9 07/05/2019 1919   AST 27 07/05/2019 1919   ALT 24 07/05/2019 1919   ALKPHOS 79 07/05/2019 1919   BILITOT 0.6 07/05/2019 1919   GFRNONAA >60 07/05/2019 1919   GFRNONAA 63 06/23/2019 1308   GFRAA >60 07/05/2019 1919   GFRAA 72 06/23/2019 1308   No results found for: CHOL, HDL, LDLCALC, LDLDIRECT, TRIG,  CHOLHDL No results found for: HGBA1C Lab Results  Component Value Date   VITAMINB12 357 06/16/2017   No results found for: TSH     ASSESSMENT AND PLAN 80 y.o. year old female  has a past medical history of Anemia, Anxiety, Arthritis, Carotid artery occlusion, Chronic kidney disease, Colon polyps, COPD (chronic obstructive pulmonary disease) ([place]), Depression, Dyspnea, Heart murmur, Hypertension, Memory difficulty, Pneumonia, and PONV (postoperative nausea and vomiting). here with   No diagnosis found.   Sophia Coleman is doing fairly well, today. Short term memory is progressively worsening. She has had more trouble, recently with anxiety in the mornings. It is unclear if she is taking Buspar twice daily. Her daughter is looking into having a caregiver check in on Sophia Coleman in the mornings. I have encouraged her to look into memory care benefits with insurer. She does seem better on Buspar. We will continue 5mg  twice daily. We will restart Namenda 5mg  at bedtime. Her daughter will help with medication administration and monitor for side effects. She will stay well-hydrated and eat well-balanced meals.  She will continue follow-up with primary care as directed.  She will follow-up with me in 3 months, sooner if needed.  She verbalizes understanding and agreement with this plan.   No orders of the defined types were placed in this encounter.    Meds ordered this encounter  Medications  . memantine (NAMENDA) 5 MG tablet    Sig: Take 1 tablet (5 mg total) by mouth daily.    Dispense:  90 tablet    Refill:  1    Order Specific Question:   Supervising Provider    Answer:   Melvenia Beam [[id number]]      I spent 30 minutes with the patient. 50% of this time was spent counseling and educating patient on plan of care and medications.    Debbora Presto, FNP-C 07/24/2020, 2:09 PM [facility] [address] [place],  [address] [phone]

## 2020-07-25 NOTE — Progress Notes (Signed)
I reviewed note and agree with plan.   Penni Bombard, MD 10/27/8683, 4:88 PM Certified in Neurology, Neurophysiology and Neuroimaging  [facility] [address] [place], [place] [address] [phone]

## 2020-11-07 ENCOUNTER — Ambulatory Visit: Payer: Medicare Other | Admitting: Family Medicine

## 2020-12-25 ENCOUNTER — Other Ambulatory Visit (HOSPITAL_COMMUNITY): Payer: Self-pay | Admitting: Family Medicine

## 2020-12-25 DIAGNOSIS — Z1231 Encounter for screening mammogram for malignant neoplasm of breast: Secondary | ICD-10-CM

## 2020-12-27 ENCOUNTER — Other Ambulatory Visit (HOSPITAL_COMMUNITY): Payer: Self-pay | Admitting: Family Medicine

## 2020-12-27 DIAGNOSIS — Z1231 Encounter for screening mammogram for malignant neoplasm of breast: Secondary | ICD-10-CM

## 2021-01-02 ENCOUNTER — Inpatient Hospital Stay (HOSPITAL_COMMUNITY): Admission: RE | Admit: 2021-01-02 | Payer: Medicare Other | Source: Ambulatory Visit

## 2021-01-03 NOTE — Progress Notes (Signed)
PATIENT: Sophia Coleman DOB: 1940-12-08  REASON FOR VISIT: follow up HISTORY FROM: patient  Chief Complaint  Patient presents with   Follow-up    Pt with daughter, new rm. Memory seems to be stable.       HISTORY OF PRESENT ILLNESS: 01/07/21 ALL:  Stone returns for follow up for AD. She presents with her daughter, Sophia Coleman, who aids in history. We continued Buspar twice daily and added memantine '5mg'$  BID at last visit 07/2020. Sophia Coleman reports that she has taken memantin '5mg'$  daily and is tolerating well. Sophia Coleman organizes her medicine container weekly and is concerned that Sophia Coleman will not be able to remember taking medicaitons twice daily. She leaves buspirone on window seal and usually just takes it at night. Memory seems stable. She rarely drives. She is eating normally. Able to perform all ADLs. No falls.   07/24/2020 ALL:  She is doing fairly well, today. No significant worsening. Her daughter does feel that short term memory continues to progress. She has had more concerns of anxiety attacks over the past month. Her daughter reports that she is calling her or her sister in law every morning stating that something is wrong. And that she needs help right away. By the time someone gets to her, she doesn't remember calling. It has been harder for her to shop independently. Her daughter is helping with grocery shopping. She rarely drives. She is seeing PCP annually. She continues Paxil '40mg'$  daily. She has had nightmares with Aricept. She reports trying Namenda in the past but her daughter does not think she ever started it.   01/25/2020 ALL:  Sophia Coleman is a 80 y.o. female here today for follow up for memory loss.  We started her on a trial of BuSpar 5 mg at bedtime in March.  She does feel like this medication has helped significantly with her anxiety.  Her daughter presents with her today and is in agreement.  She feels that she is sleeping better.  She continues to be active.  She is driving  without difficulty.  Driving is only in short, local, familiar areas.  Her daughter reports that she does have more irritability when performing difficult task.  She continues to assist her daughter with finances.  Her daughter reports that she will often become irritable and refused to help.  Short-term memory continues to be the biggest concern.  Otherwise, she seems to be doing very well.  HISTORY: (copied from my note on 07/25/2019)  Sophia Coleman is a 80 y.o. female here today for follow up for memory loss. She tried Namenda but did not like how she felt on it and stopped. She feels that it caused nightmares. She also had nightmares on Aricept. Her daughter is with her today and aids in history. She is doing fairly well. She continues to live alone. She is able to perform all ADL's independently. She is eating well. She continues to note anxiety and depression. She continues Paxil daily prescribed by PCP. Her daughter reports that she is having more panic symptoms recently. She gets worked up very quickly. She has family that live across the street and can come when she needs help. They have had to call EMS recently due to panic attacks. She has taken Xanax in the past for anxiety that does help but patient and daughter are hesitant to continue medication.   HISTORY: (copied from my note on 01/18/2019)   Sophia Coleman is a 80 y.o. female  here today for follow up for concerns of memory loss. She has tried Aricept in the past with concerns unwanted dreams. She was advised to consider Namenda at last visit.  She feels that this may be a reasonable medication to consider.  Admits to suffering from depression.  She has been on Paxil 40 mg for many years.  She has attempted to switch to a different SSRI in the past but feels that she was unable to tolerate increased anxiety.  She lives at home alone.  She is able to perform all ADLs.  She is able to dose her own medications.  She does have family members  that check on her regularly and live close by.  She continues to drive locally and short distances.  There have been no concerns of accidents or getting lost.  No falls at home.     HISTORY: (copied from Dr Gladstone Lighter note on 09/16/2018)   80 year old female here for evaluation of memory loss and confusion.   Patient has had 6 to 12 months of mild short-term memory loss, forgetting recent conversations and events, names of grandchildren, without change in ADLs.   In April 2020 she had a more severe change in symptoms.  She had anxiety, shortness of breath, lightheadedness and confusion.  Patient went to the hospital on 09/01/2018 for evaluation.  Lab testing were unremarkable.  She was discharged home.   09/03/2018 patient continued to have symptoms and was very confused and amnestic to her first emergency room visit.  Therefore she went back to the emergency room and had CT scan of the head which was unremarkable.   Since that time patient is doing little bit better.   Patient typically lives alone.  Currently her family is with her.  She normally is able to take care of all of her ADLs.   Her PCP started her on Aricept 5 mg, but she took this dose and had severe crazy dreams, and she stopped the medication.   Patient has history of emphysema, hypertension, heart disease, carotid artery disease status post endarterectomy, ongoing tobacco abuse.   REVIEW OF SYSTEMS: Out of a complete 14 system review of symptoms, the patient complains only of the following symptoms, memory loss, anxiety and all other reviewed systems are negative.   ALLERGIES: Allergies  Allergen Reactions   Vicodin [Hydrocodone-Acetaminophen] Nausea And Vomiting   Augmentin [Amoxicillin-Pot Clavulanate] Other (See Comments)    "SEVERE SWEATS" Has patient had a PCN reaction causing immediate rash, facial/tongue/throat swelling, SOB or lightheadedness with hypotension: No Has patient had a PCN reaction causing severe rash  involving mucus membranes or skin necrosis: No Has patient had a PCN reaction that required hospitalization: No Has patient had a PCN reaction occurring within the last 10 years: Yes If all of the above answers are "NO", then may proceed with Cephalosporin use.    Quinolones Nausea And Vomiting    HOME MEDICATIONS: Outpatient Medications Prior to Visit  Medication Sig Dispense Refill   busPIRone (BUSPAR) 5 MG tablet Take 1 tablet (5 mg total) by mouth 2 (two) times daily. 60 tablet 11   ferrous sulfate 325 (65 FE) MG tablet Take 1 tablet (325 mg total) by mouth daily. 30 tablet 0   losartan (COZAAR) 100 MG tablet Take 100 mg by mouth every morning.      PARoxetine (PAXIL) 40 MG tablet Take 40 mg by mouth every morning.     memantine (NAMENDA) 5 MG tablet Take 1 tablet (5 mg total)  by mouth daily. 90 tablet 1   No facility-administered medications prior to visit.    PAST MEDICAL HISTORY: Past Medical History:  Diagnosis Date   Anemia    Anxiety    Arthritis    Carotid artery occlusion    Chronic kidney disease    Colon polyps    COPD (chronic obstructive pulmonary disease) (HCC)    Depression    Dyspnea    Heart murmur    Hypertension    Memory difficulty    Pneumonia    PONV (postoperative nausea and vomiting)     PAST SURGICAL HISTORY: Past Surgical History:  Procedure Laterality Date   CAROTID ENDARTERECTOMY     CATARACT EXTRACTION W/PHACO Left 01/02/2014   Procedure: CATARACT EXTRACTION PHACO AND INTRAOCULAR LENS PLACEMENT ([place]);  Surgeon: Tonny Branch, MD;  Location: [facility];  Service: Ophthalmology;  Laterality: Left;  CDE 8.17   CATARACT EXTRACTION W/PHACO Right 03/06/2014   Procedure: CATARACT EXTRACTION PHACO AND INTRAOCULAR LENS PLACEMENT ([facility]);  Surgeon: Tonny Branch, MD;  Location: [facility];  Service: Ophthalmology;  Laterality: Right;  CDE:15.14   CHOLECYSTECTOMY     COLONOSCOPY  11/13/2011   Procedure: COLONOSCOPY;  Surgeon: Rogene Houston, MD;  Location: [facility];  Service: Endoscopy;  Laterality: N/A;  1030   COLONOSCOPY N/A 01/22/2017   Procedure: COLONOSCOPY;  Surgeon: Rogene Houston, MD;  Location: [facility];  Service: Endoscopy;  Laterality: N/A;  1030   ENDARTERECTOMY Left 04/02/2016   Procedure: ENDARTERECTOMY LEFT CAROTID ARTERY;  Surgeon: Rosetta Posner, MD;  Location: [facility];  Service: Vascular;  Laterality: Left;   PATCH ANGIOPLASTY Left 04/02/2016   Procedure: PATCH ANGIOPLASTY LEFT CAROTID ARTERY USING HEMASHIELD PLATINUM FINESS PATCH;  Surgeon: Rosetta Posner, MD;  Location[facility];  Service: Vascular;  Laterality: Left;    FAMILY HISTORY: Family History  Problem Relation Age of Onset   Heart murmur Mother    Heart Problems Mother    Heart disease Mother    Hypertension Mother    Glaucoma Mother    Emphysema Father        COPD   Cancer Sister    Lung cancer Sister    Dementia Brother    Stroke Maternal Grandmother    Heart attack Maternal Grandfather    Kidney disease Paternal Grandfather    Colon cancer Neg Hx     SOCIAL HISTORY: Social History   Socioeconomic History   Marital status: Widowed    Spouse name: Not on file   Number of children: 2   Years of education: Not on file   Highest education level: Not on file  Occupational History    Comment: retired  Tobacco Use   Smoking status: Every Day    Packs/day: 1.00    Years: 50.00    Pack years: 50.00    Types: Cigarettes   Smokeless tobacco: Never  Vaping Use   Vaping Use: Never used  Substance and Sexual Activity   Alcohol use: No   Drug use: No   Sexual activity: Not on file  Other Topics Concern   Not on file  Social History Narrative   Lives alone   Social Determinants of Health   Financial Resource Strain: Not on file  Food Insecurity: Not on file  Transportation Needs: Not on file  Physical Activity: Not on file  Stress: Not on file  Social Connections: Not on file  Intimate Partner Violence: Not on file  PHYSICAL  EXAM  Vitals:   01/07/21 1051  BP: (!) 155/78  Pulse: 64  Weight: 93 lb (42.2 kg)  Height: 5' (1.524 m)    Body mass index is 18.16 kg/m.  Generalized: Well developed, in no acute distress  Cardiology: normal rate and rhythm, no murmur noted Respiratory: clear to auscultation bilaterally  Neurological examination  Mentation: Alert oriented to time, place, history taking. Follows all commands speech and language fluent Cranial nerve II-XII: Pupils were equal round reactive to light. Extraocular movements were full, visual field were full on confrontational test. Facial sensation and strength were normal. Head turning and shoulder shrug  were normal and symmetric. Motor: The motor testing reveals 5 over 5 strength of all 4 extremities. Good symmetric motor tone is noted throughout.  Gait and station: Gait is normal.    DIAGNOSTIC DATA (LABS, IMAGING, TESTING) - I reviewed patient records, labs, notes, testing and imaging myself where available.  MMSE - Mini Mental State Exam 01/07/2021 07/24/2020 01/25/2020  Not completed: - - -  Orientation to time '2 1 4  '$ Orientation to Place '4 4 3  '$ Registration '3 3 3  '$ Attention/ Calculation '3 2 5  '$ Recall 0 2 0  Language- name 2 objects '2 1 2  '$ Language- repeat '1 1 1  '$ Language- follow 3 step command '3 3 3  '$ Language- read & follow direction '1 1 1  '$ Write a sentence '1 1 1  '$ Copy design 0 0 1  Total score '20 19 24     '$ Lab Results  Component Value Date   WBC 11.5 (H) 07/05/2019   HGB 10.2 (L) 07/05/2019   HCT 33.8 (L) 07/05/2019   MCV 89.9 07/05/2019   PLT 663 (H) 07/05/2019      Component Value Date/Time   NA 135 07/05/2019 1919   K 4.8 07/05/2019 1919   CL 103 07/05/2019 1919   CO2 22 07/05/2019 1919   GLUCOSE 97 07/05/2019 1919   BUN 20 07/05/2019 1919   CREATININE 0.92 07/05/2019 1919   CREATININE 0.89 06/23/2019 1308   CALCIUM 10.0 07/05/2019 1919   PROT 7.5 07/05/2019 1919   ALBUMIN 3.9 07/05/2019 1919   AST 27 07/05/2019 1919    ALT 24 07/05/2019 1919   ALKPHOS 79 07/05/2019 1919   BILITOT 0.6 07/05/2019 1919   GFRNONAA >60 07/05/2019 1919   GFRNONAA 63 06/23/2019 1308   GFRAA >60 07/05/2019 1919   GFRAA 72 06/23/2019 1308   No results found for: CHOL, HDL, LDLCALC, LDLDIRECT, TRIG, CHOLHDL No results found for: HGBA1C Lab Results  Component Value Date   VITAMINB12 357 06/16/2017   No results found for: TSH     ASSESSMENT AND PLAN 80 y.o. year old female  has a past medical history of Anemia, Anxiety, Arthritis, Carotid artery occlusion, Chronic kidney disease, Colon polyps, COPD (chronic obstructive pulmonary disease) ([place]), Depression, Dyspnea, Heart murmur, Hypertension, Memory difficulty, Pneumonia, and PONV (postoperative nausea and vomiting). here with     ICD-10-CM   1. Late onset Alzheimer's dementia without behavioral disturbance (HCC)  G30.1    F02.80     2. Anxiety and depression  F41.9    F32.A        Sophia Coleman is doing fairly well, today. Memory seems fairly stable. She does seem better on buspirone. We will continue '5mg'$  up to twice daily. We will increase memantine to 10 daily.. Her daughter will help with medication administration and monitor for side effects. We have discussed need for twice  daily dosing. Sophia Coleman will check on getting pill packs with her pharmacy. She will stay well-hydrated and eat well-balanced meals.  Caution with driving. She will continue follow-up with primary care as directed.  She will follow-up with me in 6 months, sooner if needed.  She verbalizes understanding and agreement with this plan.   No orders of the defined types were placed in this encounter.    Meds ordered this encounter  Medications   memantine (NAMENDA) 10 MG tablet    Sig: Take 1 tablet (10 mg total) by mouth daily.    Dispense:  90 tablet    Refill:  1    Order Specific Question:   Supervising Provider    Answer:   Bess Harvest, FNP-C 01/07/2021, 12:01  PM [facility] [address], [place], [place] [address] [phone]

## 2021-01-03 NOTE — Patient Instructions (Signed)
Below is our plan:  We will continue buspirone '5mg'$  up to twice daily. Increase memantine to '10mg'$  daily. We will discuss increasing to twice daily in the future. May consider getting medicaitons dispensed in pill packs in the future.   Please make sure you are staying well hydrated. I recommend 50-60 ounces daily. Well balanced diet and regular exercise encouraged. Consistent sleep schedule with 6-8 hours recommended.   Please continue follow up with care team as directed.   Follow up with me in 6 months   You may receive a survey regarding today's visit. I encourage you to leave honest feed back as I do use this information to improve patient care. Thank you for seeing me today!

## 2021-01-07 ENCOUNTER — Ambulatory Visit (HOSPITAL_COMMUNITY)
Admission: RE | Admit: 2021-01-07 | Discharge: 2021-01-07 | Disposition: A | Discharge status: Home / Self Care | Payer: Medicare Other | Source: Ambulatory Visit | Attending: Family Medicine | Admitting: Family Medicine

## 2021-01-07 ENCOUNTER — Other Ambulatory Visit: Payer: Self-pay

## 2021-01-07 ENCOUNTER — Ambulatory Visit: Payer: Medicare Other | Admitting: Family Medicine

## 2021-01-07 ENCOUNTER — Encounter: Payer: Self-pay | Admitting: Family Medicine

## 2021-01-07 VITALS — BP 155/78 | HR 64 | Ht 60.0 in | Wt 93.0 lb

## 2021-01-07 DIAGNOSIS — G301 Alzheimer's disease with late onset: Secondary | ICD-10-CM | POA: Diagnosis not present

## 2021-01-07 DIAGNOSIS — F419 Anxiety disorder, unspecified: Secondary | ICD-10-CM

## 2021-01-07 DIAGNOSIS — F32A Depression, unspecified: Secondary | ICD-10-CM

## 2021-01-07 DIAGNOSIS — Z1231 Encounter for screening mammogram for malignant neoplasm of breast: Secondary | ICD-10-CM | POA: Insufficient documentation

## 2021-01-07 DIAGNOSIS — F0280 Dementia in other diseases classified elsewhere without behavioral disturbance: Secondary | ICD-10-CM | POA: Diagnosis not present

## 2021-01-07 MED ORDER — MEMANTINE HCL 10 MG PO TABS: 10.0000 mg | ORAL_TABLET | Freq: Every day | ORAL | 1 refills | Status: DC

## 2021-02-12 ENCOUNTER — Encounter: Payer: Self-pay | Admitting: Family Medicine

## 2021-02-12 ENCOUNTER — Other Ambulatory Visit: Payer: Self-pay | Admitting: Emergency Medicine

## 2021-02-12 MED ORDER — BUSPIRONE HCL 5 MG PO TABS: 5.0000 mg | ORAL_TABLET | Freq: Two times a day (BID) | ORAL | 11 refills | Status: DC

## 2021-05-16 DIAGNOSIS — Z0001 Encounter for general adult medical examination with abnormal findings: Secondary | ICD-10-CM | POA: Diagnosis not present

## 2021-05-16 DIAGNOSIS — F329 Major depressive disorder, single episode, unspecified: Secondary | ICD-10-CM | POA: Diagnosis not present

## 2021-05-16 DIAGNOSIS — D509 Iron deficiency anemia, unspecified: Secondary | ICD-10-CM | POA: Diagnosis not present

## 2021-05-16 DIAGNOSIS — F17290 Nicotine dependence, other tobacco product, uncomplicated: Secondary | ICD-10-CM | POA: Diagnosis not present

## 2021-07-16 ENCOUNTER — Encounter: Payer: Self-pay | Admitting: Family Medicine

## 2021-07-16 ENCOUNTER — Other Ambulatory Visit: Payer: Self-pay

## 2021-07-16 ENCOUNTER — Ambulatory Visit: Payer: Medicare Other | Admitting: Family Medicine

## 2021-07-16 VITALS — BP 176/73 | HR 62 | Ht 60.0 in | Wt 95.0 lb

## 2021-07-16 DIAGNOSIS — F419 Anxiety disorder, unspecified: Secondary | ICD-10-CM | POA: Diagnosis not present

## 2021-07-16 DIAGNOSIS — G301 Alzheimer's disease with late onset: Secondary | ICD-10-CM

## 2021-07-16 DIAGNOSIS — F0280 Dementia in other diseases classified elsewhere without behavioral disturbance: Secondary | ICD-10-CM | POA: Diagnosis not present

## 2021-07-16 DIAGNOSIS — F32A Depression, unspecified: Secondary | ICD-10-CM | POA: Diagnosis not present

## 2021-07-16 MED ORDER — MEMANTINE HCL 10 MG PO TABS: 10.0000 mg | ORAL_TABLET | Freq: Two times a day (BID) | ORAL | 1 refills | Status: DC

## 2021-07-16 NOTE — Progress Notes (Signed)
PATIENT: Sophia Coleman DOB: [birth year]  REASON FOR VISIT: follow up HISTORY FROM: patient  Chief Complaint  Patient presents with   Follow-up    RM 26 with daughter.     HISTORY OF PRESENT ILLNESS: 07/16/21 ALL:  Sophia Coleman returns for follow up for AD. We continued buspirone 5mg  BID and increased memantine to 10mg  daily. She seems to have tolerated medication adjustments. No significant improvement in memory. She continues to have anxiety. She will call daughter in law most mornings telling her she is sick and needs the daughter in law to come over. She has stopped cooking as much. Sophia Coleman feels that she may be forgetting to eat. She has a good appetite when Sophia Coleman is with her. Eating more sweets. She has gained 3 pounds since last year. She was started on statin with PCP. Able to perform ADLs but will ask for help if someone is close. She is sleeping well. She naps more during the day. She is no longer driving. No falls. She reports that BP is usually ok at home. She forgot to take her BP meds yesterday.   01/07/2021 ALL:  Sophia Coleman returns for follow up for AD. She presents with her daughter, Sophia Coleman, who aids in history. We continued Buspar twice daily and added memantine 5mg  BID at last visit 07/2020. Sophia Coleman reports that she has taken memantin 5mg  daily and is tolerating well. Sophia Coleman organizes her medicine container weekly and is concerned that Sophia Coleman will not be able to remember taking medicaitons twice daily. She leaves buspirone on window seal and usually just takes it at night. Memory seems stable. She rarely drives. She is eating normally. Able to perform all ADLs. No falls.   07/24/2020 ALL:  She is doing fairly well, today. No significant worsening. Her daughter does feel that short term memory continues to progress. She has had more concerns of anxiety attacks over the past month. Her daughter reports that she is calling her or her sister in law every morning stating that something is wrong.  And that she needs help right away. By the time someone gets to her, she doesn't remember calling. It has been harder for her to shop independently. Her daughter is helping with grocery shopping. She rarely drives. She is seeing PCP annually. She continues Paxil 40mg  daily. She has had nightmares with Aricept. She reports trying Namenda in the past but her daughter does not think she ever started it.   01/25/2020 ALL:  Sophia Coleman is a 81 y.o. female here today for follow up for memory loss.  We started her on a trial of BuSpar 5 mg at bedtime in March.  She does feel like this medication has helped significantly with her anxiety.  Her daughter presents with her today and is in agreement.  She feels that she is sleeping better.  She continues to be active.  She is driving without difficulty.  Driving is only in short, local, familiar areas.  Her daughter reports that she does have more irritability when performing difficult task.  She continues to assist her daughter with finances.  Her daughter reports that she will often become irritable and refused to help.  Short-term memory continues to be the biggest concern.  Otherwise, she seems to be doing very well.  HISTORY: (copied from my note on 07/25/2019)  Sophia Coleman is a 81 y.o. female here today for follow up for memory loss. She tried Namenda but did not like how she felt on  it and stopped. She feels that it caused nightmares. She also had nightmares on Aricept. Her daughter is with her today and aids in history. She is doing fairly well. She continues to live alone. She is able to perform all ADL's independently. She is eating well. She continues to note anxiety and depression. She continues Paxil daily prescribed by PCP. Her daughter reports that she is having more panic symptoms recently. She gets worked up very quickly. She has family that live across the street and can come when she needs help. They have had to call EMS recently due to panic  attacks. She has taken Xanax in the past for anxiety that does help but patient and daughter are hesitant to continue medication.   HISTORY: (copied from my note on 01/18/2019)   Sophia Coleman is a 81 y.o. female here today for follow up for concerns of memory loss. She has tried Aricept in the past with concerns unwanted dreams. She was advised to consider Namenda at last visit.  She feels that this may be a reasonable medication to consider.  Admits to suffering from depression.  She has been on Paxil 40 mg for many years.  She has attempted to switch to a different SSRI in the past but feels that she was unable to tolerate increased anxiety.  She lives at home alone.  She is able to perform all ADLs.  She is able to dose her own medications.  She does have family members that check on her regularly and live close by.  She continues to drive locally and short distances.  There have been no concerns of accidents or getting lost.  No falls at home.     HISTORY: (copied from Dr Gladstone Lighter note on 09/16/2018)   81 year old female here for evaluation of memory loss and confusion.   Patient has had 6 to 12 months of mild short-term memory loss, forgetting recent conversations and events, names of grandchildren, without change in ADLs.   In April 2020 she had a more severe change in symptoms.  She had anxiety, shortness of breath, lightheadedness and confusion.  Patient went to the hospital on 09/01/2018 for evaluation.  Lab testing were unremarkable.  She was discharged home.   09/03/2018 patient continued to have symptoms and was very confused and amnestic to her first emergency room visit.  Therefore she went back to the emergency room and had CT scan of the head which was unremarkable.   Since that time patient is doing little bit better.   Patient typically lives alone.  Currently her family is with her.  She normally is able to take care of all of her ADLs.   Her PCP started her on Aricept 5 mg,  but she took this dose and had severe crazy dreams, and she stopped the medication.   Patient has history of emphysema, hypertension, heart disease, carotid artery disease status post endarterectomy, ongoing tobacco abuse.   REVIEW OF SYSTEMS: Out of a complete 14 system review of symptoms, the patient complains only of the following symptoms, memory loss, anxiety and all other reviewed systems are negative.   ALLERGIES: Allergies  Allergen Reactions   Vicodin [Hydrocodone-Acetaminophen] Nausea And Vomiting   Augmentin [Amoxicillin-Pot Clavulanate] Other (See Comments)    "SEVERE SWEATS" Has patient had a PCN reaction causing immediate rash, facial/tongue/throat swelling, SOB or lightheadedness with hypotension: No Has patient had a PCN reaction causing severe rash involving mucus membranes or skin necrosis: No Has patient had a  PCN reaction that required hospitalization: No Has patient had a PCN reaction occurring within the last 10 years: Yes If all of the above answers are "NO", then may proceed with Cephalosporin use.    Quinolones Nausea And Vomiting    HOME MEDICATIONS: Outpatient Medications Prior to Visit  Medication Sig Dispense Refill   atorvastatin (LIPITOR) 10 MG tablet Take 10 mg by mouth daily.     busPIRone (BUSPAR) 5 MG tablet Take 1 tablet (5 mg total) by mouth 2 (two) times daily. 60 tablet 11   ferrous sulfate 325 (65 FE) MG tablet Take 1 tablet (325 mg total) by mouth daily. 30 tablet 0   losartan (COZAAR) 100 MG tablet Take 100 mg by mouth every morning.      PARoxetine (PAXIL) 40 MG tablet Take 40 mg by mouth every morning.     memantine (NAMENDA) 10 MG tablet Take 1 tablet (10 mg total) by mouth daily. 90 tablet 1   No facility-administered medications prior to visit.    PAST MEDICAL HISTORY: Past Medical History:  Diagnosis Date   Anemia    Anxiety    Arthritis    Carotid artery occlusion    Chronic kidney disease    Colon polyps    COPD (chronic  obstructive pulmonary disease) (HCC)    Depression    Dyspnea    Heart murmur    Hypertension    Memory difficulty    Pneumonia    PONV (postoperative nausea and vomiting)     PAST SURGICAL HISTORY: Past Surgical History:  Procedure Laterality Date   CAROTID ENDARTERECTOMY     CATARACT EXTRACTION W/PHACO Left 01/02/2014   Procedure: CATARACT EXTRACTION PHACO AND INTRAOCULAR LENS PLACEMENT ([place]);  Surgeon: Tonny Branch, MD;  Location: [facility];  Service: Ophthalmology;  Laterality: Left;  CDE 8.17   CATARACT EXTRACTION W/PHACO Right 03/06/2014   Procedure: CATARACT EXTRACTION PHACO AND INTRAOCULAR LENS PLACEMENT ([facility]);  Surgeon: Tonny Branch, MD;  Location: [facility];  Service: Ophthalmology;  Laterality: Right;  CDE:15.14   CHOLECYSTECTOMY     COLONOSCOPY  11/13/2011   Procedure: COLONOSCOPY;  Surgeon: Rogene Houston, MD;  Location: [facility];  Service: Endoscopy;  Laterality: N/A;  1030   COLONOSCOPY N/A 01/22/2017   Procedure: COLONOSCOPY;  Surgeon: Rogene Houston, MD;  Location: [facility];  Service: Endoscopy;  Laterality: N/A;  1030   ENDARTERECTOMY Left 04/02/2016   Procedure: ENDARTERECTOMY LEFT CAROTID ARTERY;  Surgeon: Rosetta Posner, MD;  Location: [facility];  Service: Vascular;  Laterality: Left;   PATCH ANGIOPLASTY Left 04/02/2016   Procedure: PATCH ANGIOPLASTY LEFT CAROTID ARTERY USING HEMASHIELD PLATINUM FINESS PATCH;  Surgeon: Rosetta Posner, MD;  Location: [facility];  Service: Vascular;  Laterality: Left;    FAMILY HISTORY: Family History  Problem Relation Age of Onset   Heart murmur Mother    Heart Problems Mother    Heart disease Mother    Hypertension Mother    Glaucoma Mother    Emphysema Father        COPD   Cancer Sister    Lung cancer Sister    Dementia Brother    Stroke Maternal Grandmother    Heart attack Maternal Grandfather    Kidney disease Paternal Grandfather    Colon cancer Neg Hx     SOCIAL HISTORY: Social History   Socioeconomic History   Marital  status: Widowed    Spouse name: Not on file   Number of children: 2  Years of education: Not on file   Highest education level: Not on file  Occupational History    Comment: retired  Tobacco Use   Smoking status: Every Day    Packs/day: 1.00    Years: 50.00    Pack years: 50.00    Types: Cigarettes   Smokeless tobacco: Never  Vaping Use   Vaping Use: Never used  Substance and Sexual Activity   Alcohol use: No   Drug use: No   Sexual activity: Not on file  Other Topics Concern   Not on file  Social History Narrative   Lives alone   Social Determinants of Health   Financial Resource Strain: Not on file  Food Insecurity: Not on file  Transportation Needs: Not on file  Physical Activity: Not on file  Stress: Not on file  Social Connections: Not on file  Intimate Partner Violence: Not on file      PHYSICAL EXAM  Vitals:   07/16/21 1057  BP: (!) 176/73  Pulse: 62  Weight: 95 lb (43.1 kg)  Height: 5' (1.524 m)     Body mass index is 18.55 kg/m.  Generalized: Well developed, in no acute distress  Cardiology: normal rate and rhythm, no murmur noted Respiratory: clear to auscultation bilaterally  Neurological examination  Mentation: Alert oriented to time, place, history taking. Follows all commands speech and language fluent Cranial nerve II-XII: Pupils were equal round reactive to light. Extraocular movements were full, visual field were full on confrontational test. Facial sensation and strength were normal. Head turning and shoulder shrug  were normal and symmetric. Motor: The motor testing reveals 5 over 5 strength of all 4 extremities. Good symmetric motor tone is noted throughout.  Gait and station: Gait is normal.    DIAGNOSTIC DATA (LABS, IMAGING, TESTING) - I reviewed patient records, labs, notes, testing and imaging myself where available.  MMSE - Mini Mental State Exam 07/16/2021 01/07/2021 07/24/2020  Not completed: - - -  Orientation to time 1 2 1    Orientation to Place 4 4 4   Registration 3 3 3   Attention/ Calculation 0 3 2  Recall 0 0 2  Language- name 2 objects 2 2 1   Language- repeat 1 1 1   Language- follow 3 step command 2 3 3   Language- read & follow direction 1 1 1   Write a sentence 1 1 1   Copy design 1 0 0  Total score 16 20 19      Lab Results  Component Value Date   WBC 11.5 (H) 07/05/2019   HGB 10.2 (L) 07/05/2019   HCT 33.8 (L) 07/05/2019   MCV 89.9 07/05/2019   PLT 663 (H) 07/05/2019      Component Value Date/Time   NA 135 07/05/2019 1919   K 4.8 07/05/2019 1919   CL 103 07/05/2019 1919   CO2 22 07/05/2019 1919   GLUCOSE 97 07/05/2019 1919   BUN 20 07/05/2019 1919   CREATININE 0.92 07/05/2019 1919   CREATININE 0.89 06/23/2019 1308   CALCIUM 10.0 07/05/2019 1919   PROT 7.5 07/05/2019 1919   ALBUMIN 3.9 07/05/2019 1919   AST 27 07/05/2019 1919   ALT 24 07/05/2019 1919   ALKPHOS 79 07/05/2019 1919   BILITOT 0.6 07/05/2019 1919   GFRNONAA >60 07/05/2019 1919   GFRNONAA 63 06/23/2019 1308   GFRAA >60 07/05/2019 1919   GFRAA 72 06/23/2019 1308   No results found for: CHOL, HDL, LDLCALC, LDLDIRECT, TRIG, CHOLHDL No results found for: HGBA1C Lab Results  Component Value Date   [id number] 06/16/2017   No results found for: TSH     ASSESSMENT AND PLAN 81 y.o. year old female  has a past medical history of Anemia, Anxiety, Arthritis, Carotid artery occlusion, Chronic kidney disease, Colon polyps, COPD (chronic obstructive pulmonary disease) ([place]), Depression, Dyspnea, Heart murmur, Hypertension, Memory difficulty, Pneumonia, and PONV (postoperative nausea and vomiting). here with     ICD-10-CM   1. Late onset Alzheimer's dementia without behavioral disturbance (HCC)  G30.1    F02.80     2. Anxiety and depression  F41.9    F32.A         Sophia Coleman is doing fairly well, today. Memory continues to decline. MMSE 16/30, previously 20/30. Anxiety seems better on buspirone. We will continue 5mg  up  to twice daily. May increase in the future if needed. We will increase memantine to 10 twice daily. Her daughter will help with medication administration and monitor for side effects. She will stay well-hydrated and eat well-balanced meals. Advised not to drive. She will continue follow-up with primary care as directed.  She will follow-up with me in 6 months, sooner if needed.  She verbalizes understanding and agreement with this plan.   No orders of the defined types were placed in this encounter.     Meds ordered this encounter  Medications   memantine (NAMENDA) 10 MG tablet    Sig: Take 1 tablet (10 mg total) by mouth 2 (two) times daily.    Dispense:  180 tablet    Refill:  1    Order Specific Question:   Supervising Provider    Answer:   Bess Harvest, FNP-C 07/16/2021, 2:37 PM [facility] [address], [place], [place] [address] [phone]

## 2021-07-16 NOTE — Patient Instructions (Addendum)
Below is our plan:  We will increase Namenda (memantine) to 10mg  twice daily. Continue 10mg  at 11am and add 5mg  at bedtime for 1 week. Continue Buspar (buspirone). You can take 5mg  twice daily. You can give an extra dose if needed or double dose to 10 if she is really anxious. Call me if you need refills. Keep an eye on your blood pressure.   Please make sure you are staying well hydrated. I recommend 50-60 ounces daily. Well balanced diet and regular exercise encouraged. Consistent sleep schedule with 6-8 hours recommended.   Please continue follow up with care team as directed.   Follow up with me in 6 months   You may receive a survey regarding today's visit. I encourage you to leave honest feed back as I do use this information to improve patient care. Thank you for seeing me today!   Management of Memory Problems   There are some general things you can do to help manage your memory problems.  Your memory may not in fact recover, but by using techniques and strategies you will be able to manage your memory difficulties better.   1)  Establish a routine. Try to establish and then stick to a regular routine.  By doing this, you will get used to what to expect and you will reduce the need to rely on your memory.  Also, try to do things at the same time of day, such as taking your medication or checking your calendar first thing in the morning. Think about think that you can do as a part of a regular routine and make a list.  Then enter them into a daily planner to remind you.  This will help you establish a routine.   2)  Organize your environment. Organize your environment so that it is uncluttered.  Decrease visual stimulation.  Place everyday items such as keys or cell phone in the same place every day (ie.  Basket next to front door) Use post it notes with a brief message to yourself (ie. Turn off light, lock the door) Use labels to indicate where things go (ie. Which cupboards are for  food, dishes, etc.) Keep a notepad and pen by the telephone to take messages   3)  Memory Aids A diary or journal/notebook/daily planner Making a list (shopping list, chore list, to do list that needs to be done) Using an alarm as a reminder (kitchen timer or cell phone alarm) Using cell phone to store information (Notes, Calendar, Reminders) Calendar/White board placed in a prominent position Post-it notes   In order for memory aids to be useful, you need to have good habits.  It's no good remembering to make a note in your journal if you don't remember to look in it.  Try setting aside a certain time of day to look in journal.   4)  Improving mood and managing fatigue. There may be other factors that contribute to memory difficulties.  Factors, such as anxiety, depression and tiredness can affect memory. Regular gentle exercise can help improve your mood and give you more energy. Simple relaxation techniques may help relieve symptoms of anxiety Try to get back to completing activities or hobbies you enjoyed doing in the past. Learn to pace yourself through activities to decrease fatigue. Find out about some local support groups where you can share experiences with others. Try and achieve 7-8 hours of sleep at night.

## 2021-12-09 ENCOUNTER — Other Ambulatory Visit: Payer: Self-pay

## 2021-12-09 ENCOUNTER — Encounter (HOSPITAL_COMMUNITY): Payer: Self-pay | Admitting: *Deleted

## 2021-12-09 ENCOUNTER — Emergency Department (HOSPITAL_COMMUNITY): Payer: Medicare Other

## 2021-12-09 ENCOUNTER — Emergency Department (HOSPITAL_COMMUNITY)
Admission: EM | Admit: 2021-12-09 | Discharge: 2021-12-09 | Discharge status: Against Medical Advice | Payer: Medicare Other | Attending: Emergency Medicine | Admitting: Emergency Medicine

## 2021-12-09 DIAGNOSIS — J439 Emphysema, unspecified: Secondary | ICD-10-CM | POA: Diagnosis not present

## 2021-12-09 DIAGNOSIS — R059 Cough, unspecified: Secondary | ICD-10-CM | POA: Insufficient documentation

## 2021-12-09 DIAGNOSIS — F17200 Nicotine dependence, unspecified, uncomplicated: Secondary | ICD-10-CM | POA: Diagnosis not present

## 2021-12-09 DIAGNOSIS — N189 Chronic kidney disease, unspecified: Secondary | ICD-10-CM | POA: Diagnosis not present

## 2021-12-09 DIAGNOSIS — I129 Hypertensive chronic kidney disease with stage 1 through stage 4 chronic kidney disease, or unspecified chronic kidney disease: Secondary | ICD-10-CM | POA: Diagnosis not present

## 2021-12-09 DIAGNOSIS — J449 Chronic obstructive pulmonary disease, unspecified: Secondary | ICD-10-CM | POA: Diagnosis not present

## 2021-12-09 DIAGNOSIS — R079 Chest pain, unspecified: Secondary | ICD-10-CM | POA: Diagnosis not present

## 2021-12-09 DIAGNOSIS — I10 Essential (primary) hypertension: Secondary | ICD-10-CM

## 2021-12-09 DIAGNOSIS — F0390 Unspecified dementia without behavioral disturbance: Secondary | ICD-10-CM | POA: Diagnosis not present

## 2021-12-09 DIAGNOSIS — R072 Precordial pain: Secondary | ICD-10-CM

## 2021-12-09 LAB — CBC
HCT: 38.6 % (ref 36.0–46.0)
Hemoglobin: 12.7 g/dL (ref 12.0–15.0)
MCH: 30.8 pg (ref 26.0–34.0)
MCHC: 32.9 g/dL (ref 30.0–36.0)
MCV: 93.7 fL (ref 80.0–100.0)
Platelets: 375 10*3/uL (ref 150–400)
RBC: 4.12 MIL/uL (ref 3.87–5.11)
RDW: 13.2 % (ref 11.5–15.5)
WBC: 9.2 10*3/uL (ref 4.0–10.5)
nRBC: 0 % (ref 0.0–0.2)

## 2021-12-09 LAB — BASIC METABOLIC PANEL
Anion gap: 7 (ref 5–15)
BUN: 23 mg/dL (ref 8–23)
CO2: 25 mmol/L (ref 22–32)
Calcium: 10.5 mg/dL — ABNORMAL HIGH (ref 8.9–10.3)
Chloride: 107 mmol/L (ref 98–111)
Creatinine, Ser: 0.93 mg/dL (ref 0.44–1.00)
GFR, Estimated: >60 mL/min (ref >60)
Glucose, Bld: 100 mg/dL — ABNORMAL HIGH (ref 70–99)
Potassium: 4.3 mmol/L (ref 3.5–5.1)
Sodium: 139 mmol/L (ref 135–145)

## 2021-12-09 LAB — TROPONIN I (HIGH SENSITIVITY): Troponin I (High Sensitivity): 3 ng/L (ref <18)

## 2021-12-09 NOTE — ED Triage Notes (Signed)
Pt called EMS for CP and seen at the house, family member ended up bringing pt in today. Denies CP at present, noted a cough in triage, was told by EMS her BP was elevated.

## 2021-12-09 NOTE — ED Notes (Signed)
I entered pt room due to alarms sounding. Pt removing all medical equipment, adamantly insisting to leave AMA. Discussed with pt's healthcare giver at bedside and pt of the risks of doing so. Pt to sign AMA form.

## 2021-12-09 NOTE — ED Notes (Signed)
Dr. Zackowski at bedside  

## 2021-12-09 NOTE — ED Provider Notes (Signed)
[facility] EMERGENCY DEPARTMENT Provider Note   CSN: [id number] Arrival date & time: 12/09/21  1358     History  Chief Complaint  Patient presents with   Chest Pain    Sophia Coleman is a 81 y.o. female.  Patient brought in by POV but EMS was at the house and recommended the patient be seen she refused transportation.  Patient with onset of chest pain earlier today.  Patient does have a history of dementia.  Also with cough.  Patient refused to come in by EMS so caregiver brought her in.  Medical history significant for hypertension COPD chronic kidney disease carotid artery occlusion memory difficulty.  Patient is an everyday smoker.       Home Medications Prior to Admission medications   Medication Sig Start Date End Date Taking? Authorizing Provider  atorvastatin (LIPITOR) 10 MG tablet Take 10 mg by mouth daily. 06/11/21   [provider]  busPIRone (BUSPAR) 5 MG tablet Take 1 tablet (5 mg total) by mouth 2 (two) times daily. 02/12/21   Lomax, Amy, NP  ferrous sulfate 325 (65 FE) MG tablet Take 1 tablet (325 mg total) by mouth daily. 06/13/19   Sherwood Gambler, MD  losartan (COZAAR) 100 MG tablet Take 100 mg by mouth every morning.     [provider]  memantine (NAMENDA) 10 MG tablet Take 1 tablet (10 mg total) by mouth 2 (two) times daily. 07/16/21   Lomax, Amy, NP  PARoxetine (PAXIL) 40 MG tablet Take 40 mg by mouth every morning.    [provider]      Allergies    Vicodin [hydrocodone-acetaminophen], Augmentin [amoxicillin-pot clavulanate], and Quinolones    Review of Systems   Review of Systems  Constitutional:  Negative for chills and fever.  HENT:  Negative for ear pain and sore throat.   Eyes:  Negative for pain and visual disturbance.  Respiratory:  Negative for cough and shortness of breath.   Cardiovascular:  Positive for chest pain. Negative for palpitations.  Gastrointestinal:  Negative for abdominal pain and vomiting.   Genitourinary:  Negative for dysuria and hematuria.  Musculoskeletal:  Negative for arthralgias and back pain.  Skin:  Negative for color change and rash.  Neurological:  Negative for seizures and syncope.  All other systems reviewed and are negative.   Physical Exam Updated Vital Signs BP 140/75   Pulse 63   Temp 98 F (36.7 C) (Oral)   Resp (!) 22   Ht 1.524 m (5')   Wt 45.4 kg   SpO2 96%   BMI 19.53 kg/m  Physical Exam Vitals and nursing note reviewed.  Constitutional:      General: She is not in acute distress.    Appearance: She is well-developed. She is not ill-appearing or diaphoretic.  HENT:     Head: Normocephalic and atraumatic.  Eyes:     Conjunctiva/sclera: Conjunctivae normal.  Cardiovascular:     Rate and Rhythm: Normal rate and regular rhythm.     Heart sounds: No murmur heard. Pulmonary:     Effort: Pulmonary effort is normal. No respiratory distress.     Breath sounds: Normal breath sounds. No decreased breath sounds, wheezing, rhonchi or rales.  Abdominal:     Palpations: Abdomen is soft.     Tenderness: There is no abdominal tenderness.  Musculoskeletal:        General: No swelling.     Cervical back: Neck supple.     Right lower leg: No  edema.     Left lower leg: No edema.  Skin:    General: Skin is warm and dry.     Capillary Refill: Capillary refill takes less than 2 seconds.  Neurological:     Mental Status: She is alert.  Psychiatric:        Mood and Affect: Mood normal.     ED Results / Procedures / Treatments   Labs (all labs ordered are listed, but only abnormal results are displayed) Labs Reviewed  BASIC METABOLIC PANEL - Abnormal; Notable for the following components:      Result Value   Glucose, Bld 100 (*)    Calcium 10.5 (*)    All other components within normal limits  CBC  TROPONIN I (HIGH SENSITIVITY)  TROPONIN I (HIGH SENSITIVITY)    EKG EKG Interpretation  Date/Time:  Monday December 09 2021 14:17:55  EDT Ventricular Rate:  59 PR Interval:    QRS Duration: 88 QT Interval:  412 QTC Calculation: 407 R Axis:   88 Text Interpretation: Junctional rhythm Septal infarct (cited on or before 13-Jun-2019) ST & T wave abnormality, consider inferior ischemia Abnormal ECG When compared with ECG of 05-Jul-2019 18:13, Junctional rhythm has replaced Sinus rhythm Questionable change in initial forces of Anterior leads ST now depressed in Inferior leads Non-specific change in ST segment in Lateral leads T wave inversion now evident in Inferior leads Confirmed by Fredia Sorrow [phone]) on 12/09/2021 3:51:44 PM  Radiology DG Chest 2 View  Result Date: 12/09/2021 CLINICAL DATA:  Chest pain EXAM: CHEST - 2 VIEW COMPARISON:  2021 FINDINGS: The heart size and mediastinal contours are within normal limits. Hyperinflation. Both lungs are clear apart from chronic interstitial changes superimposed on emphysema. No pleural effusion or pneumothorax. No acute osseous abnormality. IMPRESSION: No acute process in the chest. Electronically Signed   By: Macy Mis M.D.   On: 12/09/2021 14:44    Procedures Procedures    Medications Ordered in ED Medications - No data to display  ED Course/ Medical Decision Making/ A&P                           Medical Decision Making Amount and/or Complexity of Data Reviewed Labs: ordered. Radiology: ordered.   Patient's initial troponin was 3.  Will need delta troponin.  Basic metabolic panel no significant electrolyte abnormalities blood sugar was 100 renal function normal with GFR greater than 60 CBC normal no leukocytosis no significant anemia hemoglobin 12.7 chest x-ray no acute process in the chest.  Patient is EKG without any acute abnormalities.  But there is evidence of ST and T wave changes compared to old.  Patient decided she wants to leave AMA.  Caregiver is here.  Patient adamant she wants to leave.  She is aware that it could result in death we have not done a  complete work-up and she may require admission for the chest pain.  Patient currently of sound mind and insisted on wanting to go caregiver have spoken with her daughter and her daughter says if she wants to go she can go.  Patient knows she can come back at any time.   Final Clinical Impression(s) / ED Diagnoses Final diagnoses:  Dementia, unspecified dementia severity, unspecified dementia type, unspecified whether behavioral, psychotic, or mood disturbance or anxiety (HCC)  Precordial pain  Primary hypertension    Rx / DC Orders ED Discharge Orders     None  Fredia Sorrow, MD 12/09/21 1650

## 2021-12-13 ENCOUNTER — Encounter (INDEPENDENT_AMBULATORY_CARE_PROVIDER_SITE_OTHER): Payer: Self-pay | Admitting: *Deleted

## 2021-12-15 ENCOUNTER — Other Ambulatory Visit: Payer: Self-pay

## 2021-12-15 ENCOUNTER — Emergency Department (HOSPITAL_COMMUNITY): Payer: Medicare Other

## 2021-12-15 ENCOUNTER — Emergency Department (HOSPITAL_COMMUNITY)
Admission: EM | Admit: 2021-12-15 | Discharge: 2021-12-15 | Disposition: A | Discharge status: Home / Self Care | Payer: Medicare Other | Attending: Emergency Medicine | Admitting: Emergency Medicine

## 2021-12-15 ENCOUNTER — Encounter (HOSPITAL_COMMUNITY): Payer: Self-pay | Admitting: Emergency Medicine

## 2021-12-15 DIAGNOSIS — I10 Essential (primary) hypertension: Secondary | ICD-10-CM

## 2021-12-15 DIAGNOSIS — D649 Anemia, unspecified: Secondary | ICD-10-CM | POA: Diagnosis not present

## 2021-12-15 DIAGNOSIS — D72829 Elevated white blood cell count, unspecified: Secondary | ICD-10-CM | POA: Diagnosis not present

## 2021-12-15 DIAGNOSIS — R5381 Other malaise: Secondary | ICD-10-CM | POA: Diagnosis not present

## 2021-12-15 DIAGNOSIS — J189 Pneumonia, unspecified organism: Secondary | ICD-10-CM | POA: Diagnosis not present

## 2021-12-15 DIAGNOSIS — J449 Chronic obstructive pulmonary disease, unspecified: Secondary | ICD-10-CM | POA: Insufficient documentation

## 2021-12-15 DIAGNOSIS — R030 Elevated blood-pressure reading, without diagnosis of hypertension: Secondary | ICD-10-CM | POA: Diagnosis not present

## 2021-12-15 DIAGNOSIS — F0390 Unspecified dementia without behavioral disturbance: Secondary | ICD-10-CM | POA: Diagnosis not present

## 2021-12-15 DIAGNOSIS — D500 Iron deficiency anemia secondary to blood loss (chronic): Secondary | ICD-10-CM | POA: Insufficient documentation

## 2021-12-15 HISTORY — DX: Unspecified dementia, unspecified severity, without behavioral disturbance, psychotic disturbance, mood disturbance, and anxiety: F03.90

## 2021-12-15 LAB — URINALYSIS, ROUTINE W REFLEX MICROSCOPIC
Bacteria, UA: NONE SEEN
Bilirubin Urine: NEGATIVE
Glucose, UA: NEGATIVE mg/dL
Ketones, ur: NEGATIVE mg/dL
Leukocytes,Ua: NEGATIVE
Nitrite: NEGATIVE
Protein, ur: NEGATIVE mg/dL
Specific Gravity, Urine: 1.01 (ref 1.005–1.030)
pH: 7 (ref 5.0–8.0)

## 2021-12-15 LAB — COMPREHENSIVE METABOLIC PANEL
ALT: 13 U/L (ref 0–44)
AST: 19 U/L (ref 15–41)
Albumin: 3.6 g/dL (ref 3.5–5.0)
Alkaline Phosphatase: 64 U/L (ref 38–126)
Anion gap: 5 (ref 5–15)
BUN: 20 mg/dL (ref 8–23)
CO2: 24 mmol/L (ref 22–32)
Calcium: 9.7 mg/dL (ref 8.9–10.3)
Chloride: 106 mmol/L (ref 98–111)
Creatinine, Ser: 0.96 mg/dL (ref 0.44–1.00)
GFR, Estimated: >60 mL/min (ref >60)
Glucose, Bld: 124 mg/dL — ABNORMAL HIGH (ref 70–99)
Potassium: 4 mmol/L (ref 3.5–5.1)
Sodium: 135 mmol/L (ref 135–145)
Total Bilirubin: 0.4 mg/dL (ref 0.3–1.2)
Total Protein: 6.8 g/dL (ref 6.5–8.1)

## 2021-12-15 LAB — CBC WITH DIFFERENTIAL/PLATELET
Abs Immature Granulocytes: 0.04 10*3/uL (ref 0.00–0.07)
Basophils Absolute: 0.1 10*3/uL (ref 0.0–0.1)
Basophils Relative: 0 %
Eosinophils Absolute: 0 10*3/uL (ref 0.0–0.5)
Eosinophils Relative: 0 %
HCT: 33.3 % — ABNORMAL LOW (ref 36.0–46.0)
Hemoglobin: 11.2 g/dL — ABNORMAL LOW (ref 12.0–15.0)
Immature Granulocytes: 0 %
Lymphocytes Relative: 3 %
Lymphs Abs: 0.6 10*3/uL — ABNORMAL LOW (ref 0.7–4.0)
MCH: 31.1 pg (ref 26.0–34.0)
MCHC: 33.6 g/dL (ref 30.0–36.0)
MCV: 92.5 fL (ref 80.0–100.0)
Monocytes Absolute: 1.1 10*3/uL — ABNORMAL HIGH (ref 0.1–1.0)
Monocytes Relative: 6 %
Neutro Abs: 15.7 10*3/uL — ABNORMAL HIGH (ref 1.7–7.7)
Neutrophils Relative %: 91 %
Platelets: 315 10*3/uL (ref 150–400)
RBC: 3.6 MIL/uL — ABNORMAL LOW (ref 3.87–5.11)
RDW: 13.2 % (ref 11.5–15.5)
WBC: 17.5 10*3/uL — ABNORMAL HIGH (ref 4.0–10.5)
nRBC: 0 % (ref 0.0–0.2)

## 2021-12-15 MED ORDER — SODIUM CHLORIDE 0.9 % IV SOLN
2.0000 g | Freq: Once | INTRAVENOUS | Status: AC
  Administered 2021-12-15: 2 g via INTRAVENOUS
  Filled 2021-12-15: qty 20

## 2021-12-15 MED ORDER — CEFPODOXIME PROXETIL 200 MG PO TABS: 200.0000 mg | ORAL_TABLET | Freq: Two times a day (BID) | ORAL | 0 refills | Status: AC

## 2021-12-15 MED ORDER — AZITHROMYCIN 500 MG IV SOLR
500.0000 mg | Freq: Once | INTRAVENOUS | Status: AC
  Administered 2021-12-15: 500 mg via INTRAVENOUS
  Filled 2021-12-15: qty 5

## 2021-12-15 MED ORDER — AZITHROMYCIN 250 MG PO TABS: 250.0000 mg | ORAL_TABLET | Freq: Every day | ORAL | 0 refills | Status: DC

## 2021-12-15 NOTE — ED Triage Notes (Signed)
Pt's son received call from pt stating she "didn't feel well". Pt has no complaints. Per son, pt has had HTN for "about a month". Pt with dementia.

## 2021-12-15 NOTE — ED Provider Notes (Signed)
[facility] EMERGENCY DEPARTMENT Provider Note   CSN: [id number] Arrival date & time: 12/15/21  9563     History  Chief Complaint  Patient presents with   Hypertension    Sophia Coleman is a 81 y.o. female.  The history is provided by a relative and the patient. The history is limited by the condition of the patient (Dementia).  Hypertension  She has history of hypertension, COPD, dementia and is brought in by her son because of patient stating that she did not feel well at home.  She apparently woke up and told that she was hurting and needed to come to the hospital.  She currently has no complaints.  She denies pain anywhere.  She denies shortness of breath or cough.  She denies nausea, vomiting, diarrhea.  She denies any urinary urgency, frequency, tenesmus, dysuria.  Son does state that she had a similar episode about a week ago at which time her blood pressure was noted to be quite high.  There have been no known fever, chills, sweats.   Home Medications Prior to Admission medications   Medication Sig Start Date End Date Taking? Authorizing Provider  atorvastatin (LIPITOR) 10 MG tablet Take 10 mg by mouth daily. 06/11/21   [provider]  busPIRone (BUSPAR) 5 MG tablet Take 1 tablet (5 mg total) by mouth 2 (two) times daily. 02/12/21   Lomax, Amy, NP  ferrous sulfate 325 (65 FE) MG tablet Take 1 tablet (325 mg total) by mouth daily. 06/13/19   Sherwood Gambler, MD  losartan (COZAAR) 100 MG tablet Take 100 mg by mouth every morning.     [provider]  memantine (NAMENDA) 10 MG tablet Take 1 tablet (10 mg total) by mouth 2 (two) times daily. 07/16/21   Lomax, Amy, NP  PARoxetine (PAXIL) 40 MG tablet Take 40 mg by mouth every morning.    [provider]      Allergies    Vicodin [hydrocodone-acetaminophen], Augmentin [amoxicillin-pot clavulanate], and Quinolones    Review of Systems   Review of Systems  Unable to perform ROS: Dementia    Physical  Exam Updated Vital Signs BP (!) 171/71 (BP Location: Right Arm)   Pulse 80   Temp 98.7 F (37.1 C) (Oral)   Resp 18   Ht 5' (1.524 m)   Wt 45 kg   SpO2 95%   BMI 19.38 kg/m  Physical Exam Vitals and nursing note reviewed.   81 year old female, resting comfortably and in no acute distress. Vital signs are significant for elevated blood pressure. Oxygen saturation is 95%, which is normal. Head is normocephalic and atraumatic. PERRLA, EOMI. Oropharynx is clear. Neck is nontender and supple without adenopathy or JVD. Back is nontender and there is no CVA tenderness. Lungs are clear without rales, wheezes, or rhonchi. Chest is nontender. Heart has regular rate and rhythm without murmur. Abdomen is soft, flat, nontender. Extremities have no cyanosis or edema, full range of motion is present. Skin is warm and dry without rash. Neurologic: Mental status is normal, cranial nerves are intact, moves all extremities equally.  ED Results / Procedures / Treatments   Labs (all labs ordered are listed, but only abnormal results are displayed) Labs Reviewed  COMPREHENSIVE METABOLIC PANEL - Abnormal; Notable for the following components:      Result Value   Glucose, Bld 124 (*)    All other components within normal limits  CBC WITH DIFFERENTIAL/PLATELET - Abnormal; Notable for the following components:  WBC 17.5 (*)    RBC 3.60 (*)    Hemoglobin 11.2 (*)    HCT 33.3 (*)    Neutro Abs 15.7 (*)    Lymphs Abs 0.6 (*)    Monocytes Absolute 1.1 (*)    All other components within normal limits  URINALYSIS, ROUTINE W REFLEX MICROSCOPIC   Radiology DG Chest Port 1 View  Result Date: 12/15/2021 CLINICAL DATA:  Malaise. EXAM: PORTABLE CHEST 1 VIEW COMPARISON:  Two-view chest x-ray 12/09/2021 FINDINGS: Heart size is normal. Dense atherosclerotic calcifications are again noted. Changes of COPD are noted. New asymmetric right middle lobe or lower lobe airspace opacity is present. IMPRESSION: New  asymmetric right middle lobe or lower lobe airspace disease. Findings are most concerning for early infection or aspiration. COPD. Electronically Signed   By: San Morelle M.D.   On: 12/15/2021 05:58    Procedures Procedures    Medications Ordered in ED Medications  cefTRIAXone (ROCEPHIN) 2 g in sodium chloride 0.9 % 100 mL IVPB (has no administration in time range)  azithromycin (ZITHROMAX) 500 mg in sodium chloride 0.9 % 250 mL IVPB (has no administration in time range)    ED Course/ Medical Decision Making/ A&P                           Medical Decision Making Amount and/or Complexity of Data Reviewed Labs: ordered. Radiology: ordered.   Vague complaints of not feeling well, unremarkable exam.  I have ordered CBC, comprehensive metabolic panel, urinalysis and chest x-ray.  Old records are reviewed, and she was seen on 12/09/2021 for her chest pain, but left AGAINST MEDICAL ADVICE before second troponin could be drawn.  Chest x-ray shows probable right lower lobe infiltrate.  I have independently viewed the image, and agree with the radiologist's interpretation.  I have reviewed and interpreted the laboratory tests and my interpretation is mild anemia which is at baseline, mild to moderate leukocytosis consistent with infection.  In spite of leukocytosis, patient does not appear septic.  Antibiotics have been ordered for community-acquired pneumonia.  Urinalysis is pending.  Case signed out to Dr. Regenia Skeeter.  Final Clinical Impression(s) / ED Diagnoses Final diagnoses:  Community acquired pneumonia of right lower lobe of lung  Normochromic normocytic anemia  Elevated blood pressure reading with diagnosis of hypertension    Rx / DC Orders ED Discharge Orders     None         Delora Fuel, MD 39/76/73 [phone]

## 2021-12-15 NOTE — ED Provider Notes (Signed)
Care transferred to me.  Patient has pneumonia but otherwise is doing well with normal O2 sats and no increased work of breathing.  Urinalysis interpreted by me, no UTI.  Given amoxicillin allergy, will give cefpodoxime and azithromycin.  Does not tolerate fluoroquinolones.  Discharged home with return precautions.   Sherwood Gambler, MD 12/15/21 1041

## 2021-12-15 NOTE — Discharge Instructions (Addendum)
The x-ray today shows pneumonia.  You are being given 2 different antibiotics to help treat this.  She was given the first dose of antibiotics here in the emergency department, can start the next tomorrow.    If she develops coughing up blood, fever, trouble breathing, or any other new/concerning symptoms then return to the ER for evaluation.

## 2021-12-24 DEATH — deceased

## 2022-01-15 ENCOUNTER — Ambulatory Visit: Payer: Medicare Other | Admitting: Family Medicine

## 2022-01-15 ENCOUNTER — Encounter: Payer: Self-pay | Admitting: Family Medicine

## 2022-01-15 VITALS — BP 142/60 | HR 63 | Ht 60.0 in | Wt 93.2 lb

## 2022-01-15 DIAGNOSIS — F419 Anxiety disorder, unspecified: Secondary | ICD-10-CM

## 2022-01-15 DIAGNOSIS — F0280 Dementia in other diseases classified elsewhere without behavioral disturbance: Secondary | ICD-10-CM

## 2022-01-15 DIAGNOSIS — G301 Alzheimer's disease with late onset: Secondary | ICD-10-CM

## 2022-01-15 DIAGNOSIS — F32A Depression, unspecified: Secondary | ICD-10-CM

## 2022-01-15 MED ORDER — BUSPIRONE HCL 5 MG PO TABS
5.0000 mg | ORAL_TABLET | Freq: Two times a day (BID) | ORAL | 3 refills | Status: DC
Start: 2022-01-15 — End: 2023-01-15

## 2022-01-15 MED ORDER — MEMANTINE HCL 10 MG PO TABS: 10.0000 mg | ORAL_TABLET | Freq: Two times a day (BID) | ORAL | 3 refills | Status: DC

## 2022-01-15 NOTE — Progress Notes (Signed)
PATIENT: Sophia Coleman DOB: Dec 02, 1940  REASON FOR VISIT: follow up HISTORY FROM: patient  Chief Complaint  Patient presents with   Follow-up    Rm 6, w daughter. Here for 6 month f/u for AD. Per daughter pts memory has worsened. Pt continues to get confused and forgetful. No falls. MMSE:16   HISTORY OF PRESENT ILLNESS:  01/15/22 ALL:  Sophia Coleman returns for follow up for AD. She continues buspirone and memantine. Her daughter reports that she is only taking these once instead of twice daily. She has a caregiver, Marcie Bal, who comes every day from 11-2. She is much more repetitive in questioning. She is more irritable. She seems more paranoid at times. She has taken paroxetine '40mg'$  daily for years. She is sleeping well. She seems more alert and clear in the afternoons and evenings. Mornings are harder for her. Weight is fairly stable. Her daughter does have concerns of her forgetting to eat. She keeps Boost for supplements. She is still performing ADLs independently but likes for someone to be with her when showering. She has found her keys several times and driven. Fortunately. No accidents or getting lost.   07/16/2021 ALL: Sophia Coleman returns for follow up for AD. We continued buspirone '5mg'$  BID and increased memantine to '10mg'$  daily. She seems to have tolerated medication adjustments. No significant improvement in memory. She continues to have anxiety. She will call daughter in law most mornings telling her she is sick and needs the daughter in law to come over. She has stopped cooking as much. [name] feels that she may be forgetting to eat. She has a good appetite when [name] is with her. Eating more sweets. She has gained 3 pounds since last year. She was started on statin with PCP. Able to perform ADLs but will ask for help if someone is close. She is sleeping well. She naps more during the day. She is no longer driving. No falls. She reports that BP is usually ok at home. She forgot to take her BP  meds yesterday.   01/07/2021 ALL:  Sophia Coleman returns for follow up for AD. She presents with her daughter, Lynelle Smoke, who aids in history. We continued Buspar twice daily and added memantine '5mg'$  BID at last visit 07/2020. [name] reports that she has taken memantin '5mg'$  daily and is tolerating well. [name] organizes her medicine container weekly and is concerned that Cydnie will not be able to remember taking medicaitons twice daily. She leaves buspirone on window seal and usually just takes it at night. Memory seems stable. She rarely drives. She is eating normally. Able to perform all ADLs. No falls.   07/24/2020 ALL:  She is doing fairly well, today. No significant worsening. Her daughter does feel that short term memory continues to progress. She has had more concerns of anxiety attacks over the past month. Her daughter reports that she is calling her or her sister in law every morning stating that something is wrong. And that she needs help right away. By the time someone gets to her, she doesn't remember calling. It has been harder for her to shop independently. Her daughter is helping with grocery shopping. She rarely drives. She is seeing PCP annually. She continues Paxil '40mg'$  daily. She has had nightmares with Aricept. She reports trying Namenda in the past but her daughter does not think she ever started it.   01/25/2020 ALL:  Sophia Coleman is a 81 y.o. female here today for follow up for memory loss.  We started her on a trial of BuSpar 5 mg at bedtime in March.  She does feel like this medication has helped significantly with her anxiety.  Her daughter presents with her today and is in agreement.  She feels that she is sleeping better.  She continues to be active.  She is driving without difficulty.  Driving is only in short, local, familiar areas.  Her daughter reports that she does have more irritability when performing difficult task.  She continues to assist her daughter with finances.  Her daughter  reports that she will often become irritable and refused to help.  Short-term memory continues to be the biggest concern.  Otherwise, she seems to be doing very well.  HISTORY: (copied from my note on 07/25/2019)  Sophia Coleman is a 81 y.o. female here today for follow up for memory loss. She tried Namenda but did not like how she felt on it and stopped. She feels that it caused nightmares. She also had nightmares on Aricept. Her daughter is with her today and aids in history. She is doing fairly well. She continues to live alone. She is able to perform all ADL's independently. She is eating well. She continues to note anxiety and depression. She continues Paxil daily prescribed by PCP. Her daughter reports that she is having more panic symptoms recently. She gets worked up very quickly. She has family that live across the street and can come when she needs help. They have had to call EMS recently due to panic attacks. She has taken Xanax in the past for anxiety that does help but patient and daughter are hesitant to continue medication.   HISTORY: (copied from my note on 01/18/2019)   Sophia Coleman is a 81 y.o. female here today for follow up for concerns of memory loss. She has tried Aricept in the past with concerns unwanted dreams. She was advised to consider Namenda at last visit.  She feels that this may be a reasonable medication to consider.  Admits to suffering from depression.  She has been on Paxil 40 mg for many years.  She has attempted to switch to a different SSRI in the past but feels that she was unable to tolerate increased anxiety.  She lives at home alone.  She is able to perform all ADLs.  She is able to dose her own medications.  She does have family members that check on her regularly and live close by.  She continues to drive locally and short distances.  There have been no concerns of accidents or getting lost.  No falls at home.   HISTORY: (copied from Dr Gladstone Lighter note on  09/16/2018)   81 year old female here for evaluation of memory loss and confusion.   Patient has had 6 to 12 months of mild short-term memory loss, forgetting recent conversations and events, names of grandchildren, without change in ADLs.   In April 2020 she had a more severe change in symptoms.  She had anxiety, shortness of breath, lightheadedness and confusion.  Patient went to the hospital on 09/01/2018 for evaluation.  Lab testing were unremarkable.  She was discharged home.   09/03/2018 patient continued to have symptoms and was very confused and amnestic to her first emergency room visit.  Therefore she went back to the emergency room and had CT scan of the head which was unremarkable.   Since that time patient is doing little bit better.   Patient typically lives alone.  Currently her family  is with her.  She normally is able to take care of all of her ADLs.   Her PCP started her on Aricept 5 mg, but she took this dose and had severe crazy dreams, and she stopped the medication.   Patient has history of emphysema, hypertension, heart disease, carotid artery disease status post endarterectomy, ongoing tobacco abuse.   REVIEW OF SYSTEMS: Out of a complete 14 system review of symptoms, the patient complains only of the following symptoms, memory loss, anxiety and all other reviewed systems are negative.   ALLERGIES: Allergies  Allergen Reactions   Vicodin [Hydrocodone-Acetaminophen] Nausea And Vomiting   Augmentin [Amoxicillin-Pot Clavulanate] Other (See Comments)    "SEVERE SWEATS" Has patient had a PCN reaction causing immediate rash, facial/tongue/throat swelling, SOB or lightheadedness with hypotension: No Has patient had a PCN reaction causing severe rash involving mucus membranes or skin necrosis: No Has patient had a PCN reaction that required hospitalization: No Has patient had a PCN reaction occurring within the last 10 years: Yes If all of the above answers are "NO", then  may proceed with Cephalosporin use.    Quinolones Nausea And Vomiting    HOME MEDICATIONS: Outpatient Medications Prior to Visit  Medication Sig Dispense Refill   atorvastatin (LIPITOR) 10 MG tablet Take 10 mg by mouth daily.     ferrous sulfate 325 (65 FE) MG tablet Take 1 tablet (325 mg total) by mouth daily. 30 tablet 0   losartan (COZAAR) 100 MG tablet Take 100 mg by mouth every morning.      PARoxetine (PAXIL) 40 MG tablet Take 40 mg by mouth every morning.     busPIRone (BUSPAR) 5 MG tablet Take 1 tablet (5 mg total) by mouth 2 (two) times daily. 60 tablet 11   memantine (NAMENDA) 10 MG tablet Take 1 tablet (10 mg total) by mouth 2 (two) times daily. 180 tablet 1   azithromycin (ZITHROMAX) 250 MG tablet Take 1 tablet (250 mg total) by mouth daily. 4 tablet 0   No facility-administered medications prior to visit.    PAST MEDICAL HISTORY: Past Medical History:  Diagnosis Date   Anemia    Anxiety    Arthritis    Carotid artery occlusion    Chronic kidney disease    Colon polyps    COPD (chronic obstructive pulmonary disease) (HCC)    Dementia (HCC)    Depression    Dyspnea    Heart murmur    Hypertension    Memory difficulty    Pneumonia    PONV (postoperative nausea and vomiting)     PAST SURGICAL HISTORY: Past Surgical History:  Procedure Laterality Date   CAROTID ENDARTERECTOMY     CATARACT EXTRACTION W/PHACO Left 01/02/2014   Procedure: CATARACT EXTRACTION PHACO AND INTRAOCULAR LENS PLACEMENT ([place]);  Surgeon: Tonny Branch, MD;  Location: [facility];  Service: Ophthalmology;  Laterality: Left;  CDE 8.17   CATARACT EXTRACTION W/PHACO Right 03/06/2014   Procedure: CATARACT EXTRACTION PHACO AND INTRAOCULAR LENS PLACEMENT ([facility]);  Surgeon: Tonny Branch, MD;  Location: [facility];  Service: Ophthalmology;  Laterality: Right;  CDE:15.14   CHOLECYSTECTOMY     COLONOSCOPY  11/13/2011   Procedure: COLONOSCOPY;  Surgeon: Rogene Houston, MD;  Location: [facility];  Service:  Endoscopy;  Laterality: N/A;  1030   COLONOSCOPY N/A 01/22/2017   Procedure: COLONOSCOPY;  Surgeon: Rogene Houston, MD;  Location: [facility];  Service: Endoscopy;  Laterality: N/A;  1030   ENDARTERECTOMY Left 04/02/2016  Procedure: ENDARTERECTOMY LEFT CAROTID ARTERY;  Surgeon: Rosetta Posner, MD;  Location: [place];  Service: Vascular;  Laterality: Left;   PATCH ANGIOPLASTY Left 04/02/2016   Procedure: PATCH ANGIOPLASTY LEFT CAROTID ARTERY USING HEMASHIELD PLATINUM FINESS PATCH;  Surgeon: Rosetta Posner, MD;  Location[facility];  Service: Vascular;  Laterality: Left;    FAMILY HISTORY: Family History  Problem Relation Age of Onset   Heart murmur Mother    Heart Problems Mother    Heart disease Mother    Hypertension Mother    Glaucoma Mother    Emphysema Father        COPD   Cancer Sister    Lung cancer Sister    Dementia Brother    Stroke Maternal Grandmother    Heart attack Maternal Grandfather    Kidney disease Paternal Grandfather    Colon cancer Neg Hx     SOCIAL HISTORY: Social History   Socioeconomic History   Marital status: Widowed    Spouse name: Not on file   Number of children: 2   Years of education: Not on file   Highest education level: Not on file  Occupational History    Comment: retired  Tobacco Use   Smoking status: Every Day    Packs/day: 1.00    Years: 50.00    Total pack years: 50.00    Types: Cigarettes   Smokeless tobacco: Never  Vaping Use   Vaping Use: Never used  Substance and Sexual Activity   Alcohol use: No   Drug use: No   Sexual activity: Not on file  Other Topics Concern   Not on file  Social History Narrative   Lives alone   Social Determinants of Health   Financial Resource Strain: Not on file  Food Insecurity: Not on file  Transportation Needs: Not on file  Physical Activity: Not on file  Stress: Not on file  Social Connections: Not on file  Intimate Partner Violence: Not on file      PHYSICAL EXAM  Vitals:    01/15/22 1323  BP: (!) 142/60  Pulse: 63  Weight: 93 lb 3.2 oz (42.3 kg)  Height: 5' (1.524 m)      Body mass index is 18.2 kg/m.  Generalized: Well developed, in no acute distress  Cardiology: normal rate and rhythm, no murmur noted Respiratory: clear to auscultation bilaterally  Neurological examination  Mentation: Alert oriented to time, place, history taking. Follows all commands speech and language fluent Cranial nerve II-XII: Pupils were equal round reactive to light. Extraocular movements were full, visual field were full on confrontational test. Facial sensation and strength were normal. Head turning and shoulder shrug  were normal and symmetric. Motor: The motor testing reveals 5 over 5 strength of all 4 extremities. Good symmetric motor tone is noted throughout.  Gait and station: Gait is normal.    DIAGNOSTIC DATA (LABS, IMAGING, TESTING) - I reviewed patient records, labs, notes, testing and imaging myself where available.     01/15/2022    1:38 PM 07/16/2021   11:03 AM 01/07/2021   11:03 AM  MMSE - Mini Mental State Exam  Orientation to time 0 1 2  Orientation to Place '5 4 4  '$ Registration '3 3 3  '$ Attention/ Calculation 0 0 3  Recall 0 0 0  Language- name 2 objects '2 2 2  '$ Language- repeat '1 1 1  '$ Language- follow 3 step command '3 2 3  '$ Language- read & follow direction 1 1 1  Write a sentence '1 1 1  '$ Copy design 0 1 0  Total score '16 16 20     '$ Lab Results  Component Value Date   WBC 17.5 (H) 12/15/2021   HGB 11.2 (L) 12/15/2021   HCT 33.3 (L) 12/15/2021   MCV 92.5 12/15/2021   PLT 315 12/15/2021      Component Value Date/Time   NA 135 12/15/2021 0558   K 4.0 12/15/2021 0558   CL 106 12/15/2021 0558   CO2 24 12/15/2021 0558   GLUCOSE 124 (H) 12/15/2021 0558   BUN 20 12/15/2021 0558   CREATININE 0.96 12/15/2021 0558   CREATININE 0.89 06/23/2019 1308   CALCIUM 9.7 12/15/2021 0558   PROT 6.8 12/15/2021 0558   ALBUMIN 3.6 12/15/2021 0558   AST 19  12/15/2021 0558   ALT 13 12/15/2021 0558   ALKPHOS 64 12/15/2021 0558   BILITOT 0.4 12/15/2021 0558   GFRNONAA >60 12/15/2021 0558   GFRNONAA 63 06/23/2019 1308   GFRAA >60 07/05/2019 1919   GFRAA 72 06/23/2019 1308   No results found for: "CHOL", "HDL", "LDLCALC", "LDLDIRECT", "TRIG", "CHOLHDL" No results found for: "HGBA1C" Lab Results  Component Value Date   VITAMINB12 357 06/16/2017   No results found for: "TSH"     ASSESSMENT AND PLAN 81 y.o. year old female  has a past medical history of Anemia, Anxiety, Arthritis, Carotid artery occlusion, Chronic kidney disease, Colon polyps, COPD (chronic obstructive pulmonary disease) ([place]), Dementia ([place]), Depression, Dyspnea, Heart murmur, Hypertension, Memory difficulty, Pneumonia, and PONV (postoperative nausea and vomiting). here with     ICD-10-CM   1. Late onset Alzheimer's dementia without behavioral disturbance (HCC)  G30.1    F02.80     2. Anxiety and depression  F41.9    F32.A       [place] is doing fairly well, today. Memory continues to decline. MMSE stable, 16/30, previously 16/30. Anxiety seems better on buspirone. We will continue '5mg'$  up to twice daily. May increase in the future if needed. We will continue memantine 10 twice daily. Her daughter will help with medication administration and monitor for side effects. We will monitor for worsening paranoia. May consider psychiatry if needed. She will stay well-hydrated and eat well-balanced meals. Advised not to drive. She will continue follow-up with primary care as directed.  She will follow-up with me in 6 months, sooner if needed.  She verbalizes understanding and agreement with this plan.   No orders of the defined types were placed in this encounter.     Meds ordered this encounter  Medications   busPIRone (BUSPAR) 5 MG tablet    Sig: Take 1 tablet (5 mg total) by mouth 2 (two) times daily.    Dispense:  180 tablet    Refill:  3    Order Specific Question:    Supervising Provider    Answer:   Melvenia Beam [[id number]]   memantine (NAMENDA) 10 MG tablet    Sig: Take 1 tablet (10 mg total) by mouth 2 (two) times daily.    Dispense:  180 tablet    Refill:  3    Order Specific Question:   Supervising Provider    Answer:   Melvenia Beam [[id number]]      SAY TKZSW, FNP-C 01/15/2022, 2:42 PM [facility] [address] [place], [place] [address] [phone]

## 2022-01-15 NOTE — Patient Instructions (Signed)
Below is our plan:  We will continue buspirone and memantine. I do feel they will work better twice daily but use discretion in whether to enforce this. I do not recommend that she drive at this time. I would encourage consideration of some sort of social active. Keep an eye on her diet. We may consider discussion with Dr Hilma Favors regarding Paxil dose or need for geriatric psychiatry if irritability/paranoia worsens.   Please make sure you are staying well hydrated. I recommend 50-60 ounces daily. Well balanced diet and regular exercise encouraged. Consistent sleep schedule with 6-8 hours recommended.   Please continue follow up with care team as directed.   Follow up with me in 6 months   You may receive a survey regarding today's visit. I encourage you to leave honest feed back as I do use this information to improve patient care. Thank you for seeing me today!   Management of Memory Problems   There are some general things you can do to help manage your memory problems.  Your memory may not in fact recover, but by using techniques and strategies you will be able to manage your memory difficulties better.   1)  Establish a routine. Try to establish and then stick to a regular routine.  By doing this, you will get used to what to expect and you will reduce the need to rely on your memory.  Also, try to do things at the same time of day, such as taking your medication or checking your calendar first thing in the morning. Think about think that you can do as a part of a regular routine and make a list.  Then enter them into a daily planner to remind you.  This will help you establish a routine.   2)  Organize your environment. Organize your environment so that it is uncluttered.  Decrease visual stimulation.  Place everyday items such as keys or cell phone in the same place every day (ie.  Basket next to front door) Use post it notes with a brief message to yourself (ie. Turn off light, lock the  door) Use labels to indicate where things go (ie. Which cupboards are for food, dishes, etc.) Keep a notepad and pen by the telephone to take messages   3)  Memory Aids A diary or journal/notebook/daily planner Making a list (shopping list, chore list, to do list that needs to be done) Using an alarm as a reminder (kitchen timer or cell phone alarm) Using cell phone to store information (Notes, Calendar, Reminders) Calendar/White board placed in a prominent position Post-it notes   In order for memory aids to be useful, you need to have good habits.  It's no good remembering to make a note in your journal if you don't remember to look in it.  Try setting aside a certain time of day to look in journal.   4)  Improving mood and managing fatigue. There may be other factors that contribute to memory difficulties.  Factors, such as anxiety, depression and tiredness can affect memory. Regular gentle exercise can help improve your mood and give you more energy. Simple relaxation techniques may help relieve symptoms of anxiety Try to get back to completing activities or hobbies you enjoyed doing in the past. Learn to pace yourself through activities to decrease fatigue. Find out about some local support groups where you can share experiences with others. Try and achieve 7-8 hours of sleep at night.

## 2022-06-05 DIAGNOSIS — Z681 Body mass index (BMI) 19 or less, adult: Secondary | ICD-10-CM | POA: Diagnosis not present

## 2022-06-05 DIAGNOSIS — Z0001 Encounter for general adult medical examination with abnormal findings: Secondary | ICD-10-CM | POA: Diagnosis not present

## 2022-06-05 DIAGNOSIS — Z1331 Encounter for screening for depression: Secondary | ICD-10-CM | POA: Diagnosis not present

## 2022-06-05 DIAGNOSIS — I10 Essential (primary) hypertension: Secondary | ICD-10-CM | POA: Diagnosis not present

## 2022-06-05 DIAGNOSIS — D509 Iron deficiency anemia, unspecified: Secondary | ICD-10-CM | POA: Diagnosis not present

## 2022-06-05 DIAGNOSIS — F17290 Nicotine dependence, other tobacco product, uncomplicated: Secondary | ICD-10-CM | POA: Diagnosis not present

## 2022-06-05 DIAGNOSIS — F03A0 Unspecified dementia, mild, without behavioral disturbance, psychotic disturbance, mood disturbance, and anxiety: Secondary | ICD-10-CM | POA: Diagnosis not present

## 2022-06-05 DIAGNOSIS — M810 Age-related osteoporosis without current pathological fracture: Secondary | ICD-10-CM | POA: Diagnosis not present

## 2022-06-05 DIAGNOSIS — Z13220 Encounter for screening for lipoid disorders: Secondary | ICD-10-CM | POA: Diagnosis not present

## 2022-06-05 DIAGNOSIS — J449 Chronic obstructive pulmonary disease, unspecified: Secondary | ICD-10-CM | POA: Diagnosis not present

## 2022-06-05 DIAGNOSIS — H6120 Impacted cerumen, unspecified ear: Secondary | ICD-10-CM | POA: Diagnosis not present

## 2022-06-23 ENCOUNTER — Encounter (HOSPITAL_COMMUNITY): Payer: Self-pay | Admitting: Internal Medicine

## 2022-07-09 ENCOUNTER — Encounter (HOSPITAL_COMMUNITY): Payer: Self-pay | Admitting: Internal Medicine

## 2022-07-14 ENCOUNTER — Other Ambulatory Visit (HOSPITAL_COMMUNITY): Payer: Self-pay | Admitting: Family Medicine

## 2022-07-14 DIAGNOSIS — Z1231 Encounter for screening mammogram for malignant neoplasm of breast: Secondary | ICD-10-CM

## 2022-07-22 ENCOUNTER — Ambulatory Visit: Payer: Medicare Other | Admitting: Podiatry

## 2022-07-22 ENCOUNTER — Encounter: Payer: Self-pay | Admitting: Podiatry

## 2022-07-22 DIAGNOSIS — B351 Tinea unguium: Secondary | ICD-10-CM

## 2022-07-22 DIAGNOSIS — M79674 Pain in right toe(s): Secondary | ICD-10-CM | POA: Diagnosis not present

## 2022-07-22 DIAGNOSIS — M79675 Pain in left toe(s): Secondary | ICD-10-CM | POA: Diagnosis not present

## 2022-07-23 ENCOUNTER — Ambulatory Visit: Payer: Medicare Other | Admitting: Podiatry

## 2022-07-23 ENCOUNTER — Ambulatory Visit: Payer: Medicare Other | Admitting: Family Medicine

## 2022-07-23 NOTE — Progress Notes (Signed)
  Subjective:  Patient ID: Sophia Coleman, female    DOB: 04-02-1941,  MRN: [id number]  Chief Complaint  Patient presents with   Nail Problem    np//Toenails trimmed - patient is taking Plavix    82 y.o. female presents with the above complaint. History confirmed with patient.  Her nails are thickened elongated discolored she and her daughter are unable to cut them  Objective:  Physical Exam: warm, good capillary refill, no trophic changes or ulcerative lesions, normal DP and PT pulses, and normal sensory exam. Left Foot: dystrophic yellowed discolored nail plates with subungual debris Right Foot: dystrophic yellowed discolored nail plates with subungual debris  Assessment:   1. Pain due to onychomycosis of toenails of both feet      Plan:  Patient was evaluated and treated and all questions answered.  Discussed the etiology and treatment options for the condition in detail with the patient. Educated patient on the topical and oral treatment options for mycotic nails. Recommended debridement of the nails today. Sharp and mechanical debridement performed of all painful and mycotic nails today. Nails debrided in length and thickness using a nail nipper to level of comfort. Discussed treatment options including appropriate shoe gear. Follow up as needed for painful nails.    Return in about 3 months (around 10/20/2022) for painful thick fungal nails.

## 2022-07-29 DIAGNOSIS — D509 Iron deficiency anemia, unspecified: Secondary | ICD-10-CM | POA: Diagnosis not present

## 2022-07-29 DIAGNOSIS — Z0001 Encounter for general adult medical examination with abnormal findings: Secondary | ICD-10-CM | POA: Diagnosis not present

## 2022-08-19 ENCOUNTER — Other Ambulatory Visit (HOSPITAL_COMMUNITY): Payer: Self-pay | Admitting: Family Medicine

## 2022-08-19 DIAGNOSIS — Z1231 Encounter for screening mammogram for malignant neoplasm of breast: Secondary | ICD-10-CM

## 2022-08-20 ENCOUNTER — Inpatient Hospital Stay (HOSPITAL_COMMUNITY): Admission: RE | Admit: 2022-08-20 | Payer: Medicare Other | Source: Ambulatory Visit

## 2022-08-20 DIAGNOSIS — Z1231 Encounter for screening mammogram for malignant neoplasm of breast: Secondary | ICD-10-CM

## 2022-09-03 ENCOUNTER — Encounter: Payer: Self-pay | Admitting: Orthopedic Surgery

## 2022-09-03 ENCOUNTER — Ambulatory Visit (INDEPENDENT_AMBULATORY_CARE_PROVIDER_SITE_OTHER): Payer: Medicare Other

## 2022-09-03 ENCOUNTER — Ambulatory Visit: Payer: Medicare Other | Admitting: Orthopedic Surgery

## 2022-09-03 ENCOUNTER — Ambulatory Visit (HOSPITAL_COMMUNITY)
Admission: RE | Admit: 2022-09-03 | Discharge: 2022-09-03 | Disposition: A | Discharge status: Home / Self Care | Payer: Medicare Other | Source: Ambulatory Visit | Attending: Family Medicine | Admitting: Family Medicine

## 2022-09-03 VITALS — BP 154/74 | HR 70 | Ht 60.0 in | Wt 94.0 lb

## 2022-09-03 DIAGNOSIS — M545 Low back pain, unspecified: Secondary | ICD-10-CM

## 2022-09-03 DIAGNOSIS — M4180 Other forms of scoliosis, site unspecified: Secondary | ICD-10-CM

## 2022-09-03 DIAGNOSIS — M4155 Other secondary scoliosis, thoracolumbar region: Secondary | ICD-10-CM

## 2022-09-03 DIAGNOSIS — R52 Pain, unspecified: Secondary | ICD-10-CM | POA: Diagnosis not present

## 2022-09-03 DIAGNOSIS — M4150 Other secondary scoliosis, site unspecified: Secondary | ICD-10-CM

## 2022-09-03 DIAGNOSIS — Z1231 Encounter for screening mammogram for malignant neoplasm of breast: Secondary | ICD-10-CM | POA: Diagnosis not present

## 2022-09-03 NOTE — Patient Instructions (Signed)
Please contact the clinic if the pain worsens and you are interested in considering injections in your back.

## 2022-09-04 NOTE — Progress Notes (Signed)
New Patient Visit  Assessment: Sophia Coleman is a 82 y.o. female with the following: 1. Degenerative scoliosis  Plan: Sophia Coleman continues to have pain in her lower back.  She does have some dementia, which makes it difficult for her to understand.  We reviewed radiographs in clinic today which demonstrates a degenerative scoliosis.  She has previously seen a Careers adviser, who recommended against surgery.  We did discuss proceeding with epidural steroid injections, which she is not interested at this time.  Otherwise, continue with activities as tolerated.  Medications as needed, including topical treatments, heating pad and/or ice.  If she has any further issues, she wishes to be seen with consideration for injections, she will contact the clinic.  Follow-up: Return if symptoms worsen or fail to improve.  Subjective:  Chief Complaint  Patient presents with   Back Pain    Long history of back pain states saw doctor in [place] about 5 years ago who did not recommend surgery but pain has gotten worse since then     History of Present Illness: Sophia Coleman is a 82 y.o. female who presents for evaluation of low back pain.  She has a long history of low back pain.  She was evaluated by a neurosurgeon several years ago, who recommended against surgery given her medical issues.  Since then, her pain progressively worsens.  She has a pain in her lower back, and does radiate distally.  Unclear if there is numbness or tingling, as she has some dementia.  No prior injections.  She takes Tylenol which provides some relief of her symptoms.   Review of Systems: No fevers or chills No numbness or tingling No chest pain No shortness of breath No bowel or bladder dysfunction No GI distress No headaches   Medical History:  Past Medical History:  Diagnosis Date   Anemia    Anxiety    Arthritis    Carotid artery occlusion    Chronic kidney disease    Colon polyps    COPD (chronic  obstructive pulmonary disease)    Dementia    Depression    Dyspnea    Heart murmur    Hypertension    Memory difficulty    Pneumonia    PONV (postoperative nausea and vomiting)     Past Surgical History:  Procedure Laterality Date   CAROTID ENDARTERECTOMY     CATARACT EXTRACTION W/PHACO Left 01/02/2014   Procedure: CATARACT EXTRACTION PHACO AND INTRAOCULAR LENS PLACEMENT (IOC);  Surgeon: Gemma Payor, MD;  Location: [facility];  Service: Ophthalmology;  Laterality: Left;  CDE 8.17   CATARACT EXTRACTION W/PHACO Right 03/06/2014   Procedure: CATARACT EXTRACTION PHACO AND INTRAOCULAR LENS PLACEMENT (IOC);  Surgeon: Gemma Payor, MD;  Location: [facility];  Service: Ophthalmology;  Laterality: Right;  CDE:15.14   CHOLECYSTECTOMY     COLONOSCOPY  11/13/2011   Procedure: COLONOSCOPY;  Surgeon: Malissa Hippo, MD;  Location: [facility];  Service: Endoscopy;  Laterality: N/A;  1030   COLONOSCOPY N/A 01/22/2017   Procedure: COLONOSCOPY;  Surgeon: Malissa Hippo, MD;  Location: [facility];  Service: Endoscopy;  Laterality: N/A;  1030   ENDARTERECTOMY Left 04/02/2016   Procedure: ENDARTERECTOMY LEFT CAROTID ARTERY;  Surgeon: Larina Earthly, MD;  Location: [facility] OR;  Service: Vascular;  Laterality: Left;   PATCH ANGIOPLASTY Left 04/02/2016   Procedure: PATCH ANGIOPLASTY LEFT CAROTID ARTERY USING HEMASHIELD PLATINUM FINESS PATCH;  Surgeon: Larina Earthly, MD;  [facility];  Service: Vascular;  Laterality: Left;    Family History  Problem Relation Age of Onset   Heart murmur Mother    Heart Problems Mother    Heart disease Mother    Hypertension Mother    Glaucoma Mother    Emphysema Father        COPD   Cancer Sister    Lung cancer Sister    Dementia Brother    Stroke Maternal Grandmother    Heart attack Maternal Grandfather    Kidney disease Paternal Grandfather    Colon cancer Neg Hx    Social History   Tobacco Use   Smoking status: Every Day    Packs/day: 1.00    Years: 50.00     Additional pack years: 0.00    Total pack years: 50.00    Types: Cigarettes   Smokeless tobacco: Never  Vaping Use   Vaping Use: Never used  Substance Use Topics   Alcohol use: No   Drug use: No    Allergies  Allergen Reactions   Vicodin [Hydrocodone-Acetaminophen] Nausea And Vomiting   Augmentin [Amoxicillin-Pot Clavulanate] Other (See Comments)    "SEVERE SWEATS" Has patient had a PCN reaction causing immediate rash, facial/tongue/throat swelling, SOB or lightheadedness with hypotension: No Has patient had a PCN reaction causing severe rash involving mucus membranes or skin necrosis: No Has patient had a PCN reaction that required hospitalization: No Has patient had a PCN reaction occurring within the last 10 years: Yes If all of the above answers are "NO", then may proceed with Cephalosporin use.    Quinolones Nausea And Vomiting    Current Meds  Medication Sig   atorvastatin (LIPITOR) 10 MG tablet Take 10 mg by mouth daily.   busPIRone (BUSPAR) 5 MG tablet Take 1 tablet (5 mg total) by mouth 2 (two) times daily.   ferrous sulfate 325 (65 FE) MG tablet Take 1 tablet (325 mg total) by mouth daily.   losartan (COZAAR) 100 MG tablet Take 100 mg by mouth every morning.    memantine (NAMENDA) 10 MG tablet Take 1 tablet (10 mg total) by mouth 2 (two) times daily.   PARoxetine (PAXIL) 40 MG tablet Take 40 mg by mouth every morning.    Objective: BP (!) 154/74   Pulse 70   Ht 5' (1.524 m)   Wt 94 lb (42.6 kg)   BMI 18.36 kg/m   Physical Exam:  General: Elderly female. and No acute distress. Gait: Slow, steady gait.  Evaluation of her back demonstrates an obvious curvature.  Diffuse tenderness to palpation over the lower back.  She has decent strength in her bilateral lower extremities.  Sensation is intact distally.  IMAGING: I personally ordered and reviewed the following images  X-rays of the lumbar spine were obtained in clinic today.  No acute injuries are noted.   There is is evidence of degenerative changes, with associated scoliosis.  Obvious deformity is noted on the coronal images.  Difficult to fully assess the disc height due to the curvature.  Diffuse osteophytes are appreciated.  No bony lesions.  Impression: Thoracolumbar degenerative scoliosis   New Medications:  No orders of the defined types were placed in this encounter.     Oliver Barre, MD  09/04/2022 11:27 PM

## 2022-11-18 ENCOUNTER — Ambulatory Visit: Payer: Medicare Other | Admitting: Podiatry

## 2022-11-25 ENCOUNTER — Ambulatory Visit: Payer: Medicare Other | Admitting: Podiatry

## 2023-01-14 NOTE — Progress Notes (Unsigned)
PATIENT: Sophia Coleman DOB: 05-13-1941  REASON FOR VISIT: follow up HISTORY FROM: patient  No chief complaint on file.  HISTORY OF PRESENT ILLNESS:  01/14/23 ALL:  Sophia Coleman returns for follow up for AD. She was last seen 12/2021. MMSE 16/30. We continued memantine 10mg  BID and buspirone 5mg  BID PRN. Since,   01/15/2022 ALL: Sophia Coleman returns for follow up for AD. She continues buspirone and memantine. Her daughter reports that she is only taking these once instead of twice daily. She has a caregiver, Sophia Coleman, who comes every day from 11-2. She is much more repetitive in questioning. She is more irritable. She seems more paranoid at times. She has taken paroxetine 40mg  daily for years. She is sleeping well. She seems more alert and clear in the afternoons and evenings. Mornings are harder for her. Weight is fairly stable. Her daughter does have concerns of her forgetting to eat. She keeps Boost for supplements. She is still performing ADLs independently but likes for someone to be with her when showering. She has found her keys several times and driven. Fortunately. No accidents or getting lost.   07/16/2021 ALL: Sophia Coleman returns for follow up for AD. We continued buspirone 5mg  BID and increased memantine to 10mg  daily. She seems to have tolerated medication adjustments. No significant improvement in memory. She continues to have anxiety. She will call daughter in law most mornings telling her she is sick and needs the daughter in law to come over. She has stopped cooking as much. Sophia Coleman feels that she may be forgetting to eat. She has a good appetite when Sophia Coleman is with her. Eating more sweets. She has gained 3 pounds since last year. She was started on statin with PCP. Able to perform ADLs but will ask for help if someone is close. She is sleeping well. She naps more during the day. She is no longer driving. No falls. She reports that BP is usually ok at home. She forgot to take her BP meds yesterday.    01/07/2021 ALL:  Sophia Coleman returns for follow up for AD. She presents with her daughter, Sophia Coleman, who aids in history. We continued Buspar twice daily and added memantine 5mg  BID at last visit 07/2020. Sophia Coleman reports that she has taken memantin 5mg  daily and is tolerating well. Sophia Coleman organizes her medicine container weekly and is concerned that Sophia Coleman will not be able to remember taking medicaitons twice daily. She leaves buspirone on window seal and usually just takes it at night. Memory seems stable. She rarely drives. She is eating normally. Able to perform all ADLs. No falls.   07/24/2020 ALL:  She is doing fairly well, today. No significant worsening. Her daughter does feel that short term memory continues to progress. She has had more concerns of anxiety attacks over the past month. Her daughter reports that she is calling her or her sister in law every morning stating that something is wrong. And that she needs help right away. By the time someone gets to her, she doesn't remember calling. It has been harder for her to shop independently. Her daughter is helping with grocery shopping. She rarely drives. She is seeing PCP annually. She continues Paxil 40mg  daily. She has had nightmares with Aricept. She reports trying Namenda in the past but her daughter does not think she ever started it.   01/25/2020 ALL:  Sophia Coleman is a 82 y.o. female here today for follow up for memory loss.  We started her on a trial  of BuSpar 5 mg at bedtime in March.  She does feel like this medication has helped significantly with her anxiety.  Her daughter presents with her today and is in agreement.  She feels that she is sleeping better.  She continues to be active.  She is driving without difficulty.  Driving is only in short, local, familiar areas.  Her daughter reports that she does have more irritability when performing difficult task.  She continues to assist her daughter with finances.  Her daughter reports that she  will often become irritable and refused to help.  Short-term memory continues to be the biggest concern.  Otherwise, she seems to be doing very well.  HISTORY: (copied from my note on 07/25/2019)  Sophia Coleman is a 82 y.o. female here today for follow up for memory loss. She tried Namenda but did not like how she felt on it and stopped. She feels that it caused nightmares. She also had nightmares on Aricept. Her daughter is with her today and aids in history. She is doing fairly well. She continues to live alone. She is able to perform all ADL's independently. She is eating well. She continues to note anxiety and depression. She continues Paxil daily prescribed by PCP. Her daughter reports that she is having more panic symptoms recently. She gets worked up very quickly. She has family that live across the street and can come when she needs help. They have had to call EMS recently due to panic attacks. She has taken Xanax in the past for anxiety that does help but patient and daughter are hesitant to continue medication.   HISTORY: (copied from my note on 01/18/2019)   Sophia Coleman is a 82 y.o. female here today for follow up for concerns of memory loss. She has tried Aricept in the past with concerns unwanted dreams. She was advised to consider Namenda at last visit.  She feels that this may be a reasonable medication to consider.  Admits to suffering from depression.  She has been on Paxil 40 mg for many years.  She has attempted to switch to a different SSRI in the past but feels that she was unable to tolerate increased anxiety.  She lives at home alone.  She is able to perform all ADLs.  She is able to dose her own medications.  She does have family members that check on her regularly and live close by.  She continues to drive locally and short distances.  There have been no concerns of accidents or getting lost.  No falls at home.   HISTORY: (copied from Dr Richrd Humbles note on 09/16/2018)    82 year old female here for evaluation of memory loss and confusion.   Patient has had 6 to 12 months of mild short-term memory loss, forgetting recent conversations and events, names of grandchildren, without change in ADLs.   In April 2020 she had a more severe change in symptoms.  She had anxiety, shortness of breath, lightheadedness and confusion.  Patient went to the hospital on 09/01/2018 for evaluation.  Lab testing were unremarkable.  She was discharged home.   09/03/2018 patient continued to have symptoms and was very confused and amnestic to her first emergency room visit.  Therefore she went back to the emergency room and had CT scan of the head which was unremarkable.   Since that time patient is doing little bit better.   Patient typically lives alone.  Currently her family is with her.  She normally  is able to take care of all of her ADLs.   Her PCP started her on Aricept 5 mg, but she took this dose and had severe crazy dreams, and she stopped the medication.   Patient has history of emphysema, hypertension, heart disease, carotid artery disease status post endarterectomy, ongoing tobacco abuse.   REVIEW OF SYSTEMS: Out of a complete 14 system review of symptoms, the patient complains only of the following symptoms, memory loss, anxiety and all other reviewed systems are negative.   ALLERGIES: Allergies  Allergen Reactions   Vicodin [Hydrocodone-Acetaminophen] Nausea And Vomiting   Augmentin [Amoxicillin-Pot Clavulanate] Other (See Comments)    "SEVERE SWEATS" Has patient had a PCN reaction causing immediate rash, facial/tongue/throat swelling, SOB or lightheadedness with hypotension: No Has patient had a PCN reaction causing severe rash involving mucus membranes or skin necrosis: No Has patient had a PCN reaction that required hospitalization: No Has patient had a PCN reaction occurring within the last 10 years: Yes If all of the above answers are "NO", then may proceed  with Cephalosporin use.    Quinolones Nausea And Vomiting    HOME MEDICATIONS: Outpatient Medications Prior to Visit  Medication Sig Dispense Refill   atorvastatin (LIPITOR) 10 MG tablet Take 10 mg by mouth daily.     busPIRone (BUSPAR) 5 MG tablet Take 1 tablet (5 mg total) by mouth 2 (two) times daily. 180 tablet 3   clopidogrel (PLAVIX) 75 MG tablet Take 75 mg by mouth daily. (Patient not taking: Reported on 09/03/2022)     ferrous sulfate 325 (65 FE) MG tablet Take 1 tablet (325 mg total) by mouth daily. 30 tablet 0   losartan (COZAAR) 100 MG tablet Take 100 mg by mouth every morning.      memantine (NAMENDA) 10 MG tablet Take 1 tablet (10 mg total) by mouth 2 (two) times daily. 180 tablet 3   PARoxetine (PAXIL) 40 MG tablet Take 40 mg by mouth every morning.     No facility-administered medications prior to visit.    PAST MEDICAL HISTORY: Past Medical History:  Diagnosis Date   Anemia    Anxiety    Arthritis    Carotid artery occlusion    Chronic kidney disease    Colon polyps    COPD (chronic obstructive pulmonary disease) (HCC)    Dementia (HCC)    Depression    Dyspnea    Heart murmur    Hypertension    Memory difficulty    Pneumonia    PONV (postoperative nausea and vomiting)     PAST SURGICAL HISTORY: Past Surgical History:  Procedure Laterality Date   CAROTID ENDARTERECTOMY     CATARACT EXTRACTION W/PHACO Left 01/02/2014   Procedure: CATARACT EXTRACTION PHACO AND INTRAOCULAR LENS PLACEMENT (IOC);  Surgeon: Gemma Payor, MD;  Location: [facility];  Service: Ophthalmology;  Laterality: Left;  CDE 8.17   CATARACT EXTRACTION W/PHACO Right 03/06/2014   Procedure: CATARACT EXTRACTION PHACO AND INTRAOCULAR LENS PLACEMENT (IOC);  Surgeon: Gemma Payor, MD;  Location: [facility];  Service: Ophthalmology;  Laterality: Right;  CDE:15.14   CHOLECYSTECTOMY     COLONOSCOPY  11/13/2011   Procedure: COLONOSCOPY;  Surgeon: Malissa Hippo, MD;  Location: [facility];  Service:  Endoscopy;  Laterality: N/A;  1030   COLONOSCOPY N/A 01/22/2017   Procedure: COLONOSCOPY;  Surgeon: Malissa Hippo, MD;  Location: [facility];  Service: Endoscopy;  Laterality: N/A;  1030   ENDARTERECTOMY Left 04/02/2016   Procedure: ENDARTERECTOMY LEFT CAROTID  ARTERY;  Surgeon: Larina Earthly, MD;  Location: [facility] OR;  Service: Vascular;  Laterality: Left;   PATCH ANGIOPLASTY Left 04/02/2016   Procedure: PATCH ANGIOPLASTY LEFT CAROTID ARTERY USING HEMASHIELD PLATINUM FINESS PATCH;  Surgeon: Larina Earthly, MD;  Location: [facility] OR;  Service: Vascular;  Laterality: Left;    FAMILY HISTORY: Family History  Problem Relation Age of Onset   Heart murmur Mother    Heart Problems Mother    Heart disease Mother    Hypertension Mother    Glaucoma Mother    Emphysema Father        COPD   Cancer Sister    Lung cancer Sister    Dementia Brother    Stroke Maternal Grandmother    Heart attack Maternal Grandfather    Kidney disease Paternal Grandfather    Colon cancer Neg Hx     SOCIAL HISTORY: Social History   Socioeconomic History   Marital status: Widowed    Spouse name: Not on file   Number of children: 2   Years of education: Not on file   Highest education level: Not on file  Occupational History    Comment: retired  Tobacco Use   Smoking status: Every Day    Current packs/day: 1.00    Average packs/day: 1 pack/day for 50.0 years (50.0 ttl pk-yrs)    Types: Cigarettes   Smokeless tobacco: Never  Vaping Use   Vaping status: Never Used  Substance and Sexual Activity   Alcohol use: No   Drug use: No   Sexual activity: Not on file  Other Topics Concern   Not on file  Social History Narrative   Lives alone   Social Determinants of Health   Financial Resource Strain: Not on file  Food Insecurity: Not on file  Transportation Needs: Not on file  Physical Activity: Not on file  Stress: Not on file  Social Connections: Not on file  Intimate Partner Violence: Not on file       PHYSICAL EXAM  There were no vitals filed for this visit.     There is no height or weight on file to calculate BMI.  Generalized: Well developed, in no acute distress  Cardiology: normal rate and rhythm, no murmur noted Respiratory: clear to auscultation bilaterally  Neurological examination  Mentation: Alert oriented to time, place, history taking. Follows all commands speech and language fluent Cranial nerve II-XII: Pupils were equal round reactive to light. Extraocular movements were full, visual field were full on confrontational test. Facial sensation and strength were normal. Head turning and shoulder shrug  were normal and symmetric. Motor: The motor testing reveals 5 over 5 strength of all 4 extremities. Good symmetric motor tone is noted throughout.  Gait and station: Gait is normal.    DIAGNOSTIC DATA (LABS, IMAGING, TESTING) - I reviewed patient records, labs, notes, testing and imaging myself where available.     01/15/2022    1:38 PM 07/16/2021   11:03 AM 01/07/2021   11:03 AM  MMSE - Mini Mental State Exam  Orientation to time 0 1 2  Orientation to Place 5 4 4   Registration 3 3 3   Attention/ Calculation 0 0 3  Recall 0 0 0  Language- name 2 objects 2 2 2   Language- repeat 1 1 1   Language- follow 3 step command 3 2 3   Language- read & follow direction 1 1 1   Write a sentence 1 1 1   Copy design 0 1 0  Total  score 16 16 20      Lab Results  Component Value Date   WBC 17.5 (H) 12/15/2021   HGB 11.2 (L) 12/15/2021   HCT 33.3 (L) 12/15/2021   MCV 92.5 12/15/2021   PLT 315 12/15/2021      Component Value Date/Time   NA 135 12/15/2021 0558   K 4.0 12/15/2021 0558   CL 106 12/15/2021 0558   CO2 24 12/15/2021 0558   GLUCOSE 124 (H) 12/15/2021 0558   BUN 20 12/15/2021 0558   CREATININE 0.96 12/15/2021 0558   CREATININE 0.89 06/23/2019 1308   CALCIUM 9.7 12/15/2021 0558   PROT 6.8 12/15/2021 0558   ALBUMIN 3.6 12/15/2021 0558   AST 19 12/15/2021  0558   ALT 13 12/15/2021 0558   ALKPHOS 64 12/15/2021 0558   BILITOT 0.4 12/15/2021 0558   GFRNONAA >60 12/15/2021 0558   GFRNONAA 63 06/23/2019 1308   GFRAA >60 07/05/2019 1919   GFRAA 72 06/23/2019 1308   No results found for: "CHOL", "HDL", "LDLCALC", "LDLDIRECT", "TRIG", "CHOLHDL" No results found for: "HGBA1C" Lab Results  Component Value Date   VITAMINB12 357 06/16/2017   No results found for: "TSH"     ASSESSMENT AND PLAN 82 y.o. year old female  has a past medical history of Anemia, Anxiety, Arthritis, Carotid artery occlusion, Chronic kidney disease, Colon polyps, COPD (chronic obstructive pulmonary disease) (HCC), Dementia (HCC), Depression, Dyspnea, Heart murmur, Hypertension, Memory difficulty, Pneumonia, and PONV (postoperative nausea and vomiting). here with   No diagnosis found.   Fayelynn is doing fairly well, today. Memory continues to decline. MMSE stable, 16/30, previously 16/30. Anxiety seems better on buspirone. We will continue 5mg  up to twice daily. May increase in the future if needed. We will continue memantine 10 twice daily. Her daughter will help with medication administration and monitor for side effects. We will monitor for worsening paranoia. May consider psychiatry if needed. She will stay well-hydrated and eat well-balanced meals. Advised not to drive. She will continue follow-up with primary care as directed.  She will follow-up with me in 6 months, sooner if needed.  She verbalizes understanding and agreement with this plan.   No orders of the defined types were placed in this encounter.     No orders of the defined types were placed in this encounter.     Shawnie Dapper, FNP-C 01/14/2023, 1:22 PM [facility] [address], [address] [place], [place] [address] [phone]

## 2023-01-14 NOTE — Patient Instructions (Signed)
Below is our plan:  We will continue donepezil 10mg and memantine 10mg daily  Please make sure you are staying well hydrated. I recommend 50-60 ounces daily. Well balanced diet and regular exercise encouraged. Consistent sleep schedule with 6-8 hours recommended.   Please continue follow up with care team as directed.   Follow up with me in 1 year   You may receive a survey regarding today's visit. I encourage you to leave honest feed back as I do use this information to improve patient care. Thank you for seeing me today!   Management of Memory Problems   There are some general things you can do to help manage your memory problems.  Your memory may not in fact recover, but by using techniques and strategies you will be able to manage your memory difficulties better.   1)  Establish a routine. Try to establish and then stick to a regular routine.  By doing this, you will get used to what to expect and you will reduce the need to rely on your memory.  Also, try to do things at the same time of day, such as taking your medication or checking your calendar first thing in the morning. Think about think that you can do as a part of a regular routine and make a list.  Then enter them into a daily planner to remind you.  This will help you establish a routine.   2)  Organize your environment. Organize your environment so that it is uncluttered.  Decrease visual stimulation.  Place everyday items such as keys or cell phone in the same place every day (ie.  Basket next to front door) Use post it notes with a brief message to yourself (ie. Turn off light, lock the door) Use labels to indicate where things go (ie. Which cupboards are for food, dishes, etc.) Keep a notepad and pen by the telephone to take messages   3)  Memory Aids A diary or journal/notebook/daily planner Making a list (shopping list, chore list, to do list that needs to be done) Using an alarm as a reminder (kitchen timer or cell  phone alarm) Using cell phone to store information (Notes, Calendar, Reminders) Calendar/White board placed in a prominent position Post-it notes   In order for memory aids to be useful, you need to have good habits.  It's no good remembering to make a note in your journal if you don't remember to look in it.  Try setting aside a certain time of day to look in journal.   4)  Improving mood and managing fatigue. There may be other factors that contribute to memory difficulties.  Factors, such as anxiety, depression and tiredness can affect memory. Regular gentle exercise can help improve your mood and give you more energy. Exercise: there are short videos created by the National Institute on Health specially for older adults: https://bit.ly/2I30q97.  Mediterranean diet: which emphasizes fruits, vegetables, whole grains, legumes, fish, and other seafood; unsaturated fats such as olive oils; and low amounts of red meat, eggs, and sweets. A variation of this, called MIND (Mediterranean-DASH Intervention for Neurodegenerative Delay) incorporates the DASH (Dietary Approaches to Stop Hypertension) diet, which has been shown to lower high blood pressure, a risk factor for Alzheimer's disease. More information at: https://www.nia.nih.gov/health/what-do-we-know-about-diet-and-prevention-alzheimers-disease.  Aerobic exercise that improve heart health is also good for the mind.  National Institute on Aging have short videos for exercises that you can do at home: www.nia.nih.gov/Go4Life Simple relaxation techniques may help relieve   symptoms of anxiety Try to get back to completing activities or hobbies you enjoyed doing in the past. Learn to pace yourself through activities to decrease fatigue. Find out about some local support groups where you can share experiences with others. Try and achieve 7-8 hours of sleep at night.   Resources for Family/Caregiver  Online caregiver support groups can be found at  alz.org or call Alzheimer's Association's 24/7 hotline: 800.272.3900. [facility] offers in-person, virtual support groups and individual counseling for both care partners and persons with memory loss. Call for more information at [phone].   Advanced care plan: there are two types of Power of Attorney: healthcare and durable. Healthcare POA is a designated person to make healthcare decisions on your behalf if you were too sick to make them yourself. This person can be selected and documented by your physician. Durable POA has to be set up with a lawyer who takes charge of your finances and estate if you were too sick or cognitively impaired to manage your finances accurately. You can find a local Elder Law lawyer here: https://www.naela.org/.  Check out www.planyourlifespan.org, which will help you plan before a crisis and decide who will take care of life considerations in a circumstance where you may not be able to speak for yourself.   Helpful books (available on Amazon or your local bookstore):  By Dr. Ed Shaw: Keeping Love Alive as Memories Fade: The 5 Love Languages and the Alzheimer's Journey Feb 24, 2015 The Dementia Care Partner's Workbook: A Guide for Understanding, Education, and Hope Paperback - October 24, 2017.  Both available for less than $15.   "Coping with behavior change in dementia: a family caregiver's guide" by Beth Spencer & Laurie White "A Caregiver's Guide to Dementia: Using Activities and Other Strategies to Prevent, Reduce and Manage Behavioral Symptoms" by Laura N. Gitlin and Catherine Piersol.  "Creating Moments of Joy for the Person with Alzheimer's or Dementia" 4th edition by Jolene Brackrey  Caregiver videos on common behaviors related to dementia: https://www.ucla[facility].org/dementia/caregiver-education-videos  [facility] Caregiver Portal: free to sign up, links to local resources: [email or web address]  

## 2023-01-15 ENCOUNTER — Encounter: Payer: Self-pay | Admitting: Family Medicine

## 2023-01-15 ENCOUNTER — Encounter: Payer: Self-pay | Admitting: Podiatry

## 2023-01-15 ENCOUNTER — Ambulatory Visit: Payer: Medicare Other | Admitting: Podiatry

## 2023-01-15 ENCOUNTER — Ambulatory Visit: Payer: Medicare Other | Admitting: Family Medicine

## 2023-01-15 VITALS — BP 149/69 | HR 65 | Ht 61.0 in | Wt 94.4 lb

## 2023-01-15 DIAGNOSIS — B351 Tinea unguium: Secondary | ICD-10-CM

## 2023-01-15 DIAGNOSIS — F0280 Dementia in other diseases classified elsewhere without behavioral disturbance: Secondary | ICD-10-CM | POA: Diagnosis not present

## 2023-01-15 DIAGNOSIS — M79675 Pain in left toe(s): Secondary | ICD-10-CM | POA: Diagnosis not present

## 2023-01-15 DIAGNOSIS — F419 Anxiety disorder, unspecified: Secondary | ICD-10-CM | POA: Diagnosis not present

## 2023-01-15 DIAGNOSIS — M79674 Pain in right toe(s): Secondary | ICD-10-CM

## 2023-01-15 DIAGNOSIS — G301 Alzheimer's disease with late onset: Secondary | ICD-10-CM | POA: Diagnosis not present

## 2023-01-15 DIAGNOSIS — F32A Depression, unspecified: Secondary | ICD-10-CM | POA: Diagnosis not present

## 2023-01-15 MED ORDER — BUSPIRONE HCL 5 MG PO TABS: 5.0000 mg | ORAL_TABLET | Freq: Two times a day (BID) | ORAL | 3 refills | Status: DC

## 2023-01-15 MED ORDER — MEMANTINE HCL ER 14 MG PO CP24: 14.0000 mg | ORAL_CAPSULE | Freq: Every day | ORAL | 3 refills | Status: DC

## 2023-01-15 NOTE — Progress Notes (Signed)
  Subjective:  Patient ID: Sophia Coleman, female    DOB: Mar 31, 1941,  MRN: [id number]  Chief Complaint  Patient presents with   Nail Problem    RM4: patient is here for RFC/nail trim    82 y.o. female presents with the above complaint. History confirmed with patient.  Her nails are thickened elongated discolored she and her daughter are unable to cut them, prior debridement was helpful in reducing pain and improving function  Objective:  Physical Exam: warm, good capillary refill, no trophic changes or ulcerative lesions, normal DP and PT pulses, and normal sensory exam. Left Foot: dystrophic yellowed discolored nail plates with subungual debris Right Foot: dystrophic yellowed discolored nail plates with subungual debris  Assessment:   1. Pain due to onychomycosis of toenails of both feet      Plan:  Patient was evaluated and treated and all questions answered.  Discussed the etiology and treatment options for the condition in detail with the patient.  Last debridement was helpful.  Recommended debridement of the nails today. Sharp and mechanical debridement performed of all painful and mycotic nails today. Nails debrided in length and thickness using a nail nipper to level of comfort. Discussed treatment options including appropriate shoe gear. Follow up as needed for painful nails.    Return in about 3 months (around 04/17/2023) for painful thick fungal nails.

## 2023-02-24 ENCOUNTER — Emergency Department (HOSPITAL_COMMUNITY)
Admission: EM | Admit: 2023-02-24 | Discharge: 2023-02-24 | Disposition: A | Discharge status: Home / Self Care | Payer: Medicare Other | Attending: Emergency Medicine | Admitting: Emergency Medicine

## 2023-02-24 ENCOUNTER — Emergency Department (HOSPITAL_COMMUNITY): Payer: Medicare Other

## 2023-02-24 ENCOUNTER — Encounter (HOSPITAL_COMMUNITY): Payer: Self-pay | Admitting: Emergency Medicine

## 2023-02-24 ENCOUNTER — Other Ambulatory Visit: Payer: Self-pay

## 2023-02-24 DIAGNOSIS — R918 Other nonspecific abnormal finding of lung field: Secondary | ICD-10-CM | POA: Diagnosis not present

## 2023-02-24 DIAGNOSIS — J441 Chronic obstructive pulmonary disease with (acute) exacerbation: Secondary | ICD-10-CM | POA: Insufficient documentation

## 2023-02-24 DIAGNOSIS — W01198A Fall on same level from slipping, tripping and stumbling with subsequent striking against other object, initial encounter: Secondary | ICD-10-CM | POA: Diagnosis not present

## 2023-02-24 DIAGNOSIS — Z20822 Contact with and (suspected) exposure to covid-19: Secondary | ICD-10-CM | POA: Insufficient documentation

## 2023-02-24 DIAGNOSIS — W19XXXA Unspecified fall, initial encounter: Secondary | ICD-10-CM | POA: Diagnosis not present

## 2023-02-24 DIAGNOSIS — S81812A Laceration without foreign body, left lower leg, initial encounter: Secondary | ICD-10-CM | POA: Insufficient documentation

## 2023-02-24 DIAGNOSIS — S81802A Unspecified open wound, left lower leg, initial encounter: Secondary | ICD-10-CM | POA: Diagnosis not present

## 2023-02-24 DIAGNOSIS — J984 Other disorders of lung: Secondary | ICD-10-CM | POA: Diagnosis not present

## 2023-02-24 DIAGNOSIS — R0602 Shortness of breath: Secondary | ICD-10-CM | POA: Diagnosis not present

## 2023-02-24 DIAGNOSIS — R059 Cough, unspecified: Secondary | ICD-10-CM | POA: Diagnosis not present

## 2023-02-24 DIAGNOSIS — R0902 Hypoxemia: Secondary | ICD-10-CM | POA: Diagnosis not present

## 2023-02-24 DIAGNOSIS — F0390 Unspecified dementia without behavioral disturbance: Secondary | ICD-10-CM | POA: Diagnosis not present

## 2023-02-24 DIAGNOSIS — D72829 Elevated white blood cell count, unspecified: Secondary | ICD-10-CM | POA: Insufficient documentation

## 2023-02-24 LAB — CBC WITH DIFFERENTIAL/PLATELET
Abs Immature Granulocytes: 0.05 10*3/uL (ref 0.00–0.07)
Basophils Absolute: 0 10*3/uL (ref 0.0–0.1)
Basophils Relative: 0 %
Eosinophils Absolute: 0.1 10*3/uL (ref 0.0–0.5)
Eosinophils Relative: 1 %
HCT: 33.5 % — ABNORMAL LOW (ref 36.0–46.0)
Hemoglobin: 10.9 g/dL — ABNORMAL LOW (ref 12.0–15.0)
Immature Granulocytes: 1 %
Lymphocytes Relative: 7 %
Lymphs Abs: 0.8 10*3/uL (ref 0.7–4.0)
MCH: 31.1 pg (ref 26.0–34.0)
MCHC: 32.5 g/dL (ref 30.0–36.0)
MCV: 95.7 fL (ref 80.0–100.0)
Monocytes Absolute: 0.9 10*3/uL (ref 0.1–1.0)
Monocytes Relative: 9 %
Neutro Abs: 8.9 10*3/uL — ABNORMAL HIGH (ref 1.7–7.7)
Neutrophils Relative %: 82 %
Platelets: 307 10*3/uL (ref 150–400)
RBC: 3.5 MIL/uL — ABNORMAL LOW (ref 3.87–5.11)
RDW: 13.2 % (ref 11.5–15.5)
WBC: 10.7 10*3/uL — ABNORMAL HIGH (ref 4.0–10.5)
nRBC: 0 % (ref 0.0–0.2)

## 2023-02-24 LAB — BASIC METABOLIC PANEL
Anion gap: 10 (ref 5–15)
BUN: 21 mg/dL (ref 8–23)
CO2: 23 mmol/L (ref 22–32)
Calcium: 10.1 mg/dL (ref 8.9–10.3)
Chloride: 103 mmol/L (ref 98–111)
Creatinine, Ser: 1.09 mg/dL — ABNORMAL HIGH (ref 0.44–1.00)
GFR, Estimated: 51 mL/min — ABNORMAL LOW (ref >60)
Glucose, Bld: 158 mg/dL — ABNORMAL HIGH (ref 70–99)
Potassium: 3.7 mmol/L (ref 3.5–5.1)
Sodium: 136 mmol/L (ref 135–145)

## 2023-02-24 LAB — TROPONIN I (HIGH SENSITIVITY)
Troponin I (High Sensitivity): 3 ng/L (ref <18)
Troponin I (High Sensitivity): 3 ng/L (ref <18)

## 2023-02-24 LAB — SARS CORONAVIRUS 2 BY RT PCR: SARS Coronavirus 2 by RT PCR: NEGATIVE

## 2023-02-24 MED ORDER — AZITHROMYCIN 250 MG PO TABS: 250.0000 mg | ORAL_TABLET | Freq: Every day | ORAL | 0 refills | Status: DC

## 2023-02-24 MED ORDER — ALBUTEROL SULFATE HFA 108 (90 BASE) MCG/ACT IN AERS: 1.0000 | INHALATION_SPRAY | Freq: Four times a day (QID) | RESPIRATORY_TRACT | 0 refills | Status: DC | PRN

## 2023-02-24 MED ORDER — PREDNISONE 50 MG PO TABS
60.0000 mg | ORAL_TABLET | Freq: Once | ORAL | Status: AC
  Administered 2023-02-24: 60 mg via ORAL
  Filled 2023-02-24: qty 1

## 2023-02-24 MED ORDER — PREDNISONE 20 MG PO TABS: 60.0000 mg | ORAL_TABLET | Freq: Every day | ORAL | 0 refills | Status: DC

## 2023-02-24 MED ORDER — SODIUM CHLORIDE 0.9 % IV BOLUS
1000.0000 mL | Freq: Once | INTRAVENOUS | Status: AC
Start: 2023-02-24 — End: 2023-02-24
  Administered 2023-02-24: 1000 mL via INTRAVENOUS

## 2023-02-24 MED ORDER — AZITHROMYCIN 250 MG PO TABS
500.0000 mg | ORAL_TABLET | Freq: Once | ORAL | Status: AC
  Administered 2023-02-24: 500 mg via ORAL
  Filled 2023-02-24: qty 2

## 2023-02-24 NOTE — ED Triage Notes (Signed)
Pt sent for UC for evaluation of SOB, Cough, left leg wound from unwitnessed fall this morning, per family pt has history of emphysema and dementia.

## 2023-02-24 NOTE — ED Provider Notes (Signed)
[place] EMERGENCY DEPARTMENT AT [facility] Provider Note   CSN: [id number] Arrival date & time: 02/24/23  1603     History {Add pertinent medical, surgical, social history, OB history to HPI:1} Chief Complaint  Patient presents with   Shortness of Breath    Sophia Coleman is a 82 y.o. female.  Is a history of dementia and lives alone.  Son and daughter are bringing her into the hospital for evaluation of cough that is been going on for few days along with fall today where she struck her left lower leg.  She has a skin tear there.  She went to urgent care where they said her oxygen was low and her blood pressure was low and felt she needed to come here for further evaluation.  Patient does endorse a cough, she is not sure if it has been productive.  She does not think she has had a fever.  Denies nausea or vomiting or urinary symptoms.  She does smoke.  Family states she does not eat very much at baseline.  Has someone who checks on her every day.  The history is provided by the patient and a relative.  Cough Cough characteristics:  Unable to specify Severity:  Moderate Onset quality:  Gradual Duration:  4 days Progression:  Unchanged Chronicity:  New Smoker: yes   Relieved by:  None tried Ineffective treatments:  None tried Associated symptoms: shortness of breath   Associated symptoms: no chest pain, no chills and no fever        Home Medications Prior to Admission medications   Medication Sig Start Date End Date Taking? Authorizing Provider  atorvastatin (LIPITOR) 10 MG tablet Take 10 mg by mouth daily. 06/11/21   [provider]  busPIRone (BUSPAR) 5 MG tablet Take 1 tablet (5 mg total) by mouth 2 (two) times daily. 01/15/23   Lomax, Amy, NP  ferrous sulfate 325 (65 FE) MG tablet Take 1 tablet (325 mg total) by mouth daily. 06/13/19   Pricilla Loveless, MD  losartan (COZAAR) 100 MG tablet Take 100 mg by mouth every morning.     [provider]   memantine (NAMENDA XR) 14 MG CP24 24 hr capsule Take 1 capsule (14 mg total) by mouth daily. 01/15/23   Lomax, Amy, NP  PARoxetine (PAXIL) 40 MG tablet Take 40 mg by mouth every morning.    [provider]      Allergies    Vicodin [hydrocodone-acetaminophen], Augmentin [amoxicillin-pot clavulanate], and Quinolones    Review of Systems   Review of Systems  Unable to perform ROS: Dementia  Constitutional:  Negative for chills and fever.  Respiratory:  Positive for cough and shortness of breath.   Cardiovascular:  Negative for chest pain.    Physical Exam Updated Vital Signs BP 108/60   Pulse 65   Temp 98.5 F (36.9 C) (Oral)   Resp 16   Ht 4\' 11"  (1.499 m)   Wt 43.3 kg   SpO2 96%   BMI 19.27 kg/m  Physical Exam Vitals and nursing note reviewed.  Constitutional:      General: She is not in acute distress.    Appearance: She is well-developed.  HENT:     Head: Normocephalic and atraumatic.  Eyes:     Conjunctiva/sclera: Conjunctivae normal.  Cardiovascular:     Rate and Rhythm: Normal rate and regular rhythm.     Heart sounds: No murmur heard. Pulmonary:     Effort: Pulmonary effort is  normal. No respiratory distress.     Breath sounds: Normal breath sounds.  Abdominal:     Palpations: Abdomen is soft.     Tenderness: There is no abdominal tenderness.  Musculoskeletal:        General: No swelling.     Cervical back: Neck supple.     Right lower leg: No edema.     Left lower leg: Tenderness present. No edema.     Comments: She has abrasions skin tear on her anterior left lower leg.  No calf tenderness or cords appreciated  Skin:    General: Skin is warm and dry.     Capillary Refill: Capillary refill takes less than 2 seconds.  Neurological:     General: No focal deficit present.     Mental Status: She is alert.     Motor: No weakness.     ED Results / Procedures / Treatments   Labs (all labs ordered are listed, but only abnormal results are  displayed) Labs Reviewed  CBC WITH DIFFERENTIAL/PLATELET - Abnormal; Notable for the following components:      Result Value   WBC 10.7 (*)    RBC 3.50 (*)    Hemoglobin 10.9 (*)    HCT 33.5 (*)    Neutro Abs 8.9 (*)    All other components within normal limits  SARS CORONAVIRUS 2 BY RT PCR  BASIC METABOLIC PANEL  TROPONIN I (HIGH SENSITIVITY)    EKG EKG Interpretation Date/Time:  Tuesday February 24 2023 16:27:10 EDT Ventricular Rate:  67 PR Interval:  136 QRS Duration:  78 QT Interval:  406 QTC Calculation: 429 R Axis:   83  Text Interpretation: Normal sinus rhythm Septal infarct (cited on or before 13-Jun-2019) Abnormal ECG When compared with ECG of 09-Dec-2021 14:17, No significant change since last tracing Confirmed by Meridee Score [phone]) on 02/24/2023 4:43:07 PM  Radiology No results found.  Procedures Procedures  {Document cardiac monitor, telemetry assessment procedure when appropriate:1}  Medications Ordered in ED Medications  sodium chloride 0.9 % bolus 1,000 mL (has no administration in time range)    ED Course/ Medical Decision Making/ A&P   {   Click here for ABCD2, HEART and other calculatorsREFRESH Note before signing :1}                              Medical Decision Making Amount and/or Complexity of Data Reviewed Labs: ordered. Radiology: ordered.   This patient complains of ***; this involves an extensive number of treatment Options and is a complaint that carries with it a high risk of complications and morbidity. The differential includes ***  I ordered, reviewed and interpreted labs, which included *** I ordered medication *** and reviewed PMP when indicated. I ordered imaging studies which included *** and I independently    visualized and interpreted imaging which showed *** Additional history obtained from *** Previous records obtained and reviewed *** I consulted *** and discussed lab and imaging findings and discussed  disposition.  Cardiac monitoring reviewed, *** Social determinants considered, *** Critical Interventions: ***  After the interventions stated above, I reevaluated the patient and found *** Admission and further testing considered, ***   {Document critical care time when appropriate:1} {Document review of labs and clinical decision tools ie heart score, Chads2Vasc2 etc:1}  {Document your independent review of radiology images, and any outside records:1} {Document your discussion with family members, caretakers, and with consultants:1} {Document social  determinants of health affecting pt's care:1} {Document your decision making why or why not admission, treatments were needed:1} Final Clinical Impression(s) / ED Diagnoses Final diagnoses:  None    Rx / DC Orders ED Discharge Orders     None

## 2023-02-24 NOTE — Discharge Instructions (Signed)
You were seen in the emergency department for worsening cough shortness of breath.  Your chest x-ray did not show definite pneumonia and your COVID swab was negative.  We are putting you on antibiotics and prednisone.  Please continue your oxygen and breathing treatments.  Return to the emergency department if any worsening or concerning symptoms

## 2023-08-28 ENCOUNTER — Encounter (HOSPITAL_COMMUNITY): Payer: Self-pay | Admitting: Internal Medicine

## 2023-10-07 ENCOUNTER — Encounter (HOSPITAL_COMMUNITY): Payer: Self-pay | Admitting: Internal Medicine

## 2023-10-14 ENCOUNTER — Telehealth: Payer: Self-pay | Admitting: Family Medicine

## 2023-10-14 NOTE — Telephone Encounter (Signed)
LVM and sent mychart msg informing pt of need to reschedule 10/15/23 appt - NP out

## 2023-10-15 ENCOUNTER — Encounter: Payer: Self-pay | Admitting: Family Medicine

## 2023-10-15 ENCOUNTER — Ambulatory Visit: Payer: Medicare Other | Admitting: Family Medicine

## 2023-10-15 ENCOUNTER — Ambulatory Visit: Admitting: Podiatry

## 2023-10-15 ENCOUNTER — Telehealth: Payer: Self-pay | Admitting: Family Medicine

## 2023-10-15 NOTE — Telephone Encounter (Signed)
 Pt called to schedule appt .  Appt Scheduled

## 2023-10-20 DIAGNOSIS — Z0001 Encounter for general adult medical examination with abnormal findings: Secondary | ICD-10-CM | POA: Diagnosis not present

## 2023-10-20 DIAGNOSIS — Z681 Body mass index (BMI) 19 or less, adult: Secondary | ICD-10-CM | POA: Diagnosis not present

## 2023-10-20 DIAGNOSIS — F03A0 Unspecified dementia, mild, without behavioral disturbance, psychotic disturbance, mood disturbance, and anxiety: Secondary | ICD-10-CM | POA: Diagnosis not present

## 2023-10-20 DIAGNOSIS — Z1331 Encounter for screening for depression: Secondary | ICD-10-CM | POA: Diagnosis not present

## 2023-10-20 DIAGNOSIS — D509 Iron deficiency anemia, unspecified: Secondary | ICD-10-CM | POA: Diagnosis not present

## 2023-10-20 DIAGNOSIS — R7309 Other abnormal glucose: Secondary | ICD-10-CM | POA: Diagnosis not present

## 2023-10-20 DIAGNOSIS — F17290 Nicotine dependence, other tobacco product, uncomplicated: Secondary | ICD-10-CM | POA: Diagnosis not present

## 2023-10-20 DIAGNOSIS — J449 Chronic obstructive pulmonary disease, unspecified: Secondary | ICD-10-CM | POA: Diagnosis not present

## 2023-10-20 DIAGNOSIS — I10 Essential (primary) hypertension: Secondary | ICD-10-CM | POA: Diagnosis not present

## 2024-01-26 ENCOUNTER — Encounter (HOSPITAL_COMMUNITY): Payer: Self-pay | Admitting: Internal Medicine

## 2024-03-24 ENCOUNTER — Other Ambulatory Visit: Payer: Self-pay

## 2024-03-24 DIAGNOSIS — F0280 Dementia in other diseases classified elsewhere without behavioral disturbance: Secondary | ICD-10-CM

## 2024-03-24 MED ORDER — MEMANTINE HCL ER 14 MG PO CP24: 14.0000 mg | ORAL_CAPSULE | Freq: Every day | ORAL | 3 refills | Status: DC

## 2024-05-09 ENCOUNTER — Encounter (HOSPITAL_COMMUNITY): Payer: Self-pay | Admitting: Internal Medicine

## 2024-05-23 ENCOUNTER — Encounter (HOSPITAL_COMMUNITY): Payer: Self-pay | Admitting: Internal Medicine

## 2024-05-23 ENCOUNTER — Encounter: Payer: Self-pay | Admitting: Family Medicine

## 2024-05-23 ENCOUNTER — Ambulatory Visit (INDEPENDENT_AMBULATORY_CARE_PROVIDER_SITE_OTHER)

## 2024-05-23 ENCOUNTER — Ambulatory Visit: Admitting: Podiatry

## 2024-05-23 ENCOUNTER — Ambulatory Visit: Admitting: Family Medicine

## 2024-05-23 VITALS — BP 148/70 | HR 69 | Ht 60.0 in | Wt 98.2 lb

## 2024-05-23 VITALS — Ht 60.0 in | Wt 98.2 lb

## 2024-05-23 DIAGNOSIS — S9032XA Contusion of left foot, initial encounter: Secondary | ICD-10-CM | POA: Diagnosis not present

## 2024-05-23 DIAGNOSIS — G301 Alzheimer's disease with late onset: Secondary | ICD-10-CM

## 2024-05-23 DIAGNOSIS — F32A Depression, unspecified: Secondary | ICD-10-CM

## 2024-05-23 DIAGNOSIS — F0280 Dementia in other diseases classified elsewhere without behavioral disturbance: Secondary | ICD-10-CM

## 2024-05-23 DIAGNOSIS — F419 Anxiety disorder, unspecified: Secondary | ICD-10-CM

## 2024-05-23 DIAGNOSIS — M7752 Other enthesopathy of left foot: Secondary | ICD-10-CM

## 2024-05-23 DIAGNOSIS — B351 Tinea unguium: Secondary | ICD-10-CM

## 2024-05-23 MED ORDER — MEMANTINE HCL ER 21 MG PO CP24: 21.0000 mg | ORAL_CAPSULE | Freq: Every day | ORAL | 3 refills | Status: AC

## 2024-05-23 MED ORDER — MEMANTINE HCL ER 14 MG PO CP24: 14.0000 mg | ORAL_CAPSULE | Freq: Every day | ORAL | 3 refills | Status: DC

## 2024-05-23 MED ORDER — BUSPIRONE HCL 5 MG PO TABS: 5.0000 mg | ORAL_TABLET | Freq: Two times a day (BID) | ORAL | 3 refills | Status: AC

## 2024-05-23 NOTE — Progress Notes (Signed)
 "   PATIENT: Sophia Coleman DOB: 14-Aug-1940  REASON FOR VISIT: follow up HISTORY FROM: patient  Chief Complaint  Patient presents with   Follow-up    Patient is in room 7 with daughter.  Patient is here for follow up appointment. Daughter and patient both state that long term memory is better than short term, and that sometimes she's slipping and forgetting long term.    HISTORY OF PRESENT ILLNESS:  05/23/2024 ALL:  Sophia Coleman returns for follow up for AD. She was last seen 12/2022. MMSE 15/30. We continued memantine  but switched to an extended release tablet and continued buspirone  5mg  BID PRN. Since, she reports feeling well. No significant changes. Sophia Coleman has noted some delusional thoughts, especially late at night. She calls her daughter asking when her late husband will be home from work. She continues to live alone. She has a caregiver that comes twice daily for medication admin and meals. Weight is stable. She has gained about 4-5lbs since last visit. She does not drive.   01/15/2023 ALL:  Sophia Coleman returns for follow up for AD. She was last seen 12/2021. MMSE 16/30. We continued memantine  10mg  BID and buspirone  5mg  BID PRN. Since, she reports doing well. She has only continued memantine  and buspirone  daily. She continues to live alone. She has a caregiver, Sophia Coleman, who comes daily. Son checks in on her in the evenings. She is eating well. Sleeping well. Mood seems stable. She had one episode where she had a panic attack in the setting of back pain. Her daughter gave an Ativan  that seemed to help. She reports BP is usually good at home. She continues regular follow up with PCP.   01/15/2022 ALL: Sophia returns for follow up for AD. She continues buspirone  and memantine . Her daughter reports that she is only taking these once instead of twice daily. She has a caregiver, Sophia Coleman, who comes every day from 11-2. She is much more repetitive in questioning. She is more irritable. She seems more paranoid at  times. She has taken paroxetine  40mg  daily for years. She is sleeping well. She seems more alert and clear in the afternoons and evenings. Mornings are harder for her. Weight is fairly stable. Her daughter does have concerns of her forgetting to eat. She keeps Boost for supplements. She is still performing ADLs independently but likes for someone to be with her when showering. She has found her keys several times and driven. Fortunately. No accidents or getting lost.   07/16/2021 ALL: Sophia Coleman returns for follow up for AD. We continued buspirone  5mg  BID and increased memantine  to 10mg  daily. She seems to have tolerated medication adjustments. No significant improvement in memory. She continues to have anxiety. She will call daughter in law most mornings telling her she is sick and needs the daughter in law to come over. She has stopped cooking as much. Sophia Coleman feels that she may be forgetting to eat. She has a good appetite when Sophia Coleman is with her. Eating more sweets. She has gained 3 pounds since last year. She was started on statin with PCP. Able to perform ADLs but will ask for help if someone is close. She is sleeping well. She naps more during the day. She is no longer driving. No falls. She reports that BP is usually ok at home. She forgot to take her BP meds yesterday.   01/07/2021 ALL:  Sophia Coleman returns for follow up for AD. She presents with her daughter, Sophia Coleman, who aids in history. We continued Buspar   twice daily and added memantine  5mg  BID at last visit 07/2020. Sophia Coleman reports that she has taken memantin 5mg  daily and is tolerating well. Sophia Coleman organizes her medicine container weekly and is concerned that Sophia Coleman will not be able to remember taking medicaitons twice daily. She leaves buspirone  on window seal and usually just takes it at night. Memory seems stable. She rarely drives. She is eating normally. Able to perform all ADLs. No falls.   07/24/2020 ALL:  She is doing fairly well, today. No significant  worsening. Her daughter does feel that short term memory continues to progress. She has had more concerns of anxiety attacks over the past month. Her daughter reports that she is calling her or her sister in law every morning stating that something is wrong. And that she needs help right away. By the time someone gets to her, she doesn't remember calling. It has been harder for her to shop independently. Her daughter is helping with grocery shopping. She rarely drives. She is seeing PCP annually. She continues Paxil  40mg  daily. She has had nightmares with Aricept. She reports trying Namenda  in the past but her daughter does not think she ever started it.   01/25/2020 ALL:  Sophia Coleman is a 83 y.o. female here today for follow up for memory loss.  We started her on a trial of BuSpar  5 mg at bedtime in March.  She does feel like this medication has helped significantly with her anxiety.  Her daughter presents with her today and is in agreement.  She feels that she is sleeping better.  She continues to be active.  She is driving without difficulty.  Driving is only in short, local, familiar areas.  Her daughter reports that she does have more irritability when performing difficult task.  She continues to assist her daughter with finances.  Her daughter reports that she will often become irritable and refused to help.  Short-term memory continues to be the biggest concern.  Otherwise, she seems to be doing very well.  HISTORY: (copied from my note on 07/25/2019)  Sophia Coleman is a 83 y.o. female here today for follow up for memory loss. She tried Namenda  but did not like how she felt on it and stopped. She feels that it caused nightmares. She also had nightmares on Aricept. Her daughter is with her today and aids in history. She is doing fairly well. She continues to live alone. She is able to perform all ADL's independently. She is eating well. She continues to note anxiety and depression. She continues Paxil   daily prescribed by PCP. Her daughter reports that she is having more panic symptoms recently. She gets worked up very quickly. She has family that live across the street and can come when she needs help. They have had to call EMS recently due to panic attacks. She has taken Xanax in the past for anxiety that does help but patient and daughter are hesitant to continue medication.   HISTORY: (copied from my note on 01/18/2019)   Sophia Coleman is a 83 y.o. female here today for follow up for concerns of memory loss. She has tried Aricept in the past with concerns unwanted dreams. She was advised to consider Namenda  at last visit.  She feels that this may be a reasonable medication to consider.  Admits to suffering from depression.  She has been on Paxil  40 mg for many years.  She has attempted to switch to a different SSRI in the past  but feels that she was unable to tolerate increased anxiety.  She lives at home alone.  She is able to perform all ADLs.  She is able to dose her own medications.  She does have family members that check on her regularly and live close by.  She continues to drive locally and short distances.  There have been no concerns of accidents or getting lost.  No falls at home.   HISTORY: (copied from Dr Chancy note on 09/16/2018)   83 year old female here for evaluation of memory loss and confusion.   Patient has had 6 to 12 months of mild short-term memory loss, forgetting recent conversations and events, names of grandchildren, without change in ADLs.   In April 2020 she had a more severe change in symptoms.  She had anxiety, shortness of breath, lightheadedness and confusion.  Patient went to the hospital on 09/01/2018 for evaluation.  Lab testing were unremarkable.  She was discharged home.   09/03/2018 patient continued to have symptoms and was very confused and amnestic to her first emergency room visit.  Therefore she went back to the emergency room and had CT scan of the  head which was unremarkable.   Since that time patient is doing little bit better.   Patient typically lives alone.  Currently her family is with her.  She normally is able to take care of all of her ADLs.   Her PCP started her on Aricept 5 mg, but she took this dose and had severe crazy dreams, and she stopped the medication.   Patient has history of emphysema, hypertension, heart disease, carotid artery disease status post endarterectomy, ongoing tobacco abuse.   REVIEW OF SYSTEMS: Out of a complete 14 system review of symptoms, the patient complains only of the following symptoms, memory loss, anxiety and all other reviewed systems are negative.   ALLERGIES: Allergies  Allergen Reactions   Vicodin [Hydrocodone-Acetaminophen ] Nausea And Vomiting   Augmentin [Amoxicillin-Pot Clavulanate] Other (See Comments)    SEVERE SWEATS Has patient had a PCN reaction causing immediate rash, facial/tongue/throat swelling, SOB or lightheadedness with hypotension: No Has patient had a PCN reaction causing severe rash involving mucus membranes or skin necrosis: No Has patient had a PCN reaction that required hospitalization: No Has patient had a PCN reaction occurring within the last 10 years: Yes If all of the above answers are NO, then may proceed with Cephalosporin use.    Quinolones Nausea And Vomiting    HOME MEDICATIONS: Outpatient Medications Prior to Visit  Medication Sig Dispense Refill   atorvastatin (LIPITOR) 10 MG tablet Take 10 mg by mouth daily.     ferrous sulfate  325 (65 FE) MG tablet Take 1 tablet (325 mg total) by mouth daily. 30 tablet 0   losartan  (COZAAR ) 100 MG tablet Take 100 mg by mouth every morning.      PARoxetine  (PAXIL ) 40 MG tablet Take 40 mg by mouth every morning.     busPIRone  (BUSPAR ) 5 MG tablet Take 1 tablet (5 mg total) by mouth 2 (two) times daily. 180 tablet 3   memantine  (NAMENDA  XR) 14 MG CP24 24 hr capsule Take 1 capsule (14 mg total) by mouth  daily. 90 capsule 3   albuterol  (VENTOLIN  HFA) 108 (90 Base) MCG/ACT inhaler Inhale 1-2 puffs into the lungs every 6 (six) hours as needed for wheezing or shortness of breath. 8 g 0   azithromycin  (ZITHROMAX ) 250 MG tablet Take 1 tablet (250 mg total) by mouth daily. 4 tablet  0   predniSONE  (DELTASONE ) 20 MG tablet Take 3 tablets (60 mg total) by mouth daily. 12 tablet 0   No facility-administered medications prior to visit.    PAST MEDICAL HISTORY: Past Medical History:  Diagnosis Date   Anemia    Anxiety    Arthritis    Carotid artery occlusion    Chronic kidney disease    Colon polyps    COPD (chronic obstructive pulmonary disease) (HCC)    Dementia (HCC)    Depression    Dyspnea    Heart murmur    Hypertension    Memory difficulty    Pneumonia    PONV (postoperative nausea and vomiting)     PAST SURGICAL HISTORY: Past Surgical History:  Procedure Laterality Date   CAROTID ENDARTERECTOMY     CATARACT EXTRACTION W/PHACO Left 01/02/2014   Procedure: CATARACT EXTRACTION PHACO AND INTRAOCULAR LENS PLACEMENT (IOC);  Surgeon: Cherene Mania, MD;  Location: [facility];  Service: Ophthalmology;  Laterality: Left;  CDE 8.17   CATARACT EXTRACTION W/PHACO Right 03/06/2014   Procedure: CATARACT EXTRACTION PHACO AND INTRAOCULAR LENS PLACEMENT (IOC);  Surgeon: Cherene Mania, MD;  Location: [facility];  Service: Ophthalmology;  Laterality: Right;  CDE:15.14   CHOLECYSTECTOMY     COLONOSCOPY  11/13/2011   Procedure: COLONOSCOPY;  Surgeon: Claudis RAYMOND Rivet, MD;  Location: [facility];  Service: Endoscopy;  Laterality: N/A;  1030   COLONOSCOPY N/A 01/22/2017   Procedure: COLONOSCOPY;  Surgeon: Rivet Claudis RAYMOND, MD;  Location: [facility];  Service: Endoscopy;  Laterality: N/A;  1030   ENDARTERECTOMY Left 04/02/2016   Procedure: ENDARTERECTOMY LEFT CAROTID ARTERY;  Surgeon: Krystal JULIANNA Doing, MD;  Location[facility] OR;  Service: Vascular;  Laterality: Left;   PATCH ANGIOPLASTY Left 04/02/2016   Procedure: PATCH  ANGIOPLASTY LEFT CAROTID ARTERY USING HEMASHIELD PLATINUM FINESS PATCH;  Surgeon: Krystal JULIANNA Doing, MD;  Location: [facility] OR;  Service: Vascular;  Laterality: Left;    FAMILY HISTORY: Family History  Problem Relation Age of Onset   Heart murmur Mother    Heart Problems Mother    Heart disease Mother    Hypertension Mother    Glaucoma Mother    Emphysema Father        COPD   Cancer Sister    Lung cancer Sister    Dementia Brother    Stroke Maternal Grandmother    Heart attack Maternal Grandfather    Kidney disease Paternal Grandfather    Colon cancer Neg Hx     SOCIAL HISTORY: Social History   Socioeconomic History   Marital status: Widowed    Spouse name: Not on file   Number of children: 2   Years of education: Not on file   Highest education level: Not on file  Occupational History    Comment: retired  Tobacco Use   Smoking status: Every Day    Current packs/day: 1.00    Average packs/day: 1 pack/day for 50.0 years (50.0 ttl pk-yrs)    Types: Cigarettes   Smokeless tobacco: Never  Vaping Use   Vaping status: Never Used  Substance and Sexual Activity   Alcohol use: No   Drug use: No   Sexual activity: Not on file  Other Topics Concern   Not on file  Social History Narrative   Lives alone   Patient is retired.    Patient has two caregivers, one at noon and one at 5:30pm in addition to daughter and son.    Social Drivers of Health  Tobacco Use: High Risk (05/23/2024)   Patient History    Smoking Tobacco Use: Every Day    Smokeless Tobacco Use: Never    Passive Exposure: Not on file  Financial Resource Strain: Not on file  Food Insecurity: Not on file  Transportation Needs: Not on file  Physical Activity: Not on file  Stress: Not on file  Social Connections: Not on file  Intimate Partner Violence: Not on file  Depression (EYV7-0): Not on file  Alcohol Screen: Not on file  Housing: Not on file  Utilities: Not on file  Health Literacy: Not on file       PHYSICAL EXAM  Vitals:   05/23/24 1359  BP: (!) 148/70  Pulse: 69  Weight: 98 lb 3.2 oz (44.5 kg)  Height: 5' (1.524 m)     Body mass index is 19.18 kg/m.  Generalized: Well developed, in no acute distress  Cardiology: normal rate and rhythm, no murmur noted Respiratory: clear to auscultation bilaterally  Neurological examination  Mentation: Alert oriented to time, place, history taking. Follows all commands speech and language fluent Cranial nerve II-XII: Pupils were equal round reactive to light. Extraocular movements were full, visual field were full on confrontational test. Facial sensation and strength were normal. Head turning and shoulder shrug  were normal and symmetric. Motor: The motor testing reveals 5 over 5 strength of all 4 extremities. Good symmetric motor tone is noted throughout.  Gait and station: Gait is normal.    DIAGNOSTIC DATA (LABS, IMAGING, TESTING) - I reviewed patient records, labs, notes, testing and imaging myself where available.     05/23/2024    2:12 PM 01/15/2023    2:56 PM 01/15/2022    1:38 PM  MMSE - Mini Mental State Exam  Orientation to time 0 0 0  Orientation to Place 2 3 5   Registration 3 3 3   Attention/ Calculation 0 1 0  Recall 0 0 0  Language- name 2 objects 2 2 2   Language- repeat 1 1 1   Language- follow 3 step command 3 3 3   Language- read & follow direction 1 1 1   Write a sentence 1 1 1   Copy design 0 0 0  Total score 13 15 16      Lab Results  Component Value Date   WBC 10.7 (H) 02/24/2023   HGB 10.9 (L) 02/24/2023   HCT 33.5 (L) 02/24/2023   MCV 95.7 02/24/2023   PLT 307 02/24/2023      Component Value Date/Time   NA 136 02/24/2023 1634   K 3.7 02/24/2023 1634   CL 103 02/24/2023 1634   CO2 23 02/24/2023 1634   GLUCOSE 158 (H) 02/24/2023 1634   BUN 21 02/24/2023 1634   CREATININE 1.09 (H) 02/24/2023 1634   CREATININE 0.89 06/23/2019 1308   CALCIUM 10.1 02/24/2023 1634   PROT 6.8 12/15/2021 0558    ALBUMIN 3.6 12/15/2021 0558   AST 19 12/15/2021 0558   ALT 13 12/15/2021 0558   ALKPHOS 64 12/15/2021 0558   BILITOT 0.4 12/15/2021 0558   GFRNONAA 51 (L) 02/24/2023 1634   GFRNONAA 63 06/23/2019 1308   GFRAA >60 07/05/2019 1919   GFRAA 72 06/23/2019 1308   No results found for: CHOL, HDL, LDLCALC, LDLDIRECT, TRIG, CHOLHDL No results found for: YHAJ8R Lab Results  Component Value Date   VITAMINB12 357 06/16/2017   No results found for: TSH     ASSESSMENT AND PLAN 83 y.o. year old female  has a past medical history  of Anemia, Anxiety, Arthritis, Carotid artery occlusion, Chronic kidney disease, Colon polyps, COPD (chronic obstructive pulmonary disease) (HCC), Dementia (HCC), Depression, Dyspnea, Heart murmur, Hypertension, Memory difficulty, Pneumonia, and PONV (postoperative nausea and vomiting). here with     ICD-10-CM   1. Late onset Alzheimer's dementia without behavioral disturbance (HCC)  G30.1 DISCONTINUED: memantine  (NAMENDA  XR) 14 MG CP24 24 hr capsule   F02.80     2. Anxiety and depression  F41.9    F32.A        Sophia Coleman is doing fairly well, today. Memory has been stable. MMSE stable, 13/30, previously 15/30. Anxiety seems better on buspirone . We will continue 5mg  up to twice daily. May increase in the future if needed. We will continue memantine  but will increase extended release dose to 21mg  daily. Her daughter will help with medication administration and monitor for side effects. We will monitor for worsening delusions/paranoia. May consider psychiatry if needed. She will stay well-hydrated and eat well-balanced meals. Advised not to drive. She will continue follow-up with primary care as directed.  She will follow-up with me in 6 months, sooner if needed.  She verbalizes understanding and agreement with this plan.   No orders of the defined types were placed in this encounter.     Meds ordered this encounter  Medications   DISCONTD: memantine   (NAMENDA  XR) 14 MG CP24 24 hr capsule    Sig: Take 1 capsule (14 mg total) by mouth daily.    Dispense:  90 capsule    Refill:  3    Supervising Provider:   YAN, YIJUN [[id number]]   busPIRone  (BUSPAR ) 5 MG tablet    Sig: Take 1 tablet (5 mg total) by mouth 2 (two) times daily.    Dispense:  180 tablet    Refill:  3    Supervising Provider:   YAN, YIJUN [[id number]]   memantine  (NAMENDA  XR) 21 MG CP24 24 hr capsule    Sig: Take 1 capsule (21 mg total) by mouth daily.    Dispense:  90 capsule    Refill:  3    Please discontinue 14mg  dose.    Supervising Provider:   YAN, YIJUN [[id number]]     I spent 30 minutes of face-to-face and non-face-to-face time with patient.  This included previsit chart review, lab review, study review, order entry, electronic health record documentation, patient education.   Greig Forbes, FNP-C 05/23/2024, 3:54 PM [facility] [address], [address] [place], [place] [address] [phone] "

## 2024-05-23 NOTE — Patient Instructions (Signed)
 Below is our plan:  We will increase memantine  XR to 21mg  daily. Continue buspirone  5mg  twice daily. May increase further if needed. I will place a referral to palliative as discussed.   Please make sure you are staying well hydrated. I recommend 50-60 ounces daily. Well balanced diet and regular exercise encouraged. Consistent sleep schedule with 6-8 hours recommended.   Please continue follow up with care team as directed.   Follow up with me in 6 months  You may receive a survey regarding today's visit. I encourage you to leave honest feed back as I do use this information to improve patient care. Thank you for seeing me today!   Management of Memory Problems   There are some general things you can do to help manage your memory problems.  Your memory may not in fact recover, but by using techniques and strategies you will be able to manage your memory difficulties better.   1)  Establish a routine. Try to establish and then stick to a regular routine.  By doing this, you will get used to what to expect and you will reduce the need to rely on your memory.  Also, try to do things at the same time of day, such as taking your medication or checking your calendar first thing in the morning. Think about think that you can do as a part of a regular routine and make a list.  Then enter them into a daily planner to remind you.  This will help you establish a routine.   2)  Organize your environment. Organize your environment so that it is uncluttered.  Decrease visual stimulation.  Place everyday items such as keys or cell phone in the same place every day (ie.  Basket next to front door) Use post it notes with a brief message to yourself (ie. Turn off light, lock the door) Use labels to indicate where things go (ie. Which cupboards are for food, dishes, etc.) Keep a notepad and pen by the telephone to take messages   3)  Memory Aids A diary or journal/notebook/daily planner Making a list  (shopping list, chore list, to do list that needs to be done) Using an alarm as a reminder (kitchen timer or cell phone alarm) Using cell phone to store information (Notes, Calendar, Reminders) Calendar/White board placed in a prominent position Post-it notes   In order for memory aids to be useful, you need to have good habits.  It's no good remembering to make a note in your journal if you don't remember to look in it.  Try setting aside a certain time of day to look in journal.   4)  Improving mood and managing fatigue. There may be other factors that contribute to memory difficulties.  Factors, such as anxiety, depression and tiredness can affect memory. Regular gentle exercise can help improve your mood and give you more energy. Exercise: there are short videos created by the [employer] on Health specially for older adults: https://bit.ly/2I30q97.  Mediterranean diet: which emphasizes fruits, vegetables, whole grains, legumes, fish, and other seafood; unsaturated fats such as olive oils; and low amounts of red meat, eggs, and sweets. A variation of this, called MIND [facility] Intervention for Neurodegenerative Delay) incorporates the DASH (Dietary Approaches to Stop Hypertension) diet, which has been shown to lower high blood pressure, a risk factor for Alzheimers disease. More information at: exitmarketing.de.  Aerobic exercise that improve heart health is also good for the mind.  [employer] on Aging  have short videos for exercises that you can do at home: Blindworkshop.com.pt Simple relaxation techniques may help relieve symptoms of anxiety Try to get back to completing activities or hobbies you enjoyed doing in the past. Learn to pace yourself through activities to decrease fatigue. Find out about some local support groups where you can share experiences with others. Try and achieve 7-8  hours of sleep at night.   Resources for Family/Caregiver  Online caregiver support groups can be found at westerntunes.it or call Alzheimer's Association's 24/7 hotline: 613-554-3771. [facility] Counsel[facility] Program offers in-person, virtual support groups and individual counseling for both care partners and persons with memory loss. Call for more information at [phone].   Advanced care plan: there are two types of Power of Attorney: healthcare and durable. Healthcare POA is a designated person to make healthcare decisions on your behalf if you were too sick to make them yourself. This person can be selected and documented by your physician. Durable POA has to be set up with a lawyer who takes charge of your finances and estate if you were too sick or cognitively impaired to manage your finances accurately. You can find a local Elder Therapist, art here: newportranch.at.  Check out www.planyourlifespan.org, which will help you plan before a crisis and decide who will take care of life considerations in a circumstance where you may not be able to speak for yourself.   Helpful books (available on [employer] or your local bookstore):  By Dr. Katherene Gentry: Keeping Love Alive as Memories Fade: The 5 Love Languages and the Alzheimer's Journey Feb 24, 2015 The Dementia Care Partner's Workbook: A Guide for Understanding, Education, and [employer] - October 24, 2017.  Both available for less than $15.   Coping with behavior change in dementia: a family caregiver's guide by Landry Mirza & Mitzie Pizza A Caregiver's Guide to Dementia: Using Activities and Other Strategies to Prevent, Reduce and Manage Behavioral Symptoms by Leita SAILOR. [place] and Catherine Piersol.  Creating Moments of Joy for the Person with Alzheimer's or Dementia 4th edition by Melanie Mings  Caregiver videos on common behaviors related to dementia: populationgame.pl  [place] Caregiver Portal:  free to sign up, links to local resources: [email or web address]

## 2024-05-25 NOTE — Progress Notes (Signed)
"  °  Subjective:  Patient ID: Sophia Coleman, female    DOB: 1940/06/08,  MRN: [id number]  Chief Complaint  Patient presents with   Nail Problem    Rm RFC    83 y.o. female presents with the above complaint. History confirmed with patient.  Her nails are thickened elongated discolored she and her daughter are unable to cut them, prior debridement was helpful in reducing pain and improving function.  She also had a new issue that developed a couple days ago with painful swelling and redness on the top of her left foot, does not recall think she may have rubbed something over the weekend  Objective:  Physical Exam: warm, good capillary refill, no trophic changes or ulcerative lesions, normal DP and PT pulses, and normal sensory exam. Left Foot: dystrophic yellowed discolored nail plates with subungual debris.  Painful erythematous area dorsal midfoot, no fluctuance or hematoma, no open ulceration no ascending cellulitis erythema is localized to tender area in the dorsal midfoot Right Foot: dystrophic yellowed discolored nail plates with subungual debris   Radiographs taken today shows midfoot arthritis no fracture or stress fracture or acute osseous abnormalities   Assessment:   1. Contusion of left foot, initial encounter   2. Pain due to onychomycosis of toenails of both feet      Plan:  Patient was evaluated and treated and all questions answered.  Discussed the etiology and treatment options for the condition in detail with the patient.  Last debridement was helpful.  Recommended debridement of the nails today. Sharp and mechanical debridement performed of all painful and mycotic nails today. Nails debrided in length and thickness using a nail nipper to level of comfort. Discussed treatment options including appropriate shoe gear. Follow up as needed for painful nails.  Reviewed the x-rays taken today and she likely had a contusion of the foot from occult trauma that is not realize it  at the time there is no evidence of expanding hematoma fracture or stress fracture, recommended RICE protocol and if not improving in the next week to call for reappointment.  Return if symptoms worsen or fail to improve.   "

## 2024-12-13 ENCOUNTER — Ambulatory Visit: Admitting: Family Medicine
